# Patient Record
Sex: Male | Born: 1965
Health system: Southern US, Community
[De-identification: ages and names within clinical notes are randomized; demographics above are authoritative.]

## PROBLEM LIST (undated history)

## (undated) DIAGNOSIS — K3189 Other diseases of stomach and duodenum: Secondary | ICD-10-CM

## (undated) DIAGNOSIS — E119 Type 2 diabetes mellitus without complications: Secondary | ICD-10-CM

## (undated) DIAGNOSIS — F4310 Post-traumatic stress disorder, unspecified: Secondary | ICD-10-CM

## (undated) DIAGNOSIS — K922 Gastrointestinal hemorrhage, unspecified: Secondary | ICD-10-CM

## (undated) DIAGNOSIS — K449 Diaphragmatic hernia without obstruction or gangrene: Secondary | ICD-10-CM

## (undated) DIAGNOSIS — K859 Acute pancreatitis without necrosis or infection, unspecified: Secondary | ICD-10-CM

## (undated) DIAGNOSIS — I10 Essential (primary) hypertension: Secondary | ICD-10-CM

## (undated) DIAGNOSIS — I864 Gastric varices: Secondary | ICD-10-CM

## (undated) DIAGNOSIS — K7460 Unspecified cirrhosis of liver: Secondary | ICD-10-CM

## (undated) DIAGNOSIS — G4700 Insomnia, unspecified: Secondary | ICD-10-CM

## (undated) DIAGNOSIS — F1011 Alcohol abuse, in remission: Secondary | ICD-10-CM

## (undated) DIAGNOSIS — R911 Solitary pulmonary nodule: Secondary | ICD-10-CM

## (undated) DIAGNOSIS — F419 Anxiety disorder, unspecified: Secondary | ICD-10-CM

## (undated) DIAGNOSIS — K863 Pseudocyst of pancreas: Secondary | ICD-10-CM

## (undated) DIAGNOSIS — K219 Gastro-esophageal reflux disease without esophagitis: Secondary | ICD-10-CM

## (undated) DIAGNOSIS — R188 Other ascites: Secondary | ICD-10-CM

## (undated) DIAGNOSIS — Z5189 Encounter for other specified aftercare: Secondary | ICD-10-CM

## (undated) DIAGNOSIS — K766 Portal hypertension: Secondary | ICD-10-CM

## (undated) HISTORY — DX: Insomnia, unspecified: G47.00

## (undated) HISTORY — DX: Diaphragmatic hernia without obstruction or gangrene: K44.9

## (undated) HISTORY — PX: SPLENECTOMY: SUR1306

## (undated) HISTORY — PX: CATARACT EXTRACTION: SUR2

## (undated) HISTORY — PX: FEMUR SURGERY: SHX943

## (undated) HISTORY — DX: Encounter for other specified aftercare: Z51.89

---

## 1999-10-15 ENCOUNTER — Emergency Department (HOSPITAL_COMMUNITY): Admission: EM | Admit: 1999-10-15 | Discharge: 1999-10-15 | Payer: Self-pay | Admitting: Emergency Medicine

## 1999-10-15 ENCOUNTER — Encounter: Payer: Self-pay | Admitting: Emergency Medicine

## 2013-12-01 ENCOUNTER — Emergency Department (HOSPITAL_BASED_OUTPATIENT_CLINIC_OR_DEPARTMENT_OTHER): Payer: BC Managed Care – PPO

## 2013-12-01 ENCOUNTER — Emergency Department (HOSPITAL_BASED_OUTPATIENT_CLINIC_OR_DEPARTMENT_OTHER)
Admission: EM | Admit: 2013-12-01 | Discharge: 2013-12-01 | Disposition: A | Discharge status: Other Acute Inpatient Hospital | Payer: BC Managed Care – PPO | Attending: Emergency Medicine | Admitting: Emergency Medicine

## 2013-12-01 ENCOUNTER — Encounter (HOSPITAL_BASED_OUTPATIENT_CLINIC_OR_DEPARTMENT_OTHER): Payer: Self-pay | Admitting: Emergency Medicine

## 2013-12-01 DIAGNOSIS — N390 Urinary tract infection, site not specified: Secondary | ICD-10-CM | POA: Insufficient documentation

## 2013-12-01 DIAGNOSIS — F17200 Nicotine dependence, unspecified, uncomplicated: Secondary | ICD-10-CM | POA: Insufficient documentation

## 2013-12-01 DIAGNOSIS — K859 Acute pancreatitis without necrosis or infection, unspecified: Secondary | ICD-10-CM | POA: Insufficient documentation

## 2013-12-01 DIAGNOSIS — M549 Dorsalgia, unspecified: Secondary | ICD-10-CM | POA: Insufficient documentation

## 2013-12-01 LAB — CBC WITH DIFFERENTIAL/PLATELET
BASOS ABS: 0 10*3/uL (ref 0.0–0.1)
Basophils Relative: 0 % (ref 0–1)
Eosinophils Absolute: 0 10*3/uL (ref 0.0–0.7)
Eosinophils Relative: 0 % (ref 0–5)
HEMATOCRIT: 41.9 % (ref 39.0–52.0)
HEMOGLOBIN: 15.1 g/dL (ref 13.0–17.0)
LYMPHS PCT: 6 % — AB (ref 12–46)
Lymphs Abs: 0.5 10*3/uL — ABNORMAL LOW (ref 0.7–4.0)
MCH: 36.4 pg — ABNORMAL HIGH (ref 26.0–34.0)
MCHC: 36 g/dL (ref 30.0–36.0)
MCV: 101 fL — ABNORMAL HIGH (ref 78.0–100.0)
MONOS PCT: 9 % (ref 3–12)
Monocytes Absolute: 0.7 10*3/uL (ref 0.1–1.0)
NEUTROS ABS: 6.3 10*3/uL (ref 1.7–7.7)
NEUTROS PCT: 85 % — AB (ref 43–77)
Platelets: 106 10*3/uL — ABNORMAL LOW (ref 150–400)
RBC: 4.15 MIL/uL — ABNORMAL LOW (ref 4.22–5.81)
RDW: 13 % (ref 11.5–15.5)
WBC: 7.5 10*3/uL (ref 4.0–10.5)

## 2013-12-01 LAB — URINALYSIS, ROUTINE W REFLEX MICROSCOPIC
GLUCOSE, UA: NEGATIVE mg/dL
Ketones, ur: >80 mg/dL — AB
Nitrite: POSITIVE — AB
PH: 6 (ref 5.0–8.0)
Protein, ur: >300 mg/dL — AB
Urobilinogen, UA: 1 mg/dL (ref 0.0–1.0)

## 2013-12-01 LAB — COMPREHENSIVE METABOLIC PANEL
ALBUMIN: 4.9 g/dL (ref 3.5–5.2)
ALK PHOS: 66 U/L (ref 39–117)
ALT: 81 U/L — AB (ref 0–53)
AST: 120 U/L — AB (ref 0–37)
BUN: 23 mg/dL (ref 6–23)
CHLORIDE: 90 meq/L — AB (ref 96–112)
CO2: 20 mEq/L (ref 19–32)
Calcium: 10.3 mg/dL (ref 8.4–10.5)
Creatinine, Ser: 1 mg/dL (ref 0.50–1.35)
GFR calc Af Amer: >90 mL/min (ref >90)
GFR calc non Af Amer: 88 mL/min — ABNORMAL LOW (ref >90)
Glucose, Bld: 177 mg/dL — ABNORMAL HIGH (ref 70–99)
Potassium: 4.3 mEq/L (ref 3.7–5.3)
SODIUM: 133 meq/L — AB (ref 137–147)
Total Bilirubin: 1.5 mg/dL — ABNORMAL HIGH (ref 0.3–1.2)
Total Protein: 8.4 g/dL — ABNORMAL HIGH (ref 6.0–8.3)

## 2013-12-01 LAB — URINE MICROSCOPIC-ADD ON

## 2013-12-01 LAB — LIPASE, BLOOD

## 2013-12-01 MED ORDER — HYDROMORPHONE HCL PF 2 MG/ML IJ SOLN
INTRAMUSCULAR | Status: AC
  Administered 2013-12-01: 2 mg
  Filled 2013-12-01: qty 1

## 2013-12-01 MED ORDER — DEXTROSE 5 % IV SOLN
1.0000 g | INTRAVENOUS | Status: DC
  Administered 2013-12-01: 1 g via INTRAVENOUS

## 2013-12-01 MED ORDER — CEFTRIAXONE SODIUM 1 G IJ SOLR
INTRAMUSCULAR | Status: AC
  Filled 2013-12-01: qty 10

## 2013-12-01 MED ORDER — IOHEXOL 300 MG/ML  SOLN
50.0000 mL | Freq: Once | INTRAMUSCULAR | Status: AC | PRN
  Administered 2013-12-01: 50 mL via ORAL

## 2013-12-01 MED ORDER — ONDANSETRON HCL 4 MG/2ML IJ SOLN
4.0000 mg | Freq: Once | INTRAMUSCULAR | Status: AC
  Administered 2013-12-01: 4 mg via INTRAVENOUS
  Filled 2013-12-01: qty 2

## 2013-12-01 MED ORDER — SODIUM CHLORIDE 0.9 % IV SOLN
Freq: Once | INTRAVENOUS | Status: AC
  Administered 2013-12-01: 1000 mL via INTRAVENOUS

## 2013-12-01 MED ORDER — HYDROMORPHONE HCL PF 1 MG/ML IJ SOLN
1.0000 mg | Freq: Once | INTRAMUSCULAR | Status: AC
  Administered 2013-12-01: 1 mg via INTRAVENOUS
  Filled 2013-12-01: qty 1

## 2013-12-01 MED ORDER — PANTOPRAZOLE SODIUM 40 MG IV SOLR
40.0000 mg | Freq: Once | INTRAVENOUS | Status: AC
  Administered 2013-12-01: 40 mg via INTRAVENOUS
  Filled 2013-12-01: qty 40

## 2013-12-01 MED ORDER — IOHEXOL 300 MG/ML  SOLN
100.0000 mL | Freq: Once | INTRAMUSCULAR | Status: AC | PRN
  Administered 2013-12-01: 100 mL via INTRAVENOUS

## 2013-12-01 NOTE — ED Notes (Signed)
Epigastric pain since yesterday. Admits to drinking alcohol almost everyday. He has been vomiting green gastric contents.

## 2013-12-01 NOTE — ED Notes (Signed)
Called to RN at [facility]

## 2013-12-01 NOTE — ED Provider Notes (Signed)
CSN: [id number]     Arrival date & time 12/01/13  1758 History   First MD Initiated Contact with Patient 12/01/13 1806     Chief Complaint  Patient presents with  . Abdominal Pain  . Back Pain     (Consider location/radiation/quality/duration/timing/severity/associated sxs/prior Treatment) Patient is a 48 y.o. male presenting with abdominal pain. The history is provided by the patient. No language interpreter was used.  Abdominal Pain Pain location:  Epigastric and LUQ Pain quality: aching and stabbing   Pain radiates to:  Back Pain severity:  Moderate Onset quality:  Gradual Duration:  12 hours Progression:  Worsening Chronicity:  New Context: alcohol use   Relieved by:  Nothing Worsened by:  Nothing tried Associated symptoms: vomiting     History reviewed. No pertinent past medical history. Past Surgical History  Procedure Laterality Date  . Femur surgery     No family history on file. History  Substance Use Topics  . Smoking status: Current Every Day Smoker -- 0.50 packs/day    Types: Cigarettes  . Smokeless tobacco: Not on file  . Alcohol Use: Yes     Comment: drinks liquior everyday.     Review of Systems  Constitutional: Negative.   Respiratory: Negative.   Cardiovascular: Negative.   Gastrointestinal: Positive for vomiting and abdominal pain.      Allergies  Review of patient's allergies indicates no known allergies.  Home Medications   Prior to Admission medications   Not on File   BP 158/99  Pulse 114  Temp(Src) 97.5 F (36.4 C) (Oral)  Resp 20  Ht 6\' 2"  (1.88 m)  Wt 190 lb (86.183 kg)  BMI 24.38 kg/m2  SpO2 100% Physical Exam  Nursing note and vitals reviewed. Constitutional: He is oriented to person, place, and time. He appears well-developed and well-nourished.  HENT:  Head: Normocephalic and atraumatic.  Cardiovascular: Normal rate and regular rhythm.   Pulmonary/Chest: Effort normal and breath sounds normal.  Abdominal: Soft.  Bowel sounds are normal. There is tenderness in the epigastric area and left upper quadrant.  Musculoskeletal: Normal range of motion.  Neurological: He is alert and oriented to person, place, and time. Coordination normal.  Skin: Skin is warm and dry.  Psychiatric: He has a normal mood and affect.    ED Course  Procedures (including critical care time) Labs Review Labs Reviewed  CBC WITH DIFFERENTIAL - Abnormal; Notable for the following:    RBC 4.15 (*)    MCV 101.0 (*)    MCH 36.4 (*)    Platelets 106 (*)    Neutrophils Relative % 85 (*)    Lymphocytes Relative 6 (*)    Lymphs Abs 0.5 (*)    All other components within normal limits  COMPREHENSIVE METABOLIC PANEL - Abnormal; Notable for the following:    Sodium 133 (*)    Chloride 90 (*)    Glucose, Bld 177 (*)    Total Protein 8.4 (*)    AST 120 (*)    ALT 81 (*)    Total Bilirubin 1.5 (*)    GFR calc non Af Amer 88 (*)    All other components within normal limits  LIPASE, BLOOD - Abnormal; Notable for the following:    Lipase >3000 (*)    All other components within normal limits  URINALYSIS, ROUTINE W REFLEX MICROSCOPIC - Abnormal; Notable for the following:    Color, Urine ORANGE (*)    APPearance CLOUDY (*)    Specific Gravity,  Urine >1.046 (*)    Hgb urine dipstick SMALL (*)    Bilirubin Urine SMALL (*)    Ketones, ur >80 (*)    Protein, ur >300 (*)    Nitrite POSITIVE (*)    Leukocytes, UA SMALL (*)    All other components within normal limits  URINE MICROSCOPIC-ADD ON - Abnormal; Notable for the following:    Squamous Epithelial / LPF FEW (*)    Bacteria, UA FEW (*)    Casts HYALINE CASTS (*)    All other components within normal limits    Imaging Review Ct Abdomen Pelvis W Contrast  12/01/2013   CLINICAL DATA:  Abdominal pain with vomiting  EXAM: CT ABDOMEN AND PELVIS WITH CONTRAST  TECHNIQUE: Multidetector CT imaging of the abdomen and pelvis was performed using the standard protocol following bolus  administration of intravenous contrast.  CONTRAST:  50mL OMNIPAQUE IOHEXOL 300 MG/ML SOLN, 100mL OMNIPAQUE IOHEXOL 300 MG/ML SOLN  COMPARISON:  None.  FINDINGS: BODY [place]: Unremarkable.  LOWER CHEST: Unremarkable.  ABDOMEN/PELVIS:  Liver: Diffuse, marked fatty infiltration of the liver.  Biliary: No calcified gallstone.  Pancreas: Diffuse enlargement and hyperdensity of the pancreas, with extensive retroperitoneal ligament edema. No loculated fluid collection. No vascular occlusion or necrosis. Mild adenopathy around the pancreatic head which is likely reactive.  Spleen: Unremarkable.  Adrenals: Unremarkable.  Kidneys and ureters: No hydronephrosis or stone.  Bladder: Unremarkable.  Reproductive: Unremarkable.  Bowel: No obstruction. Normal appendix.  Retroperitoneum: No mass or adenopathy.  Peritoneum: Small volume reactive ascites.  Vascular: No acute abnormality.  OSSEOUS: No acute abnormalities.  IMPRESSION: 1. Acute pancreatitis.  No loculated collection or necrosis. 2. Marked hepatic steatosis.   Electronically Signed   By: Tiburcio PeaJonathan  Watts M.D.   On: 12/01/2013 19:44     EKG Interpretation None      MDM   Final diagnoses:  Acute pancreatitis  UTI (lower urinary tract infection)    Pt to be admitted to [facility]. Dr Eden Emmsariza accepted.    Teressa LowerVrinda Mylz Yuan, NP 12/01/13 2049

## 2013-12-01 NOTE — ED Provider Notes (Signed)
Medical screening examination/treatment/procedure(s) were conducted as a shared visit with non-physician practitioner(s) and myself.  I personally evaluated the patient during the encounter. Patient is a 48 year old male who presents with complaints of severe epigastric pain radiating into his back that started earlier today. He reports nausea and vomiting but denies diarrhea or constipation. He admits to drinking alcohol daily, he states to help him sleep.  On exam, vitals are stable and the patient is afebrile. He appears quite uncomfortable and is diaphoretic. Head is atraumatic, normocephalic. Neck is supple. Heart is tachycardic but regular. There are no murmurs. Lungs are clear. Abdomen reveals tenderness to palpation in the epigastric region with no rebound and no guarding. Extremities are without edema.  Workup reveals a lipase of greater than 3000 and CT scan shows pancreatitis. There is no evidence for biliary obstruction or gallstone. I suspect the etiology of his pancreatitis his alcohol use. He will be admitted to the hospitalist service at [facility].     Geoffery Lyons, MD 12/01/13 2227

## 2013-12-01 NOTE — ED Notes (Signed)
2000cc NSS infused  And saline lock left in place

## 2013-12-03 LAB — URINE CULTURE
Colony Count: NO GROWTH
Culture: NO GROWTH

## 2014-06-29 ENCOUNTER — Encounter (HOSPITAL_BASED_OUTPATIENT_CLINIC_OR_DEPARTMENT_OTHER): Payer: Self-pay | Admitting: *Deleted

## 2014-06-29 ENCOUNTER — Emergency Department (HOSPITAL_BASED_OUTPATIENT_CLINIC_OR_DEPARTMENT_OTHER)
Admission: EM | Admit: 2014-06-29 | Discharge: 2014-06-29 | Disposition: A | Discharge status: Home / Self Care | Payer: BLUE CROSS/BLUE SHIELD | Attending: Emergency Medicine | Admitting: Emergency Medicine

## 2014-06-29 DIAGNOSIS — G4700 Insomnia, unspecified: Secondary | ICD-10-CM | POA: Insufficient documentation

## 2014-06-29 MED ORDER — ZOLPIDEM TARTRATE 5 MG PO TABS: 5.0000 mg | ORAL_TABLET | Freq: Every evening | ORAL | Status: DC | PRN

## 2014-06-29 NOTE — Discharge Instructions (Signed)
Insomnia Insomnia is frequent trouble falling and/or staying asleep. Insomnia can be a long term problem or a short term problem. Both are common. Insomnia can be a short term problem when the wakefulness is related to a certain stress or worry. Long term insomnia is often related to ongoing stress during waking hours and/or poor sleeping habits. Overtime, sleep deprivation itself can make the problem worse. Every little thing feels more severe because you are overtired and your ability to cope is decreased. CAUSES   Stress, anxiety, and depression.  Poor sleeping habits.  Distractions such as TV in the bedroom.  Naps close to bedtime.  Engaging in emotionally charged conversations before bed.  Technical reading before sleep.  Alcohol and other sedatives. They may make the problem worse. They can hurt normal sleep patterns and normal dream activity.  Stimulants such as caffeine for several hours prior to bedtime.  Pain syndromes and shortness of breath can cause insomnia.  Exercise late at night.  Changing time zones may cause sleeping problems (jet lag). It is sometimes helpful to have someone observe your sleeping patterns. They should look for periods of not breathing during the night (sleep apnea). They should also look to see how long those periods last. If you live alone or observers are uncertain, you can also be observed at a sleep clinic where your sleep patterns will be professionally monitored. Sleep apnea requires a checkup and treatment. Give your caregivers your medical history. Give your caregivers observations your family has made about your sleep.  SYMPTOMS   Not feeling rested in the morning.  Anxiety and restlessness at bedtime.  Difficulty falling and staying asleep. TREATMENT   Your caregiver may prescribe treatment for an underlying medical disorders. Your caregiver can give advice or help if you are using alcohol or other drugs for self-medication. Treatment  of underlying problems will usually eliminate insomnia problems.  Medications can be prescribed for short time use. They are generally not recommended for lengthy use.  Over-the-counter sleep medicines are not recommended for lengthy use. They can be habit forming.  You can promote easier sleeping by making lifestyle changes such as:  Using relaxation techniques that help with breathing and reduce muscle tension.  Exercising earlier in the day.  Changing your diet and the time of your last meal. No night time snacks.  Establish a regular time to go to bed.  Counseling can help with stressful problems and worry.  Soothing music and white noise may be helpful if there are background noises you cannot remove.  Stop tedious detailed work at least one hour before bedtime. HOME CARE INSTRUCTIONS   Keep a diary. Inform your caregiver about your progress. This includes any medication side effects. See your caregiver regularly. Take note of:  Times when you are asleep.  Times when you are awake during the night.  The quality of your sleep.  How you feel the next day. This information will help your caregiver care for you.  Get out of bed if you are still awake after 15 minutes. Read or do some quiet activity. Keep the lights down. Wait until you feel sleepy and go back to bed.  Keep regular sleeping and waking hours. Avoid naps.  Exercise regularly.  Avoid distractions at bedtime. Distractions include watching television or engaging in any intense or detailed activity like attempting to balance the household checkbook.  Develop a bedtime ritual. Keep a familiar routine of bathing, brushing your teeth, climbing into bed at the same   time each night, listening to soothing music. Routines increase the success of falling to sleep faster.  Use relaxation techniques. This can be using breathing and muscle tension release routines. It can also include visualizing peaceful scenes. You can  also help control troubling or intruding thoughts by keeping your mind occupied with boring or repetitive thoughts like the old concept of counting sheep. You can make it more creative like imagining planting one beautiful flower after another in your backyard garden.  During your day, work to eliminate stress. When this is not possible use some of the previous suggestions to help reduce the anxiety that accompanies stressful situations. MAKE SURE YOU:   Understand these instructions.  Will watch your condition.  Will get help right away if you are not doing well or get worse. Document Released: 06/08/2000 Document Revised: 09/03/2011 Document Reviewed: 07/09/2007 Exit[employer] Patient Information 2015 [employer]. This information is not intended to replace advice given to you by your health care provider. Make sure you discuss any questions you have with your health care provider.  

## 2014-06-29 NOTE — ED Notes (Signed)
Pt amb to room 6 with quick steady gait in nad. Pt reports no sleep x 5 days. States he has had periods of insomnia in the past, but never this long. States "I'm so tired.Marland Kitchen.Marland Kitchen."

## 2014-06-29 NOTE — ED Provider Notes (Signed)
CSN: [id number]     Arrival date & time 06/29/14  0803 History   First MD Initiated Contact with Patient 06/29/14 0809     Chief Complaint  Patient presents with  . Insomnia      HPI Patient presents to the emergency department complaining of insomnia.  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  He's always had somewhat irregular sleep cycles.  This seems to be worsening.  He states he has not been able to sleep past 4-5 days.  He was brought to the emergency department today by his supervisor.  He states he would like to sleep but he can even fall asleep.  Denies fevers or chills.  Denies stimulant use.  No other significant complaints.  Does not have a primary care physician.   History reviewed. No pertinent past medical history. Past Surgical History  Procedure Laterality Date  . Femur surgery     History reviewed. No pertinent family history. History  Substance Use Topics  . Smoking status: Never Smoker   . Smokeless tobacco: Current User  . Alcohol Use: Yes     Comment: drinks liquior everyday.     Review of Systems  All other systems reviewed and are negative.     Allergies  Review of patient's allergies indicates no known allergies.  Home Medications   Prior to Admission medications   Medication Sig Start Date End Date Taking? Authorizing Provider  zolpidem (AMBIEN) 5 MG tablet Take 1 tablet (5 mg total) by mouth at bedtime as needed for sleep. 06/29/14   Lyanne CoKevin M Randall Rampersad, MD   BP 143/106 mmHg  Pulse 114  Temp(Src) 98.6 F (37 C) (Oral)  Resp 16  Ht 6\' 2"  (1.88 m)  Wt 195 lb (88.451 kg)  BMI 25.03 kg/m2  SpO2 100% Physical Exam  Constitutional: He is oriented to person, place, and time. He appears well-developed and well-nourished.  HENT:  Head: Normocephalic and atraumatic.  Eyes: EOM are normal.  Neck: Normal range of motion.  Cardiovascular: Regular rhythm, normal heart sounds and intact distal pulses.   Pulmonary/Chest: Effort normal and breath sounds normal. No  respiratory distress.  Abdominal: Soft. He exhibits no distension. There is no tenderness.  Musculoskeletal: Normal range of motion.  Neurological: He is alert and oriented to person, place, and time.  Skin: Skin is warm and dry.  Psychiatric: He has a normal mood and affect. Judgment normal.  Nursing note and vitals reviewed.   ED Course  Procedures (including critical care time) Labs Review Labs Reviewed - No data to display  Imaging Review No results found.   EKG Interpretation None      MDM   Final diagnoses:  Insomnia    Patient referred to primary care.  Long discussion had about sleep hygiene.  Short course of Ambien for the patient to try.  He understands to return to the ER for any new or worsening symptoms.  I informed the patient not to drive until his sleep cycle has become regulated.    Lyanne CoKevin M Sierah Lacewell, MD 06/29/14 [phone]

## 2014-06-29 NOTE — ED Notes (Signed)
Pt supervisor is with pt, supervisor is aware that we can do a uds/bat if needed, supervisor states he has spoken with his supervisor and states pt was not yet "on duty" when brought to ed per Asencion IslamMarva, RN.

## 2014-07-05 ENCOUNTER — Ambulatory Visit (INDEPENDENT_AMBULATORY_CARE_PROVIDER_SITE_OTHER): Payer: BLUE CROSS/BLUE SHIELD | Admitting: Physician Assistant

## 2014-07-05 ENCOUNTER — Encounter: Payer: Self-pay | Admitting: Physician Assistant

## 2014-07-05 VITALS — BP 136/80 | HR 90 | Temp 98.9°F | Resp 16 | Ht 74.0 in | Wt 200.0 lb

## 2014-07-05 DIAGNOSIS — G4700 Insomnia, unspecified: Secondary | ICD-10-CM

## 2014-07-05 NOTE — Assessment & Plan Note (Signed)
Markedly improved with decreased home stressors and OTC nightly melatonin.  6-8 hours of restful sleep per night.  Patient encouraged to keep a sleep journal. Exercise before bed to help sleep. Follow-up PRN.

## 2014-07-05 NOTE — Progress Notes (Signed)
Pre visit review using our clinic review tool, if applicable. No additional management support is needed unless otherwise documented below in the visit note/SLS  

## 2014-07-05 NOTE — Progress Notes (Signed)
    Patient presents to clinic today to establish care.  Patient recently seen in ER for insomnia at request of supervisor at work.  Patient is a [employer] architect[employer]ruck driver.  Had experienced a 4-5 day stent without good sleep, culminating in him being somnolent at work.  States supervisor took him to ER for evaluation to make sure it was safe for him to work.  ER workup unremarkable.  Was given Ambien to use as needed for restful sleep.  Endorses he has not taken medication.  Is taking OTC 5 mg Melatonin at night.  Is getting restful sleep.  Has started a sleep diary. Patient denies somnolence, PND or loud-snoring.  Body mass index is 25.67 kg/(m^2)..  BP initially elevated but normotensive on recheck.  Past Medical History  Diagnosis Date  . Insomnia     Past Surgical History  Procedure Laterality Date  . Femur surgery      No current outpatient prescriptions on file prior to visit.   No current facility-administered medications on file prior to visit.    No Known Allergies  Family History  Problem Relation Age of Onset  . Healthy Son     x1    History   Social History  . Marital Status: Divorced    Spouse Name: N/A    Number of Children: N/A  . Years of Education: N/A   Occupational History  . Not on file.   Social History Main Topics  . Smoking status: Light Tobacco Smoker -- 0.50 packs/day    Types: Cigarettes  . Smokeless tobacco: Current User    Types: Snuff  . Alcohol Use: 0.0 oz/week    0 Not specified per week     Comment: drinks liquior everyday.   . Drug Use: No  . Sexual Activity: Not on file   Other Topics Concern  . Not on file   Social History Narrative   ROS Pertinent ROS are listed in HPI.  BP 136/80 mmHg  Pulse 90  Temp(Src) 98.9 F (37.2 C) (Oral)  Resp 16  Ht 6\' 2"  (1.88 m)  Wt 200 lb (90.719 kg)  BMI 25.67 kg/m2  SpO2 100%  Physical Exam  Constitutional: He is oriented to person, place, and time and well-developed, well-nourished, and in no  distress.  HENT:  Head: Normocephalic and atraumatic.  Right Ear: External ear normal.  Left Ear: External ear normal.  Nose: Nose normal.  Mouth/Throat: Oropharynx is clear and moist.  Eyes: Conjunctivae and EOM are normal. Pupils are equal, round, and reactive to light.  Neck: Neck supple.  Cardiovascular: Normal rate, regular rhythm, normal heart sounds and intact distal pulses.   Pulmonary/Chest: Effort normal and breath sounds normal. No respiratory distress. He has no wheezes. He has no rales. He exhibits no tenderness.  Neurological: He is alert and oriented to person, place, and time. No cranial nerve deficit. Coordination normal. GCS score is 15.  Skin: Skin is warm and dry. No rash noted.  Psychiatric: Affect normal.  Vitals reviewed.  Assessment/Plan: Insomnia Markedly improved with decreased home stressors and OTC nightly melatonin.  6-8 hours of restful sleep per night.  Patient encouraged to keep a sleep journal. Exercise before bed to help sleep. Follow-up PRN.

## 2014-07-05 NOTE — Patient Instructions (Signed)
Please continue current measures.  Continue sleep diary.  Follow-up if symptoms recur.

## 2014-07-06 ENCOUNTER — Telehealth: Payer: Self-pay | Admitting: Physician Assistant

## 2014-07-06 NOTE — Telephone Encounter (Signed)
emmi emailed °

## 2014-07-09 ENCOUNTER — Emergency Department (HOSPITAL_BASED_OUTPATIENT_CLINIC_OR_DEPARTMENT_OTHER)
Admission: EM | Admit: 2014-07-09 | Discharge: 2014-07-09 | Disposition: A | Discharge status: Home / Self Care | Payer: BLUE CROSS/BLUE SHIELD | Attending: Emergency Medicine | Admitting: Emergency Medicine

## 2014-07-09 ENCOUNTER — Encounter (HOSPITAL_BASED_OUTPATIENT_CLINIC_OR_DEPARTMENT_OTHER): Payer: Self-pay | Admitting: Emergency Medicine

## 2014-07-09 DIAGNOSIS — G4700 Insomnia, unspecified: Secondary | ICD-10-CM | POA: Diagnosis not present

## 2014-07-09 DIAGNOSIS — Z8719 Personal history of other diseases of the digestive system: Secondary | ICD-10-CM | POA: Diagnosis not present

## 2014-07-09 DIAGNOSIS — R000 Tachycardia, unspecified: Secondary | ICD-10-CM | POA: Diagnosis not present

## 2014-07-09 DIAGNOSIS — R4182 Altered mental status, unspecified: Secondary | ICD-10-CM | POA: Diagnosis present

## 2014-07-09 DIAGNOSIS — E860 Dehydration: Secondary | ICD-10-CM | POA: Diagnosis not present

## 2014-07-09 DIAGNOSIS — Z79899 Other long term (current) drug therapy: Secondary | ICD-10-CM | POA: Insufficient documentation

## 2014-07-09 DIAGNOSIS — Z720 Tobacco use: Secondary | ICD-10-CM | POA: Insufficient documentation

## 2014-07-09 DIAGNOSIS — R69 Illness, unspecified: Secondary | ICD-10-CM

## 2014-07-09 DIAGNOSIS — J111 Influenza due to unidentified influenza virus with other respiratory manifestations: Secondary | ICD-10-CM | POA: Diagnosis not present

## 2014-07-09 HISTORY — DX: Acute pancreatitis without necrosis or infection, unspecified: K85.90

## 2014-07-09 LAB — ETHANOL: Alcohol, Ethyl (B): 261 mg/dL — ABNORMAL HIGH (ref 0–9)

## 2014-07-09 LAB — COMPREHENSIVE METABOLIC PANEL
ALBUMIN: 4.5 g/dL (ref 3.5–5.2)
ALK PHOS: 78 U/L (ref 39–117)
ALT: 33 U/L (ref 0–53)
AST: 84 U/L — ABNORMAL HIGH (ref 0–37)
Anion gap: 19 — ABNORMAL HIGH (ref 5–15)
BUN: 23 mg/dL (ref 6–23)
CO2: 21 mmol/L (ref 19–32)
Calcium: 9 mg/dL (ref 8.4–10.5)
Chloride: 93 mEq/L — ABNORMAL LOW (ref 96–112)
Creatinine, Ser: 1.01 mg/dL (ref 0.50–1.35)
GFR calc Af Amer: >90 mL/min (ref >90)
GFR calc non Af Amer: 86 mL/min — ABNORMAL LOW (ref >90)
GLUCOSE: 117 mg/dL — AB (ref 70–99)
Potassium: 4.1 mmol/L (ref 3.5–5.1)
Sodium: 133 mmol/L — ABNORMAL LOW (ref 135–145)
Total Bilirubin: 0.5 mg/dL (ref 0.3–1.2)
Total Protein: 8.7 g/dL — ABNORMAL HIGH (ref 6.0–8.3)

## 2014-07-09 LAB — CBC
HCT: 40 % (ref 39.0–52.0)
Hemoglobin: 13.6 g/dL (ref 13.0–17.0)
MCH: 33.3 pg (ref 26.0–34.0)
MCHC: 34 g/dL (ref 30.0–36.0)
MCV: 98 fL (ref 78.0–100.0)
Platelets: 325 10*3/uL (ref 150–400)
RBC: 4.08 MIL/uL — ABNORMAL LOW (ref 4.22–5.81)
RDW: 11.9 % (ref 11.5–15.5)
WBC: 4.3 10*3/uL (ref 4.0–10.5)

## 2014-07-09 LAB — URINALYSIS, ROUTINE W REFLEX MICROSCOPIC
Bilirubin Urine: NEGATIVE
Glucose, UA: NEGATIVE mg/dL
Ketones, ur: 40 mg/dL — AB
Leukocytes, UA: NEGATIVE
Nitrite: NEGATIVE
PROTEIN: 30 mg/dL — AB
Specific Gravity, Urine: 1.014 (ref 1.005–1.030)
UROBILINOGEN UA: 0.2 mg/dL (ref 0.0–1.0)
pH: 5.5 (ref 5.0–8.0)

## 2014-07-09 LAB — URINE MICROSCOPIC-ADD ON

## 2014-07-09 LAB — RAPID STREP SCREEN (MED CTR MEBANE ONLY): STREPTOCOCCUS, GROUP A SCREEN (DIRECT): NEGATIVE

## 2014-07-09 LAB — CK: Total CK: 1094 U/L — ABNORMAL HIGH (ref 7–232)

## 2014-07-09 LAB — CBG MONITORING, ED: Glucose-Capillary: 106 mg/dL — ABNORMAL HIGH (ref 70–99)

## 2014-07-09 MED ORDER — SODIUM CHLORIDE 0.9 % IV BOLUS (SEPSIS)
1000.0000 mL | Freq: Once | INTRAVENOUS | Status: AC
  Administered 2014-07-09: 1000 mL via INTRAVENOUS

## 2014-07-09 MED ORDER — ACETAMINOPHEN 325 MG PO TABS: 650.0000 mg | ORAL_TABLET | Freq: Once | ORAL | Status: DC

## 2014-07-09 NOTE — ED Provider Notes (Addendum)
CSN: [id number]     Arrival date & time 07/09/14  1907 History  This chart was scribed for Toy Cookey, MD by Richarda Overlie, ED Scribe. This patient was seen in room MH12/MH12 and the patient's care was started 8:21 PM.      Chief Complaint  Patient presents with  . Altered Mental Status   The history is provided by the patient. No language interpreter was used.   HPI Comments: Samuel Huffman is a 49 y.o. male with a history of insomnia and pancreatitis who presents to the Emergency Department complaining of altered mental status. Per nursing, "Patient reports that he has been in the bed for the last 4 days. The patient reports that he only came because his brother made him. Patient was here 5 days ago for a note to go back to work." He states he has not eaten in the last 4 days. He says that he was feeling extremely cold during this time period. Pt states he just got his voice back about 3 hours and he says he had a sore throat for 1 day. He says he was prescribed Ambien that he "dumped" and has not been taking his melatonin recently. He states he did not receive a flu shot this year. Pt denies any known sick contacts. His brother reports similar prior episodes but states pt has never been laid up in bed for this long before. He denies cough, fever, nausea, vomiting, diarrhea, dysuria and rash.  Past Medical History  Diagnosis Date  . Insomnia   . Pancreatitis    Past Surgical History  Procedure Laterality Date  . Femur surgery     Family History  Problem Relation Age of Onset  . Healthy Son     x1   History  Substance Use Topics  . Smoking status: Light Tobacco Smoker -- 0.50 packs/day    Types: Cigarettes  . Smokeless tobacco: Current User    Types: Snuff  . Alcohol Use: 0.0 oz/week    0 Not specified per week     Comment: drinks liquior everyday.     Review of Systems  Constitutional: Negative for fever, activity change, appetite change and fatigue.  HENT: Negative for  congestion, facial swelling, rhinorrhea and trouble swallowing.   Eyes: Negative for photophobia and pain.  Respiratory: Negative for cough, chest tightness and shortness of breath.   Cardiovascular: Negative for chest pain and leg swelling.  Gastrointestinal: Negative for nausea, vomiting, abdominal pain, diarrhea and constipation.  Endocrine: Negative for polydipsia and polyuria.  Genitourinary: Negative for dysuria, urgency, decreased urine volume and difficulty urinating.  Musculoskeletal: Negative for back pain and gait problem.  Skin: Negative for color change, rash and wound.  Allergic/Immunologic: Negative for immunocompromised state.  Neurological: Negative for dizziness, facial asymmetry, speech difficulty, weakness, numbness and headaches.  Psychiatric/Behavioral: Negative for confusion, decreased concentration and agitation.   Allergies  Review of patient's allergies indicates no known allergies.  Home Medications   Prior to Admission medications   Medication Sig Start Date End Date Taking? Authorizing Provider  MELATONIN GUMMIES PO Take by mouth at bedtime as needed.    Historical Provider, MD  Multiple Vitamins-Minerals (MULTIVITAMIN GUMMIES ADULT) CHEW Chew by mouth daily.    Historical Provider, MD   BP 144/86 mmHg  Pulse 103  Temp(Src) 98.8 F (37.1 C) (Oral)  Resp 18  SpO2 97% Physical Exam  Constitutional: He is oriented to person, place, and time. He appears well-developed and well-nourished. No distress.  HENT:  Head: Normocephalic and atraumatic.  Mouth/Throat: Posterior oropharyngeal erythema present. No oropharyngeal exudate.  Dry mucous membranes.   Eyes: Pupils are equal, round, and reactive to light.  Neck: Normal range of motion. Neck supple.  Cardiovascular: Regular rhythm and normal heart sounds.  Tachycardia present.  Exam reveals no gallop and no friction rub.   No murmur heard. Pulmonary/Chest: Effort normal and breath sounds normal. No  respiratory distress. He has no wheezes. He has no rales.  Abdominal: Soft. Bowel sounds are normal. He exhibits no distension and no mass. There is no tenderness. There is no rebound and no guarding.  Musculoskeletal: Normal range of motion. He exhibits no edema or tenderness.  Neurological: He is alert and oriented to person, place, and time. He displays no atrophy and no tremor. No cranial nerve deficit or sensory deficit. He exhibits normal muscle tone. He displays no seizure activity. Coordination normal. GCS eye subscore is 4. GCS verbal subscore is 5. GCS motor subscore is 6.  Skin: Skin is warm and dry.  Psychiatric: He has a normal mood and affect.    ED Course  Procedures   DIAGNOSTIC STUDIES: Oxygen Saturation is 99% on RA, normal by my interpretation.    COORDINATION OF CARE: 8:33 PM Discussed treatment plan with pt at bedside and pt agreed to plan.   Labs Review Labs Reviewed  CBC - Abnormal; Notable for the following:    RBC 4.08 (*)    All other components within normal limits  COMPREHENSIVE METABOLIC PANEL - Abnormal; Notable for the following:    Sodium 133 (*)    Chloride 93 (*)    Glucose, Bld 117 (*)    Total Protein 8.7 (*)    AST 84 (*)    GFR calc non Af Amer 86 (*)    Anion gap 19 (*)    All other components within normal limits  URINALYSIS, ROUTINE W REFLEX MICROSCOPIC - Abnormal; Notable for the following:    Hgb urine dipstick TRACE (*)    Ketones, ur 40 (*)    Protein, ur 30 (*)    All other components within normal limits  ETHANOL - Abnormal; Notable for the following:    Alcohol, Ethyl (B) 261 (*)    All other components within normal limits  CK - Abnormal; Notable for the following:    Total CK 1094 (*)    All other components within normal limits  URINE MICROSCOPIC-ADD ON - Abnormal; Notable for the following:    Casts GRANULAR CAST (*)    All other components within normal limits  CBG MONITORING, ED - Abnormal; Notable for the following:     Glucose-Capillary 106 (*)    All other components within normal limits  RAPID STREP SCREEN  CULTURE, GROUP A STREP    Imaging Review No results found.   EKG Interpretation None      MDM   Final diagnoses:  Dehydration  Influenza-like illness   Pt is a 49 y.o. male with Pmhx as above who presents with brother for altered mental status.  Patient states that he was stuck in bed for 4 days, then he felt terrible.  He states he was having multiple episodes of severe chills, and body aches.  He was able to get out of bed only to get water and to use the bathroom.  He is not using anything.  He had a sore throat on the first day of the illness but denies coughing, abdominal pain, nausea,  vomiting, diarrhea or urinary symptoms.  On physical exam is tachycardic and has dry mucous membranes.  There is significant for an elevated alcohol level and CK of 1094.  Rapid strep is negative.  No evidence of UTI.  I suspect that patient has a flulike illness given chills and body aches.  When confronted about his alcohol use, patient is told nursing .  He is a history of alcohol abuse and has had some recent psychosocial stressors as his mother has recently moved in with a new boyfriend.  Patient feeling much better after 2 L normal saline and heart rate is normalized.  CK elevation of this level not consistent with rhabdomyolysis and creatinine is normal.  I feel he is safe to be discharged home and have recommended increasing his intake of fluids.  I've given him outpatient resources for drug and alcohol treatment.    Kelan T Ducre evaluation in the Emergency Department is complete. It has been determined that no acute conditions requiring further emergency intervention are present at this time. The patient/guardian have been advised of the diagnosis and plan. We have discussed signs and symptoms that warrant return to the ED, such as changes or worsening in symptoms, worsening pain, fever, inability to  tolerate liquids, decreased urination.    I personally performed the services described in this documentation, which was scribed in my presence. The recorded information has been reviewed and is accurate.       Toy CookeyMegan Docherty, MD 07/09/14 2325  Toy CookeyMegan Docherty, MD 07/09/14 2326

## 2014-07-09 NOTE — ED Notes (Signed)
Found in bed by brother today.  Brother states has been there x 4 days and was unable to speak at first.

## 2014-07-09 NOTE — ED Notes (Signed)
Patient reports that he has been in the bed since tues afternoon. The patient reports that he only came because his brother made him. Patient was here Monday for a note to go back to work. The patient reports that he was taking unisom and was having seizures.

## 2014-07-09 NOTE — Discharge Instructions (Signed)
Influenza Influenza ("the flu") is a viral infection of the respiratory tract. It occurs more often in winter months because people spend more time in close contact with one another. Influenza can make you feel very sick. Influenza easily spreads from person to person (contagious). CAUSES  Influenza is caused by a virus that infects the respiratory tract. You can catch the virus by breathing in droplets from an infected person's cough or sneeze. You can also catch the virus by touching something that was recently contaminated with the virus and then touching your mouth, nose, or eyes. RISKS AND COMPLICATIONS You may be at risk for a more severe case of influenza if you smoke cigarettes, have diabetes, have chronic heart disease (such as heart failure) or lung disease (such as asthma), or if you have a weakened immune system. Elderly people and pregnant women are also at risk for more serious infections. The most common problem of influenza is a lung infection (pneumonia). Sometimes, this problem can require emergency medical care and may be life threatening. SIGNS AND SYMPTOMS  Symptoms typically last 4 to 10 days and may include:  Fever.  Chills.  Headache, body aches, and muscle aches.  Sore throat.  Chest discomfort and cough.  Poor appetite.  Weakness or feeling tired.  Dizziness.  Nausea or vomiting. DIAGNOSIS  Diagnosis of influenza is often made based on your history and a physical exam. A nose or throat swab test can be done to confirm the diagnosis. TREATMENT  In mild cases, influenza goes away on its own. Treatment is directed at relieving symptoms. For more severe cases, your health care provider may prescribe antiviral medicines to shorten the sickness. Antibiotic medicines are not effective because the infection is caused by a virus, not by bacteria. HOME CARE INSTRUCTIONS  Take medicines only as directed by your health care provider.  Use a cool mist humidifier to make  breathing easier.  Get plenty of rest until your temperature returns to normal. This usually takes 3 to 4 days.  Drink enough fluid to keep your urine clear or pale yellow.  Cover yourmouth and nosewhen coughing or sneezing,and wash your handswellto prevent thevirusfrom spreading.  Stay homefromwork orschool untilthe fever is gonefor at least 85full day. PREVENTION  An annual influenza vaccination (flu shot) is the best way to avoid getting influenza. An annual flu shot is now routinely recommended for all adults in the [place] SEEK MEDICAL CARE IF:  You experiencechest pain, yourcough worsens,or you producemore mucus.  Youhave nausea,vomiting, ordiarrhea.  Your fever returns or gets worse. SEEK IMMEDIATE MEDICAL CARE IF:  You havetrouble breathing, you become short of breath,or your skin ornails becomebluish.  You have severe painor stiffnessin the neck.  You develop a sudden headache, or pain in the face or ear.  You have nausea or vomiting that you cannot control. MAKE SURE YOU:   Understand these instructions.  Will watch your condition.  Will get help right away if you are not doing well or get worse. Document Released: 06/08/2000 Document Revised: 10/26/2013 Document Reviewed: 09/10/2011 [facility] Patient Information 2015 [place], [place]. This information is not intended to replace advice given to you by your health care provider. Make sure you discuss any questions you have with your health care provider.   Dehydration, Adult Dehydration is when you lose more fluids from the body than you take in. Vital organs like the kidneys, brain, and heart cannot function without a proper amount of fluids and salt. Any loss of fluids from  the body can cause dehydration.  CAUSES   Vomiting.  Diarrhea.  Excessive sweating.  Excessive urine output.  Fever. SYMPTOMS  Mild dehydration  Thirst.  Dry lips.  Slightly dry mouth. Moderate  dehydration  Very dry mouth.  Sunken eyes.  Skin does not bounce back quickly when lightly pinched and released.  Dark urine and decreased urine production.  Decreased tear production.  Headache. Severe dehydration  Very dry mouth.  Extreme thirst.  Rapid, weak pulse (more than 100 beats per minute at rest).  Cold hands and feet.  Not able to sweat in spite of heat and temperature.  Rapid breathing.  Blue lips.  Confusion and lethargy.  Difficulty being awakened.  Minimal urine production.  No tears. DIAGNOSIS  Your caregiver will diagnose dehydration based on your symptoms and your exam. Blood and urine tests will help confirm the diagnosis. The diagnostic evaluation should also identify the cause of dehydration. TREATMENT  Treatment of mild or moderate dehydration can often be done at home by increasing the amount of fluids that you drink. It is best to drink small amounts of fluid more often. Drinking too much at one time can make vomiting worse. Refer to the home care instructions below. Severe dehydration needs to be treated at the hospital where you will probably be given intravenous (IV) fluids that contain water and electrolytes. HOME CARE INSTRUCTIONS   Ask your caregiver about specific rehydration instructions.  Drink enough fluids to keep your urine clear or pale yellow.  Drink small amounts frequently if you have nausea and vomiting.  Eat as you normally do.  Avoid:  Foods or drinks high in sugar.  Carbonated drinks.  Juice.  Extremely hot or cold fluids.  Drinks with caffeine.  Fatty, greasy foods.  Alcohol.  Tobacco.  Overeating.  Gelatin desserts.  Wash your hands well to avoid spreading bacteria and viruses.  Only take over-the-counter or prescription medicines for pain, discomfort, or fever as directed by your caregiver.  Ask your caregiver if you should continue all prescribed and over-the-counter medicines.  Keep all  follow-up appointments with your caregiver. SEEK MEDICAL CARE IF:  You have abdominal pain and it increases or stays in one area (localizes).  You have a rash, stiff neck, or severe headache.  You are irritable, sleepy, or difficult to awaken.  You are weak, dizzy, or extremely thirsty. SEEK IMMEDIATE MEDICAL CARE IF:   You are unable to keep fluids down or you get worse despite treatment.  You have frequent episodes of vomiting or diarrhea.  You have blood or green matter (bile) in your vomit.  You have blood in your stool or your stool looks black and tarry.  You have not urinated in 6 to 8 hours, or you have only urinated a small amount of very dark urine.  You have a fever.  You faint. MAKE SURE YOU:   Understand these instructions.  Will watch your condition.  Will get help right away if you are not doing well or get worse. Document Released: 06/11/2005 Document Revised: 09/03/2011 Document Reviewed: 01/29/2011 [facility] Patient Information 2015 [place], [place]. This information is not intended to replace advice given to you by your health care provider. Make sure you discuss any questions you have with your health care provider.   Emergency Department Resource Guide 1) Find a Doctor and Pay Out of Pocket Although you won't have to find out who is covered by your insurance plan, it is a good idea to ask around  and get recommendations. You will then need to call the office and see if the doctor you have chosen will accept you as a new patient and what types of options they offer for patients who are self-pay. Some doctors offer discounts or will set up payment plans for their patients who do not have insurance, but you will need to ask so you aren't surprised when you get to your appointment.  2) Contact Your Local Health Department Not all health departments have doctors that can see patients for sick visits, but many do, so it is worth a call to see if yours does. If you  don't know where your local health department is, you can check in your phone book. The CDC also has a tool to help you locate your state's health department, and many state websites also have listings of all of their local health departments.  3) Find a Walk-in Clinic If your illness is not likely to be very severe or complicated, you may want to try a walk in clinic. These are popping up all over the country in pharmacies, drugstores, and shopping centers. They're usually staffed by nurse practitioners or physician assistants that have been trained to treat common illnesses and complaints. They're usually fairly quick and inexpensive. However, if you have serious medical issues or chronic medical problems, these are probably not your best option.  No Primary Care Doctor: - Call Health Connect at  [phone] - they can help you locate a primary care doctor that  accepts your insurance, provides certain services, etc. - Physician Referral Service- [phone]  Chronic Pain Problems: Organization         Address  Phone   Notes  [facility]  [phone] Patients need to be referred by their primary care doctor.   Medication Assistance: Organization         Address  Phone   Notes  [facility] Medication [facility] [address] [place] [place], [place] [address] [phone] --Must be a resident of [facility] -- Must have NO insurance coverage whatsoever (no Medicaid/ Medicare, etc.) -- The pt. MUST have a primary care doctor that directs their care regularly and follows them in the community   [facility]ist  [phone]   [employer]  [phone]    Agencies that provide inexpensive medical care: Organization         Address  Phone   Notes  [facility]  [phone]   [facility]    [phone]   [facility] [address] [place], [place] [address] [phone]   [facility] [address] [place] [place] [phone]   Planned Parenthood    [phone]   [facility]    [phone]   [facility]  [address], [place] Phone:  [phone], Fax:  [phone] Hours of Operation:  9 am - 6 pm, M-F.  Also accepts Medicaid/Medicare and self-pay.  [facility]  [address], [place] Phone: [phone], Fax: [phone]. Hours of Operation:  8:30 am - 5:30 pm, M-F.  Also accepts Medicaid and self-pay.  [facility] [place] [address], [place] Phone: [phone]   [facility] [address] Natasha Bence [place], [place] [phone], Ext. [phone] Mondays & Thursdays: 7-9 AM.  First 15 patients are seen on  a first come, first serve basis.    Medicaid-accepting [facility] Providers:  Organization         Address  Phone   Notes  [facility] [address], [place] [phone] Also accepts self-pay patients.  [facility] [address] Laurell Josephs [place], [place]  [phone]   [facility] [address], [place] [phone]   [facility] [address] [place] [phone]   Renaye Rakers [address], [place]   [phone] Only accepts [place] patients after they have their name applied to their card.   Self-Pay (no insurance) in [facility]:  Organization         Address  Phone   Notes  Sickle Cell Patients, [facility] Medicine [address] [place], [place] [phone]   [facility] [address] [place], [place] [phone]   [facility] [place]  [address], [place] [phone]   [facility]/Dr. Osei-Bonsu  [address], [place] or [address], [place] [phone] Phone  number for both [place] and [place] locations is the same.  [facility] [address] [place] [phone]   [facility] [address], [place] or [address] [phone]   [facility] [address] [place] [phone], phone; [phone], fax Sees patients 1st and 3rd Saturday of every month.  Must not qualify for public or private insurance (i.e. Medicaid, Medicare, [place] Health Choice, Veterans' Benefits)  Household income should be no more than 200% of the poverty level The clinic cannot treat you if you are pregnant or think you are pregnant  Sexually transmitted diseases are not treated at the clinic.    Dental Care: Organization         Address  Phone  Notes  [facility] [address] [place], [place] [phone] Accepts children up to age 78 who are enrolled in [place] or [place] Health Choice; pregnant women with a Medicaid card; and children who have applied for Medicaid or [place] Health Choice, but were declined, whose parents can pay a reduced fee at time of service.  [facility]  [address], [place] [phone] Accepts children up to age 39 who are enrolled in [place] or [place] Health Choice; pregnant women with a Medicaid card; and children who have applied for Medicaid or [place] Health Choice, but were declined, whose parents can pay a reduced fee at time of service.  [facility]  [address] [place], [place] [phone] Patients are seen by appointment only. Walk-ins are not accepted. [facility] will see patients 41 years of age and older. Monday - Tuesday (8am-5pm) Most Wednesdays (8:30-5pm) $30 per visit, cash only  [facility] Adult Dental Access PRO[facility]  [address], [facility] [phone] Patients are seen by appointment only.  Walk-ins are not accepted. [facility] will see patients 77 years of age and older. One Wednesday Evening (Monthly: Volunteer Based).  $30 per visit, cash only  [employer]  [phone] for adults; Children under age 76, call Graduate Pediatric Dentistry at [phone]. Children  aged 73-14, please call [phone] to request a pediatric application.  Dental services are provided in all areas of dental care including fillings, crowns and bridges, complete and partial dentures, implants, gum treatment, root canals, and extractions. Preventive care is also provided. Treatment is provided to both adults and children. Patients are selected via a lottery and there is often a waiting list.   [facility] [address], [place]  [phone] [email or web address]   [facility] [address] [place], [place] [phone], Ext. [phone] Second and Fourth Thursday of each month, opens at 6:30 AM; Clinic ends at 9 AM.  Patients are seen on a first-come first-served basis, and a limited number are seen during each clinic.   [facility]  [address] Ether Griffins [place], [place] [phone]   Eligibility Requirements You must have lived in [place], [place], or [place] counties for at least the last three months.   You cannot be eligible for state or federal sponsored [place], including [employer], [place], or [employer].   You generally cannot be eligible for healthcare insurance through your employer.    How to apply: Eligibility screenings are held every Tuesday and Wednesday afternoon from 1:00 pm until 4:00 pm. You do not need an appointment for the interview!  [facility] [address], [place], [place] [phone]   [facility] Health Department  [phone]   [facility]  [phone]   [facility] Health Department  [phone]    Behavioral Health  Resources in the Community: Intensive Outpatient Programs Organization         Address  Phone  Notes  [facility] [address] [place], [place] [phone]   [facility] [address], [place], [place] [phone]   [facility] [address], [place], [place]  [phone]   [facility] Mental Health [address],  [place], [place] [phone] or [phone]   Substance Abuse Resources Organization         Address  Phone  Notes  [facility] Drug Services  [phone]   [facility]  [phone]   [employer] [place]  [phone]   Floydene Flock  [phone]   Residential & Outpatient Substance Abuse Program  [phone]   Psychological Services Organization         Address  Phone  Notes  [facility] Behavioral Health  [phone]   [facility]  [phone]   [facility] Mental Health [address], [place] [phone] or [phone]    Mobile Crisis Teams Organization         Address  Phone  Notes  [facility], Mobile Crisis Care Unit  [phone]   [facility]  [address] [place], [place] [phone]   Doristine Locks [address] [place] [phone]    Self-Help/Support Groups Organization         Address  Phone             Notes  [facility] [place] - variety of support groups  [phone]- I7437963 Call for more information  Narcotics Anonymous (NA), [facility] [address], [employer] [place]  2 meetings at this location   Statistician         Address  Phone  Notes  [facility] [address],    [place]  [phone]  [facility]  [address], [place] [phone], [place], [place] [phone]   [facility] [address], [place] [phone] Admissions: 8am-3pm M-F  [facility] [address]    [place], [place] [phone]   [facility] [address], [place], [place] [phone]   [facility] [address]  [place], [place] [phone]   [facility] - Intensive Outpatient [address], [place], [place] [phone]   [facility] ([facility] [address]  [place], [place] [phone] or [phone]   [facility] ([facility]) [address] [place], [place] [phone] Accepts Medicaid  [facility] [address]  [place] [phone] Substance Abuse/Addiction Treatment   [facility] Behavioral Health Resources Organization         Address  Phone  Notes  [facility]  [phone]   Angie FavaJulie Brannon, PhD [address] [place], [place]   [phone]) [phone] or [phone]) [phone]   [facility]   [address] [place], [place] [phone]) [phone]   [facility] [address], [place], [place] [phone]) [phone] Insurance/Medicaid/sponsorship through [facility]  [facility] [address]                                    [place], [place] [phone]) [phone] Therapy/tele-psych/case  [facility] [address]   [place], [place] [phone]) [phone]    Dr. Lolly MustacheArfeen  [phone]) [phone]   [facility]  [employer] [facility] 1) [address], [place] 2) [address], [place] 3)  [address], [place] [phone]) [phone]  [phone](336) [phone]   [facility] Child Abuse Hotline [phone] or [phone]) [phone] (After Hours)

## 2014-07-09 NOTE — ED Notes (Signed)
Patient states he feels much better since getting i.v.

## 2014-07-12 LAB — CULTURE, GROUP A STREP

## 2014-10-01 ENCOUNTER — Inpatient Hospital Stay (HOSPITAL_COMMUNITY)
Admission: EM | Admit: 2014-10-01 | Discharge: 2014-10-03 | DRG: 439 | Disposition: A | Discharge status: Home / Self Care | Payer: BLUE CROSS/BLUE SHIELD | Attending: Internal Medicine | Admitting: Internal Medicine

## 2014-10-01 ENCOUNTER — Encounter (HOSPITAL_COMMUNITY): Payer: Self-pay | Admitting: Emergency Medicine

## 2014-10-01 ENCOUNTER — Inpatient Hospital Stay (HOSPITAL_COMMUNITY): Payer: BLUE CROSS/BLUE SHIELD

## 2014-10-01 DIAGNOSIS — IMO0002 Reserved for concepts with insufficient information to code with codable children: Secondary | ICD-10-CM

## 2014-10-01 DIAGNOSIS — K859 Acute pancreatitis without necrosis or infection, unspecified: Secondary | ICD-10-CM

## 2014-10-01 DIAGNOSIS — N179 Acute kidney failure, unspecified: Secondary | ICD-10-CM | POA: Diagnosis present

## 2014-10-01 DIAGNOSIS — E8729 Other acidosis: Secondary | ICD-10-CM

## 2014-10-01 DIAGNOSIS — F17220 Nicotine dependence, chewing tobacco, uncomplicated: Secondary | ICD-10-CM | POA: Diagnosis present

## 2014-10-01 DIAGNOSIS — F1021 Alcohol dependence, in remission: Secondary | ICD-10-CM | POA: Diagnosis not present

## 2014-10-01 DIAGNOSIS — F1010 Alcohol abuse, uncomplicated: Secondary | ICD-10-CM | POA: Diagnosis present

## 2014-10-01 DIAGNOSIS — R739 Hyperglycemia, unspecified: Secondary | ICD-10-CM | POA: Diagnosis present

## 2014-10-01 DIAGNOSIS — E872 Acidosis, unspecified: Secondary | ICD-10-CM | POA: Diagnosis present

## 2014-10-01 DIAGNOSIS — R109 Unspecified abdominal pain: Secondary | ICD-10-CM

## 2014-10-01 DIAGNOSIS — I10 Essential (primary) hypertension: Secondary | ICD-10-CM | POA: Diagnosis present

## 2014-10-01 DIAGNOSIS — IMO0001 Reserved for inherently not codable concepts without codable children: Secondary | ICD-10-CM | POA: Diagnosis present

## 2014-10-01 DIAGNOSIS — K861 Other chronic pancreatitis: Secondary | ICD-10-CM | POA: Diagnosis present

## 2014-10-01 DIAGNOSIS — E785 Hyperlipidemia, unspecified: Secondary | ICD-10-CM | POA: Diagnosis present

## 2014-10-01 DIAGNOSIS — E876 Hypokalemia: Secondary | ICD-10-CM | POA: Diagnosis present

## 2014-10-01 DIAGNOSIS — F1011 Alcohol abuse, in remission: Secondary | ICD-10-CM | POA: Diagnosis present

## 2014-10-01 DIAGNOSIS — R030 Elevated blood-pressure reading, without diagnosis of hypertension: Secondary | ICD-10-CM

## 2014-10-01 HISTORY — DX: Essential (primary) hypertension: I10

## 2014-10-01 LAB — BASIC METABOLIC PANEL
Anion gap: 6 (ref 5–15)
BUN: 19 mg/dL (ref 6–23)
CALCIUM: 7.9 mg/dL — AB (ref 8.4–10.5)
CHLORIDE: 99 mmol/L (ref 96–112)
CO2: 33 mmol/L — ABNORMAL HIGH (ref 19–32)
CREATININE: 0.92 mg/dL (ref 0.50–1.35)
GFR calc Af Amer: >90 mL/min (ref >90)
GFR calc non Af Amer: >90 mL/min (ref >90)
Glucose, Bld: 162 mg/dL — ABNORMAL HIGH (ref 70–99)
Potassium: 3.5 mmol/L (ref 3.5–5.1)
Sodium: 138 mmol/L (ref 135–145)

## 2014-10-01 LAB — COMPREHENSIVE METABOLIC PANEL
ALK PHOS: 73 U/L (ref 39–117)
ANION GAP: 18 — AB (ref 5–15)
AST: 79 U/L — ABNORMAL HIGH (ref 0–37)
Albumin: 4.2 g/dL (ref 3.5–5.2)
BILIRUBIN TOTAL: 1 mg/dL (ref 0.3–1.2)
BUN: 23 mg/dL (ref 6–23)
CHLORIDE: 93 mmol/L — AB (ref 96–112)
CO2: 25 mmol/L (ref 19–32)
Calcium: 9.3 mg/dL (ref 8.4–10.5)
Creatinine, Ser: 1.37 mg/dL — ABNORMAL HIGH (ref 0.50–1.35)
GFR calc non Af Amer: 60 mL/min — ABNORMAL LOW (ref >90)
GFR, EST AFRICAN AMERICAN: 69 mL/min — AB (ref >90)
GLUCOSE: 203 mg/dL — AB (ref 70–99)
POTASSIUM: 3.8 mmol/L (ref 3.5–5.1)
SODIUM: 136 mmol/L (ref 135–145)
Total Protein: 7.8 g/dL (ref 6.0–8.3)

## 2014-10-01 LAB — URINALYSIS, ROUTINE W REFLEX MICROSCOPIC
Bilirubin Urine: NEGATIVE
GLUCOSE, UA: NEGATIVE mg/dL
HGB URINE DIPSTICK: NEGATIVE
Ketones, ur: 15 mg/dL — AB
LEUKOCYTES UA: NEGATIVE
Nitrite: NEGATIVE
Protein, ur: 30 mg/dL — AB
SPECIFIC GRAVITY, URINE: 1.028 (ref 1.005–1.030)
Urobilinogen, UA: 0.2 mg/dL (ref 0.0–1.0)
pH: 5.5 (ref 5.0–8.0)

## 2014-10-01 LAB — CBC WITH DIFFERENTIAL/PLATELET
Basophils Absolute: 0 10*3/uL (ref 0.0–0.1)
Basophils Relative: 0 % (ref 0–1)
EOS PCT: 0 % (ref 0–5)
Eosinophils Absolute: 0 10*3/uL (ref 0.0–0.7)
HEMATOCRIT: 43.2 % (ref 39.0–52.0)
HEMOGLOBIN: 14.8 g/dL (ref 13.0–17.0)
LYMPHS ABS: 0.8 10*3/uL (ref 0.7–4.0)
LYMPHS PCT: 6 % — AB (ref 12–46)
MCH: 34.1 pg — ABNORMAL HIGH (ref 26.0–34.0)
MCHC: 34.3 g/dL (ref 30.0–36.0)
MCV: 99.5 fL (ref 78.0–100.0)
MONO ABS: 0.9 10*3/uL (ref 0.1–1.0)
MONOS PCT: 7 % (ref 3–12)
NEUTROS ABS: 11.7 10*3/uL — AB (ref 1.7–7.7)
Neutrophils Relative %: 87 % — ABNORMAL HIGH (ref 43–77)
Platelets: 288 10*3/uL (ref 150–400)
RBC: 4.34 MIL/uL (ref 4.22–5.81)
RDW: 12.5 % (ref 11.5–15.5)
WBC: 13.5 10*3/uL — AB (ref 4.0–10.5)

## 2014-10-01 LAB — RAPID URINE DRUG SCREEN, HOSP PERFORMED
AMPHETAMINES: NOT DETECTED
BENZODIAZEPINES: NOT DETECTED
Barbiturates: NOT DETECTED
COCAINE: NOT DETECTED
OPIATES: NOT DETECTED
Tetrahydrocannabinol: NOT DETECTED

## 2014-10-01 LAB — CK: CK TOTAL: 68 U/L (ref 7–232)

## 2014-10-01 LAB — LACTIC ACID, PLASMA
LACTIC ACID, VENOUS: 4.7 mmol/L — AB (ref 0.5–2.0)
Lactic Acid, Venous: 2 mmol/L (ref 0.5–2.0)

## 2014-10-01 LAB — I-STAT TROPONIN, ED: Troponin i, poc: 0 ng/mL (ref 0.00–0.08)

## 2014-10-01 LAB — URINE MICROSCOPIC-ADD ON

## 2014-10-01 LAB — AMYLASE: Amylase: 267 U/L — ABNORMAL HIGH (ref 0–105)

## 2014-10-01 LAB — HIV ANTIBODY (ROUTINE TESTING W REFLEX): HIV Screen 4th Generation wRfx: NONREACTIVE

## 2014-10-01 LAB — LIPASE, BLOOD: LIPASE: 364 U/L — AB (ref 11–59)

## 2014-10-01 LAB — ETHANOL: Alcohol, Ethyl (B): <5 mg/dL (ref 0–9)

## 2014-10-01 MED ORDER — SODIUM CHLORIDE 0.9 % IV SOLN: Freq: Once | INTRAVENOUS | Status: DC

## 2014-10-01 MED ORDER — SODIUM CHLORIDE 0.9 % IV SOLN: INTRAVENOUS | Status: DC

## 2014-10-01 MED ORDER — ONDANSETRON HCL 4 MG PO TABS: 4.0000 mg | ORAL_TABLET | Freq: Four times a day (QID) | ORAL | Status: DC | PRN

## 2014-10-01 MED ORDER — ONDANSETRON HCL 4 MG/2ML IJ SOLN
4.0000 mg | Freq: Once | INTRAMUSCULAR | Status: AC
  Administered 2014-10-01: 4 mg via INTRAVENOUS
  Filled 2014-10-01: qty 2

## 2014-10-01 MED ORDER — ENOXAPARIN SODIUM 40 MG/0.4ML ~~LOC~~ SOLN
40.0000 mg | SUBCUTANEOUS | Status: DC
  Administered 2014-10-01 – 2014-10-02 (×2): 40 mg via SUBCUTANEOUS
  Filled 2014-10-01 (×2): qty 0.4

## 2014-10-01 MED ORDER — SODIUM CHLORIDE 0.9 % IV BOLUS (SEPSIS)
1000.0000 mL | Freq: Once | INTRAVENOUS | Status: AC
  Administered 2014-10-01: 1000 mL via INTRAVENOUS

## 2014-10-01 MED ORDER — HYDROMORPHONE HCL 1 MG/ML IJ SOLN
1.0000 mg | Freq: Once | INTRAMUSCULAR | Status: AC
  Administered 2014-10-01: 1 mg via INTRAVENOUS
  Filled 2014-10-01: qty 1

## 2014-10-01 MED ORDER — ONDANSETRON HCL 4 MG/2ML IJ SOLN
4.0000 mg | Freq: Four times a day (QID) | INTRAMUSCULAR | Status: DC | PRN
  Administered 2014-10-01: 4 mg via INTRAVENOUS
  Filled 2014-10-01: qty 2

## 2014-10-01 MED ORDER — HYDROMORPHONE HCL 1 MG/ML IJ SOLN
1.0000 mg | INTRAMUSCULAR | Status: DC | PRN
  Administered 2014-10-01 – 2014-10-02 (×7): 1 mg via INTRAVENOUS
  Filled 2014-10-01 (×7): qty 1

## 2014-10-01 MED ORDER — LORAZEPAM 2 MG/ML IJ SOLN: 0.0000 mg | Freq: Two times a day (BID) | INTRAMUSCULAR | Status: DC

## 2014-10-01 MED ORDER — THIAMINE HCL 100 MG/ML IJ SOLN
100.0000 mg | Freq: Every day | INTRAMUSCULAR | Status: DC
  Administered 2014-10-01 – 2014-10-02 (×2): 100 mg via INTRAVENOUS
  Filled 2014-10-01 (×2): qty 1

## 2014-10-01 MED ORDER — SODIUM CHLORIDE 0.9 % IV SOLN
INTRAVENOUS | Status: DC
  Administered 2014-10-01 – 2014-10-02 (×3): via INTRAVENOUS

## 2014-10-01 MED ORDER — IOHEXOL 350 MG/ML SOLN: 80.0000 mL | Freq: Once | INTRAVENOUS | Status: AC | PRN

## 2014-10-01 MED ORDER — LORAZEPAM 1 MG PO TABS: 0.0000 mg | ORAL_TABLET | Freq: Four times a day (QID) | ORAL | Status: DC

## 2014-10-01 MED ORDER — SODIUM CHLORIDE 0.9 % IJ SOLN
3.0000 mL | Freq: Two times a day (BID) | INTRAMUSCULAR | Status: DC
  Administered 2014-10-02 – 2014-10-03 (×3): 3 mL via INTRAVENOUS

## 2014-10-01 MED ORDER — LORAZEPAM 2 MG/ML IJ SOLN: 0.0000 mg | Freq: Four times a day (QID) | INTRAMUSCULAR | Status: DC

## 2014-10-01 MED ORDER — FENTANYL CITRATE 0.05 MG/ML IJ SOLN
50.0000 ug | Freq: Once | INTRAMUSCULAR | Status: AC
Start: 2014-10-01 — End: 2014-10-01
  Administered 2014-10-01: 50 ug via INTRAVENOUS
  Filled 2014-10-01: qty 2

## 2014-10-01 MED ORDER — DIPHENHYDRAMINE HCL 50 MG/ML IJ SOLN
12.5000 mg | Freq: Once | INTRAMUSCULAR | Status: AC
  Administered 2014-10-01: 12.5 mg via INTRAVENOUS
  Filled 2014-10-01: qty 1

## 2014-10-01 MED ORDER — LORAZEPAM 1 MG PO TABS: 0.0000 mg | ORAL_TABLET | Freq: Two times a day (BID) | ORAL | Status: DC

## 2014-10-01 MED ORDER — MORPHINE SULFATE 2 MG/ML IJ SOLN
2.0000 mg | INTRAMUSCULAR | Status: DC | PRN
  Administered 2014-10-01: 2 mg via INTRAVENOUS
  Filled 2014-10-01: qty 1

## 2014-10-01 NOTE — ED Notes (Signed)
Pt arrives via [facility] from home, states he started having severe abd pain yesterday at 0900. 9/10, sharp and aching, starting in midsection and radiating to back. Hx of pancreatitis.

## 2014-10-01 NOTE — ED Provider Notes (Signed)
CSN: [id number]     Arrival date & time 10/01/14  0425 History   First MD Initiated Contact with Patient 10/01/14 0444     Chief Complaint  Patient presents with  . Abdominal Pain     (Consider location/radiation/quality/duration/timing/severity/associated sxs/prior Treatment) HPI 49 year old male presents emergency department with complaint of severe abdominal pain since 9 AM yesterday.  He reports to numerous to count episodes of vomiting.  Patient reports symptoms are similar to his prior episode of pancreatitis last July.  He emphatically denies any alcohol since having his last episode of pancreatitis.  He denies any fever or chills.  No bowel movement for 2 days.  He reports he has been unable to keep down anything.  Patient does not have a current primary care doctor.  Prior pancreatitis thought to be secondary to alcohol abuse.  Past Medical History  Diagnosis Date  . Insomnia   . Pancreatitis    Past Surgical History  Procedure Laterality Date  . Femur surgery     Family History  Problem Relation Age of Onset  . Healthy Son     x1   History  Substance Use Topics  . Smoking status: Former Smoker -- 0.50 packs/day    Types: Cigarettes  . Smokeless tobacco: Current User    Types: Snuff  . Alcohol Use: 0.0 oz/week    0 Standard drinks or equivalent per week     Comment: drinks liquior everyday.     Review of Systems   See History of Present Illness; otherwise all other systems are reviewed and negative  Allergies  Review of patient's allergies indicates no known allergies.  Home Medications   Prior to Admission medications   Not on File   BP 152/106 mmHg  Pulse 115  Temp(Src) 98.3 F (36.8 C) (Oral)  Resp 22  Ht 6\' 2"  (1.88 m)  Wt 190 lb (86.183 kg)  BMI 24.38 kg/m2  SpO2 96% Physical Exam  Constitutional: He is oriented to person, place, and time. He appears well-developed and well-nourished. He appears distressed.  HENT:  Head: Normocephalic and  atraumatic.  Nose: Nose normal.  Mouth/Throat: Oropharynx is clear and moist.  Eyes: Conjunctivae and EOM are normal. Pupils are equal, round, and reactive to light.  Neck: Normal range of motion. Neck supple. No JVD present. No tracheal deviation present. No thyromegaly present.  Cardiovascular: Regular rhythm, normal heart sounds and intact distal pulses.  Exam reveals no gallop and no friction rub.   No murmur heard. Tachycardia  Pulmonary/Chest: Effort normal and breath sounds normal. No stridor. No respiratory distress. He has no wheezes. He has no rales. He exhibits no tenderness.  Abdominal: Soft. Bowel sounds are normal. He exhibits no distension and no mass. There is tenderness (patient has diffuse tenderness worse in epigastrium). There is no rebound and no guarding.  Musculoskeletal: Normal range of motion. He exhibits no edema or tenderness.  Lymphadenopathy:    He has no cervical adenopathy.  Neurological: He is alert and oriented to person, place, and time. He displays normal reflexes. He exhibits normal muscle tone. Coordination normal.  Skin: Skin is warm and dry. No rash noted. No erythema. No pallor.  Psychiatric: He has a normal mood and affect. His behavior is normal. Judgment and thought content normal.  Nursing note and vitals reviewed.   ED Course  Procedures (including critical care time) Labs Review Labs Reviewed  CBC WITH DIFFERENTIAL/PLATELET - Abnormal; Notable for the following:    WBC 13.5 (*)  MCH 34.1 (*)    Neutrophils Relative % 87 (*)    Neutro Abs 11.7 (*)    Lymphocytes Relative 6 (*)    All other components within normal limits  COMPREHENSIVE METABOLIC PANEL - Abnormal; Notable for the following:    Chloride 93 (*)    Glucose, Bld 203 (*)    Creatinine, Ser 1.37 (*)    AST 79 (*)    GFR calc non Af Amer 60 (*)    GFR calc Af Amer 69 (*)    Anion gap 18 (*)    All other components within normal limits  LIPASE, BLOOD - Abnormal; Notable for  the following:    Lipase 364 (*)    All other components within normal limits  URINALYSIS, ROUTINE W REFLEX MICROSCOPIC - Abnormal; Notable for the following:    Color, Urine AMBER (*)    Ketones, ur 15 (*)    Protein, ur 30 (*)    All other components within normal limits  URINE MICROSCOPIC-ADD ON - Abnormal; Notable for the following:    Casts HYALINE CASTS (*)    All other components within normal limits  URINE RAPID DRUG SCREEN (HOSP PERFORMED)  ETHANOL  I-STAT TROPOININ, ED    Imaging Review No results found.   EKG Interpretation   Date/Time:  Friday October 01 2014 05:17:36 EDT Ventricular Rate:  119 PR Interval:  146 QRS Duration: 98 QT Interval:  345 QTC Calculation: 485 R Axis:   49 Text Interpretation:  Sinus tachycardia Ventricular premature complex  Aberrant complex Probable left atrial enlargement RSR' in V1 or V2,  probably normal variant Borderline prolonged QT interval Confirmed by  Yatzary Merriweather  MD, Jenna Ardoin ([id number]) on 10/01/2014 5:55:08 AM      MDM   Final diagnoses:  Pancreatitis, recurrent   49 year old male with abdominal pain, nausea, vomiting since yesterday morning.  Prior history of pancreatitis.  Although he denies any alcohol since July, prior ED notes show alcohol greater than 200 on his last visit.  He reports this was due to NyQuil.  Patient with hypertension and tachycardia.  Plan for IV fluids and pain medicine.  6:51 AM Patient with elevated lipase of 364, less than his prior of 3000 in July.  Expect patient will need admission to the hospital, as he has persistent pain after 2 mg of Dilaudid.  Marisa Severin, MD 10/01/14 [phone]

## 2014-10-01 NOTE — Progress Notes (Signed)
Utilization review completed.  

## 2014-10-01 NOTE — Progress Notes (Signed)
Samuel Huffman [id number]  Code Status: Full  Admission Data: 10/01/2014 10:30 AM  Attending Provider: Meredith PelJoines  PCP:No primary care provider on file.  Consults/ Treatment Team:    Samuel Huffman is a 49 y.o. male patient admitted from ED awake, alert - oriented X 3 - no acute distress noted. VSS - Blood pressure 107/76, pulse 117, temperature 98.7 Huffman (37.1 C), temperature source Oral, resp. rate 18, height 6\' 2"  (1.88 m), weight 86.183 kg (190 lb), SpO2 97 %. no c/o shortness of breath, no c/o chest pain. Cardiac tele # 19, in place, cardiac monitor yields:sinus tachycardia.   IV Fluids: IV in place, occlusive dsg intact without redness, IV cath forearm left, condition patent and no redness and antecubital left, condition patent and no redness  normal saline.  Allergies: No Known Allergies    Past Medical History  Diagnosis Date  . Insomnia   . Pancreatitis     No prescriptions prior to admission    History: obtained from the patient.   Orientation to room, and floor completed with information packet given to patient/family. Admission INP armband ID verified with patient/family, and in place.  SR up x 2, fall assessment complete, with patient and family able to verbalize understanding of risk associated with falls, and verbalized understanding to call nsg before up out of bed. Call light within reach, patient able to voice, and demonstrate understanding. Skin, clean-dry- intact without evidence of bruising, or skin tears.  No evidence of skin break down noted on exam.  ?  Will cont to eval and treat per MD orders.  Al DecantFlores, Samuel Huffman, [place]RN  10/01/2014 09:35

## 2014-10-01 NOTE — H&P (Signed)
Date: 10/01/2014               Patient Name:  Samuel OliveRichard T Huffman MRN: [id number]  DOB: [birth year] Age / Sex: 49 y.o., male   PCP: No primary care provider on file.         Medical Service: Internal Medicine Teaching Service         Attending Physician: Dr. Margarito LinerJerry Joines, MD    First Contact: Dr. Valentino Saxonothman Pager: [phone]  Second Contact: Dr. Delane GingerGill Pager: [phone]       After Hours (After 5p/  First Contact Pager: [phone]  weekends / holidays): Second Contact Pager: [phone]   Chief Complaint: Abdominal pain  History of Present Illness: Mr. Samuel SquibbWall is a 49 year old male with hx of alcohol abuse and prior episodes of pancreatitis--last one 11/2013 complicated [facility] presenting to the ED today with sudden onset of nausea and vomiting yesterday morning when he woke up followed by gradual worsening of epigastric abdominal pain now radiating to the back. The pain is constant, worsening today to the point where he has to the all EMS, and associated with nausea and vomiting. Unable to tolerate PO, threw up even water. Denies any recent alcohol use, says last use was July 2015, however noted to have elevated ethanol level of 261 2 months ago.  He denies eating fatty foods and says he does not have any pain recently after eating. He denies any change in his routine, only using herbal medication (Rest Z) and chamomile but has not taken it for 1 week, and occasionally takes melatonin. He does not smoke but chews tobacco and denies illicit drug use.   Of note, he does report being stressed out with work lately. 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 He reports having an EEG done recently as requested by work but does not know results.   Meds: Current Facility-Administered Medications  Medication Dose Route Frequency Provider Last Rate Last Dose  .  0.9 %  sodium chloride infusion   Intravenous Once Marisa Severinlga Otter, MD       No current outpatient prescriptions on file.   Allergies: Allergies as of 10/01/2014  . (No Known Allergies)   Past Medical History  Diagnosis Date  . Insomnia   . Pancreatitis    Past Surgical History  Procedure Laterality Date  . Femur surgery     Family History  Problem Relation Age of Onset  . Healthy Son     x1   History   Social History  . Marital Status: Divorced    Spouse Name: Samuel Huffman  . Number of Children: Samuel Huffman  . Years of Education: Samuel Huffman   Occupational History  . Not on file.   Social History Main Topics  . Smoking status: Former Smoker -- 0.50 packs/day    Types: Cigarettes  . Smokeless tobacco: Current User    Types: Snuff  . Alcohol Use: 0.0 oz/week    0 Standard drinks or equivalent per week     Comment: drinks liquior everyday.   . Drug Use: No  . Sexual Activity: Not on file   Other Topics Concern  . Not on file   Social History Narrative   Review of Systems:  Constitutional:  Denies fever, chills. +decreased appetite  HEENT:  Dry mouth  Respiratory:  Denies SOB  Cardiovascular:  Denies chest pain  Gastrointestinal:  +abdominal pain, nausea, vomiting.   Genitourinary:  Denies dysuria  Musculoskeletal:  Denies gait problem.   Neurological:  Denies headaches and says no longer has dizziness.    Physical Exam: Blood pressure 156/107, pulse 114, temperature 98.3 F (36.8 C), temperature source Oral, resp. rate 15, height  (1.88 m), weight 190 lb (86.183 kg), SpO2 95 %. Vitals reviewed. General: resting in bed, NAD HEENT: PERRL, EOMI Cardiac: Tachycardia Pulm: clear to auscultation bilaterally, no wheezes Abd: soft, tenderness to light palpation of epigastric region, nondistended, BS present--hypoactive Ext: warm and well perfused, moving all extremities, +tattoos, +2dp b/l, tremors noted in upper extremities when sitting up and when holding hands straight Neuro:  alert and oriented X3, strength and sensation to light touch equal in bilateral upper and lower extremities  Lab results: Basic Metabolic Panel:  Recent Labs  24/40/10 0433  NA 136  K 3.8  CL 93*  CO2 25  GLUCOSE 203*  BUN 23  CREATININE 1.37*  CALCIUM 9.3   Liver Function Tests:  Recent Labs  10/01/14 0433  AST 79*  ALT <5  ALKPHOS 73  BILITOT 1.0  PROT 7.8  ALBUMIN 4.2    Recent Labs  10/01/14 0433  LIPASE 364*   CBC:  Recent Labs  10/01/14 0433  WBC 13.5*  NEUTROABS 11.7*  HGB 14.8  HCT 43.2  MCV 99.5  PLT 288   Urine Drug Screen: Drugs of Abuse     Component Value Date/Time   LABOPIA NONE DETECTED 10/01/2014 0539   COCAINSCRNUR NONE DETECTED 10/01/2014 0539   LABBENZ NONE DETECTED 10/01/2014 0539   AMPHETMU NONE DETECTED 10/01/2014 0539   THCU NONE DETECTED 10/01/2014 0539   LABBARB NONE DETECTED 10/01/2014 0539    Alcohol Level:  Recent Labs  10/01/14 0430  ETH <5   Urinalysis:  Recent Labs  10/01/14 0539  COLORURINE AMBER*  LABSPEC 1.028  PHURINE 5.5  GLUCOSEU NEGATIVE  HGBUR NEGATIVE  BILIRUBINUR NEGATIVE  KETONESUR 15*  PROTEINUR 30*  UROBILINOGEN 0.2  NITRITE NEGATIVE  LEUKOCYTESUR NEGATIVE   Other results: EKG: 119bpm, sinus tachycardia, qtc 485, ?TWI lead III and V2  Assessment & Plan by Problem: Principal Problem:   Acute recurrent pancreatitis Active Problems:   Elevated BP   AKI (acute kidney injury)   History of alcohol abuse   Lactic acidosis   High anion gap metabolic acidosis  Severe abdominal pain likely 2/2 acute recurrent pancreatitis--x2 days, sudden onset.  ?chronic pancreatitis.  Unclear inciting cause for current episode. Initial pancreatitis episodes thought to be secondary to alcohol abuse. Patient denies recent alcohol use and reports cessation for 1 year, although noted to have elevated ethanol level 2 months ago.  Lipase level currently 364, less than >3000 in June 2015.  AST >ALT: 79:<5, TB  1.0, although AST seems to be trending down since June 2015.  On review of care everywhere with [facility], he appears to have had a complicated course of pancreatitis with possible necrosis and pseudocyst in 11/2013.  He was supposed to follow up with surgery, but that seems to have not occurred based on record review and was also started on Creon which he is not taking.  Last HbA1C 5.6, Chol 232, TG 140, HDL 88, LDL 116. TSH 1.13 per outside records 07/2014.  Although based on presentation he does appear to have recurrent pancreatitis with characteristic abdominal pain and elevated lipase, given his history of ?necrosis  and pseudocyst and this sudden recurrent event, he may be a candidate for additional imaging for further evaluation.  However, currently with his elevated Cr, caution for contrast study. Currently he is afebrile but has a mild leukocytosis of 13.5.  If condition worsens or he becomes febrile, would consider antibiotics.  Other etiologies of abdominal pain include but not limited to gastritis or PUD (intermittent NSAID use occasionally) vs. Cholelithiasis/choledocolithiasis vs. Mesenteric ischemia vs. SBO. -admit to tele -IVF hydration with NS -check Amylase, HIV, Lactic acid -pain control, morphine  q4h prn for now -zofran prn nausea -NPO -U/S negative for cholelithiasis but suboptimal visualized pancreas secondary to overlying bowel gas, small ascites, hepatic steatosis -AM CMET and CBC -monitor urine output and electrolytes  AG metabolic acidosis--LA up to 4.7 in setting of likely recurrent pancreatitis. Patient continues to have abdominal pain despite pain management that seems out of proportion to exam. Will proceed with CT angio for concern of ?mesenteric ischemia vs. complications of pancreatitis. -continue IVF -trend LA -CT abdomen  AKI--likely in setting of decreased po intake and GI losses. Cr 1.37 on admission, up from 1.25 Jun 2014.   -IVF hydration -AM BMET -check  CK (1094 in 06/2014)  Hx of alcohol abuse--denies any use sine June 2015, however noted to have elevated ethanol level in January 2016. Patient does have some tremors on physical exam today along with tachycardia, but could be in setting of pain as well. UDS negative. Current ethanol level <5.  -CIWA scoring -Seizure precautions given hx of ?seizures and recent EEG that was done  Elevated BP--likely in setting of acute pain, however, is noted to have HTN listed in PMH of outside records. He is not currently on any pain medication. -monitor BP -follow up with pcp, may need antihypertensive management if persistently elevated  Diet: NPO DVT Ppx: Lovenox Dispo: Disposition is deferred at this time, awaiting improvement of current medical problems. Anticipated discharge in approximately 2-3 day(s).   The patient does not have a current PCP but has been recently seen at [facility] in [place] and may wish to continue to follow up with them upon discharge--will have primary team discuss with patient.   The patient does not have transportation limitations that hinder transportation to clinic appointments.  Signed: Baltazar Apo, MD 10/01/2014, 8:09 AM

## 2014-10-01 NOTE — Progress Notes (Signed)
CRITICAL VALUE ALERT  Critical value received:  Lactic Acid 4.7  Date of notification:  10/01/14  Time of notification:  10:29am  Critical value read back:Yes.    Nurse who received alert:  Beckey Downingindy Flores   MD notified (1st page):  Valentino Saxonothman MD   Time of first page:  10:29am  MD notified (2nd page):  Time of second page:  Responding MD:  Valentino Saxonothman MD   Time MD responded:  10:29am

## 2014-10-01 NOTE — Progress Notes (Signed)
Report called to ED  

## 2014-10-02 DIAGNOSIS — E876 Hypokalemia: Secondary | ICD-10-CM

## 2014-10-02 DIAGNOSIS — F1021 Alcohol dependence, in remission: Secondary | ICD-10-CM

## 2014-10-02 LAB — COMPREHENSIVE METABOLIC PANEL
ALT: 26 U/L (ref 0–53)
ANION GAP: 6 (ref 5–15)
AST: 36 U/L (ref 0–37)
Albumin: 2.9 g/dL — ABNORMAL LOW (ref 3.5–5.2)
Alkaline Phosphatase: 54 U/L (ref 39–117)
BILIRUBIN TOTAL: 1.4 mg/dL — AB (ref 0.3–1.2)
BUN: 13 mg/dL (ref 6–23)
CALCIUM: 7.6 mg/dL — AB (ref 8.4–10.5)
CO2: 29 mmol/L (ref 19–32)
Chloride: 98 mmol/L (ref 96–112)
Creatinine, Ser: 0.7 mg/dL (ref 0.50–1.35)
GFR calc non Af Amer: >90 mL/min (ref >90)
Glucose, Bld: 111 mg/dL — ABNORMAL HIGH (ref 70–99)
POTASSIUM: 3 mmol/L — AB (ref 3.5–5.1)
SODIUM: 133 mmol/L — AB (ref 135–145)
Total Protein: 5.5 g/dL — ABNORMAL LOW (ref 6.0–8.3)

## 2014-10-02 LAB — PHOSPHORUS: PHOSPHORUS: 1.8 mg/dL — AB (ref 2.3–4.6)

## 2014-10-02 LAB — CBC
HCT: 30.9 % — ABNORMAL LOW (ref 39.0–52.0)
Hemoglobin: 10.3 g/dL — ABNORMAL LOW (ref 13.0–17.0)
MCH: 34.3 pg — AB (ref 26.0–34.0)
MCHC: 33.3 g/dL (ref 30.0–36.0)
MCV: 103 fL — ABNORMAL HIGH (ref 78.0–100.0)
Platelets: 142 10*3/uL — ABNORMAL LOW (ref 150–400)
RBC: 3 MIL/uL — ABNORMAL LOW (ref 4.22–5.81)
RDW: 12.8 % (ref 11.5–15.5)
WBC: 9.3 10*3/uL (ref 4.0–10.5)

## 2014-10-02 LAB — MAGNESIUM: Magnesium: 1.2 mg/dL — ABNORMAL LOW (ref 1.5–2.5)

## 2014-10-02 LAB — GLUCOSE, CAPILLARY: Glucose-Capillary: 126 mg/dL — ABNORMAL HIGH (ref 70–99)

## 2014-10-02 MED ORDER — VITAMIN B-1 100 MG PO TABS
100.0000 mg | ORAL_TABLET | Freq: Every day | ORAL | Status: DC
  Administered 2014-10-03: 100 mg via ORAL
  Filled 2014-10-02: qty 1

## 2014-10-02 MED ORDER — POTASSIUM CHLORIDE CRYS ER 20 MEQ PO TBCR
40.0000 meq | EXTENDED_RELEASE_TABLET | Freq: Once | ORAL | Status: AC
  Administered 2014-10-02: 40 meq via ORAL
  Filled 2014-10-02: qty 2

## 2014-10-02 MED ORDER — HYDROMORPHONE HCL 1 MG/ML IJ SOLN: 1.0000 mg | Freq: Four times a day (QID) | INTRAMUSCULAR | Status: DC | PRN

## 2014-10-02 MED ORDER — OXYCODONE-ACETAMINOPHEN 5-325 MG PO TABS
1.0000 | ORAL_TABLET | Freq: Four times a day (QID) | ORAL | Status: DC | PRN
  Administered 2014-10-02 – 2014-10-03 (×3): 1 via ORAL
  Filled 2014-10-02 (×3): qty 1

## 2014-10-02 MED ORDER — FOLIC ACID 1 MG PO TABS
1.0000 mg | ORAL_TABLET | Freq: Every day | ORAL | Status: DC
  Administered 2014-10-03: 1 mg via ORAL
  Filled 2014-10-02: qty 1

## 2014-10-02 MED ORDER — POTASSIUM CHLORIDE CRYS ER 20 MEQ PO TBCR: 40.0000 meq | EXTENDED_RELEASE_TABLET | Freq: Once | ORAL | Status: DC

## 2014-10-02 MED ORDER — MAGNESIUM CHLORIDE 64 MG PO TBEC
1.0000 | DELAYED_RELEASE_TABLET | Freq: Once | ORAL | Status: AC
  Administered 2014-10-02: 64 mg via ORAL
  Filled 2014-10-02: qty 1

## 2014-10-02 MED ORDER — SODIUM CHLORIDE 0.9 % IV SOLN
INTRAVENOUS | Status: DC
  Administered 2014-10-02: 10:00:00 via INTRAVENOUS

## 2014-10-02 MED ORDER — K PHOS MONO-SOD PHOS DI & MONO 155-852-130 MG PO TABS
500.0000 mg | ORAL_TABLET | Freq: Once | ORAL | Status: AC
Start: 2014-10-02 — End: 2014-10-02
  Administered 2014-10-02: 500 mg via ORAL
  Filled 2014-10-02: qty 2

## 2014-10-02 MED ORDER — POTASSIUM PHOSPHATE MONOBASIC 500 MG PO TABS
500.0000 mg | ORAL_TABLET | Freq: Once | ORAL | Status: DC
  Filled 2014-10-02: qty 1

## 2014-10-02 NOTE — Progress Notes (Signed)
Subjective: Samuel Huffman is feeling much better today. He says that he has no more nausea, no emesis overnight. He is not hungry but would like to try to start a diet as he wants to be discharged soon. We also explained this will require weaning his analgesics and he was agreeable.  Objective: Vital signs in last 24 hours: Filed Vitals:   10/01/14 0950 10/01/14 1338 10/01/14 2359 10/02/14 0643  BP:  162/101 135/88 134/91  Pulse: 117 97 89 124  Temp:  98.3 F (36.8 C) 99.6 F (37.6 C) 98.9 F (37.2 C)  TempSrc:  Oral Oral Oral  Resp:  Height:      Weight:      SpO2:  100% 99% 97%   Weight change:   Intake/Output Summary (Last 24 hours) at 10/02/14 1029 Last data filed at 10/02/14 0204  Gross per 24 hour  Intake 3266.67 ml  Output      0 ml  Net 3266.67 ml   Gen: A&O x 4, No acute distress, well developed, well nourished HEENT: Atraumatic, PERRL, EOMI, sclerae anicteric, moist mucous membranes Heart: Regular rate and rhythm, normal S1 S2, no murmurs, rubs, or gallops Lungs: Clear to auscultation bilaterally, respirations unlabored Abd: Soft, tenderness in epigastric region, some voluntary muscle guarding, non-distended, + bowel sounds, no hepatosplenomegaly Ext: No edema or cyanosis   Lab Results: Basic Metabolic Panel:  Recent Labs Lab 10/01/14 1532 10/02/14 0415  NA 138 133*  K 3.5 3.0*  CL 99 98  CO2 33* 29  GLUCOSE 162* 111*  BUN 19 13  CREATININE 0.92 0.70  CALCIUM 7.9* 7.6*  MG  --  1.2*  PHOS  --  1.8*   Liver Function Tests:  Recent Labs Lab 10/01/14 0433 10/02/14 0415  AST 79* 36  ALT <5 26  ALKPHOS 73 54  BILITOT 1.0 1.4*  PROT 7.8 5.5*  ALBUMIN 4.2 2.9*    Recent Labs Lab 10/01/14 0433 10/01/14 0933  LIPASE 364*  --   AMYLASE  --  267*   CBC:  Recent Labs Lab 10/01/14 0433 10/02/14 0415  WBC 13.5* 9.3  NEUTROABS 11.7*  --   HGB 14.8 10.3*  HCT 43.2 30.9*  MCV 99.5 103.0*  PLT 288 142*   Cardiac  Enzymes:  Recent Labs Lab 10/01/14 0933  CKTOTAL 68   CBG:  Recent Labs Lab 10/02/14 0748  GLUCAP 126*   Urine Drug Screen: Drugs of Abuse     Component Value Date/Time   LABOPIA NONE DETECTED 10/01/2014 0539   COCAINSCRNUR NONE DETECTED 10/01/2014 0539   LABBENZ NONE DETECTED 10/01/2014 0539   AMPHETMU NONE DETECTED 10/01/2014 0539   THCU NONE DETECTED 10/01/2014 0539   LABBARB NONE DETECTED 10/01/2014 0539    Alcohol Level:  Recent Labs Lab 10/01/14 0430  ETH <5   Urinalysis:  Recent Labs Lab 10/01/14 0539  COLORURINE AMBER*  LABSPEC 1.028  PHURINE 5.5  GLUCOSEU NEGATIVE  HGBUR NEGATIVE  BILIRUBINUR NEGATIVE  KETONESUR 15*  PROTEINUR 30*  UROBILINOGEN 0.2  NITRITE NEGATIVE  LEUKOCYTESUR NEGATIVE   Misc. Labs:  Lactic acid 4.7>2.0 HIV negative  Micro Results: No results found for this or any previous visit (from the past 240 hour(s)). Studies/Results: US Abdomen Complete  10/01/2014   CLINICAL DATA:  Acute recurrent pancreatitis  EXAM: ULTRASOUND ABDOMEN COMPLETE  COMPARISON:  None.  FINDINGS: Gallbladder: No gallstones or Benninger thickening visualized. No sonographic Murphy sign noted.  Common bile duct: Diameter: 3.9 mm  Liver: No focal lesion identified. Increased hepatic parenchymal echogenicity.  IVC: No abnormality visualized.  Pancreas: Suboptimally visualized secondary to overlying bowel.  Spleen: Size and appearance within normal limits.  Right Kidney: Length: 10.6 cm. Echogenicity within normal limits. No mass or hydronephrosis visualized.  Left Kidney: Length: 11 cm. Echogenicity within normal limits. No mass or hydronephrosis visualized.  Abdominal aorta: No aneurysm visualized.  Other findings: Small amount of abdominal ascites.  IMPRESSION: 1. No cholelithiasis. 2. Suboptimally visualized pancreas secondary to overlying bowel gas. 3. Small amount of abdominal ascites. 4. Hepatic steatosis.   Electronically Signed   By: Elige Ko   On: 10/01/2014  11:19   Ct Angio Abd/pel W/ And/or W/o  10/01/2014   CLINICAL DATA:  49 year old male with abdominal and pelvic pain for 1 week. History of prior pancreatitis. Evaluate mesenteric arteries.  EXAM: CTA ABDOMEN AND PELVIS WITHOUT AND WITH CONTRAST  TECHNIQUE: Multidetector CT imaging of the abdomen and pelvis was performed using the standard protocol during bolus administration of intravenous contrast. Multiplanar reconstructed images and MIPs were obtained and reviewed to evaluate the vascular anatomy.  CONTRAST:  80 cc intravenous Omnipaque 350  COMPARISON:  12/08/2013 CT  FINDINGS: This is a technically satisfactory study for evaluation of the mesenteric arterial system.  Lower chest: Mild circumferential distal esophageal [place] thickening is noted.  Hepatobiliary: The liver and gallbladder are unremarkable. There is no evidence of biliary dilatation.  Pancreas: Mild -moderate peripancreatic inflammation is compatible with pancreatitis. A 4 cm pancreatic tail pseudo cysts and 2.5 cm pseudocyst along the pancreatic head are identified. Smaller or areas of hypodensity within the pancreatic head are probably related pancreatitis/ fluid. Pole There is no evidence of pancreatic necrosis or venous thrombosis. A small amount of acute fluid in the retroperitoneum and very gastric regions noted.  Spleen: Unremarkable  Adrenals/Urinary Tract: The kidneys, adrenal glands and bladder are unremarkable.  Stomach/Bowel: Perigastric fluid/inflammation is noted. There is no evidence of bowel obstruction. There is no evidence of other bowel Busser thickening or pneumatosis. The appendix is normal.  Vascular/Lymphatic: The celiac artery, SMA and IMA and visualized branches are patent. There is no evidence of mesenteric aneurysm or stenosis. Shotty peripancreatic lymph nodes are likely reactive.  Reproductive: The prostate is unremarkable.  Other: A small amount of ascites within the abdomen pelvis are noted.  Musculoskeletal: No acute  or suspicious abnormalities are identified. A small left inguinal hernia containing fat is is unchanged. Irregularity of the proximal left femur is again identified.  Review of the MIP images confirms the above findings.  IMPRESSION: Mild-moderate pancreatitis without evidence of pancreatic necrosis or venous thrombosis. Status cysts along pancreatic head and pancreatic tail as described. Inflammatory changes are noted within the retroperitoneum and perigastric regions. Small amount of ascites within the abdomen and pelvis.  No evidence of mesenteric ischemia or mesenteric vasculature stenosis/aneurysm.  Diffuse distal esophageal [place] thickening likely representing esophagitis.  Unchanged small left inguinal hernia containing fat.   Electronically Signed   By: Harmon Pier M.D.   On: 10/01/2014 17:45   Medications: I have reviewed the patient's current medications. Scheduled Meds: . enoxaparin (LOVENOX) injection  40 mg Subcutaneous Q24H  . potassium chloride  40 mEq Oral Once  . sodium chloride  3 mL Intravenous Q12H  . thiamine  100 mg Intravenous Daily   Continuous Infusions: . sodium chloride 100 mL/hr at 10/02/14 0937   PRN Meds:.HYDROmorphone (DILAUDID) injection, ondansetron **OR** ondansetron (ZOFRAN) IV Assessment/Plan: Principal Problem:   Acute  recurrent pancreatitis Active Problems:   Elevated BP   AKI (acute kidney injury)   History of alcohol abuse   Lactic acidosis   High anion gap metabolic acidosis  Acute recurrent pancreatitis: Samuel [name] presented with abdominal pain radiating to his back with associated nausea and emesis and had confirmed mild-moderate pancreatitis without evidence of pancreatic necrosis of venous thrombosis. CTA did note pseudocysts along pancreatic head and tail. Negative for mesenteric ischemia or vascular stenosis/aneurysm. He feels much improved today and said he had no nausea or emesis. Lactic acidosis has resolved and anion gap closed with aggressive  hydration. He would like to progress his diet and wean analgesics to progress towards discharge. The cause is unclear as he has no gallstones on CTA and denies any recent alcohol use -decrease dilaudid from 1 mg iv q3hprn to dilaudid 1 mg iv q6hprn -decrease NS from 200 cc/hr to 100 cc/hr -advance diet to clear liquid -cont zofran 4 mg q4hprn  Hypokalemia: K 3.0 this morning. Likely 2/2 emesis -KDur 40 mEq po once -cont to monitor  AKI: Presenting creatinine 1.37 is now 0.70 after aggressive hydration. This was likely due to GI losses and poor po intake in setting of acute pancreatitis. -NS @ 100 cc/hr as above  History of alcohol abuse: Samuel [name] says he has not drank in about a year and his positive ethanol in January was due to taking nyquil. He was counseled on the dangers of drinking any alcohol for someone with recurrent pancreatitis. -counseling as above -CIWA  Diet: clear liquid  DVT PPx: lovenox 40 mg [place] daily  Code: Full   Dispo: Disposition is deferred at this time, awaiting improvement of current medical problems.  Anticipated discharge in approximately 2 day(s).   The patient does not have a current PCP (No primary care provider on file.) and [facility].  The patient does not have transportation limitations that hinder transportation to clinic appointments.  .Services Needed at time of discharge: Y = Yes, Blank = No PT:   OT:   RN:   Equipment:   Other:     LOS: 1 day   Lorenda HatchetAdam L Clois Montavon, MD 10/02/2014, 10:29 AM

## 2014-10-03 LAB — BASIC METABOLIC PANEL
Anion gap: 7 (ref 5–15)
BUN: 7 mg/dL (ref 6–23)
CHLORIDE: 96 mmol/L (ref 96–112)
CO2: 26 mmol/L (ref 19–32)
Calcium: 7.7 mg/dL — ABNORMAL LOW (ref 8.4–10.5)
Creatinine, Ser: 0.71 mg/dL (ref 0.50–1.35)
GFR calc non Af Amer: >90 mL/min (ref >90)
GLUCOSE: 183 mg/dL — AB (ref 70–99)
POTASSIUM: 2.9 mmol/L — AB (ref 3.5–5.1)
Sodium: 129 mmol/L — ABNORMAL LOW (ref 135–145)

## 2014-10-03 LAB — PHOSPHORUS: PHOSPHORUS: 1.9 mg/dL — AB (ref 2.3–4.6)

## 2014-10-03 LAB — MAGNESIUM: MAGNESIUM: 1.3 mg/dL — AB (ref 1.5–2.5)

## 2014-10-03 LAB — GLUCOSE, CAPILLARY
GLUCOSE-CAPILLARY: 87 mg/dL (ref 70–99)
GLUCOSE-CAPILLARY: 119 mg/dL — AB (ref 70–99)

## 2014-10-03 MED ORDER — FOLIC ACID 1 MG PO TABS: 1.0000 mg | ORAL_TABLET | Freq: Every day | ORAL | Status: DC

## 2014-10-03 MED ORDER — K PHOS MONO-SOD PHOS DI & MONO 155-852-130 MG PO TABS
500.0000 mg | ORAL_TABLET | Freq: Once | ORAL | Status: AC
  Administered 2014-10-03: 500 mg via ORAL
  Filled 2014-10-03: qty 2

## 2014-10-03 MED ORDER — SODIUM CHLORIDE 0.9 % IV BOLUS (SEPSIS)
500.0000 mL | Freq: Once | INTRAVENOUS | Status: AC
  Administered 2014-10-03: 500 mL via INTRAVENOUS

## 2014-10-03 MED ORDER — THIAMINE HCL 100 MG PO TABS: 100.0000 mg | ORAL_TABLET | Freq: Every day | ORAL | Status: DC

## 2014-10-03 MED ORDER — MAGNESIUM CHLORIDE 64 MG PO TBEC
2.0000 | DELAYED_RELEASE_TABLET | Freq: Once | ORAL | Status: AC
  Administered 2014-10-03: 128 mg via ORAL
  Filled 2014-10-03: qty 2

## 2014-10-03 MED ORDER — POTASSIUM CHLORIDE CRYS ER 20 MEQ PO TBCR
40.0000 meq | EXTENDED_RELEASE_TABLET | Freq: Once | ORAL | Status: AC
  Administered 2014-10-03: 40 meq via ORAL
  Filled 2014-10-03: qty 2

## 2014-10-03 NOTE — Progress Notes (Signed)
Subjective: [name] is feeling much better today and denies abdominal pain-->no N/V.  He is requesting a regular diet.  Has was able to tolerate full liquids yesterday.  Only received 1 dose of percocet at 4AM.     Objective: Vital signs in last 24 hours: Filed Vitals:   10/02/14 1204 10/02/14 1933 10/02/14 2357 10/03/14 0618  BP: 141/82 138/76 124/74 138/74  Pulse: 97 88  73  Temp: 99.6 F (37.6 C) 99.2 F (37.3 C) 99.6 F (37.6 C) 99 F (37.2 C)  TempSrc: Oral Oral Oral Oral  Resp: 20 18 14    Height:      Weight:      SpO2: 99% 98% 97% 97%   Weight change:   Intake/Output Summary (Last 24 hours) at 10/03/14 1149 Last data filed at 10/03/14 1033  Gross per 24 hour  Intake    836 ml  Output      0 ml  Net    836 ml   Gen: A&O x 4, No acute distress, well developed, well nourished HEENT: Atraumatic, EOMI, sclerae anicteric, moist mucous membranes Heart: RRR, no murmurs, rubs, or gallops Lungs: Clear to auscultation bilaterally, respirations unlabored Abd: Soft, non-distended, + bowel sounds Ext: No edema or cyanosis   Lab Results: Basic Metabolic Panel:  Recent Labs Lab 10/02/14 0415 10/03/14 0918  NA 133* 129*  K 3.0* 2.9*  CL 98 96  CO2 29 26  GLUCOSE 111* 183*  BUN 13 7  CREATININE 0.70 0.71  CALCIUM 7.6* 7.7*  MG 1.2* 1.3*  PHOS 1.8* 1.9*   Liver Function Tests:  Recent Labs Lab 10/01/14 0433 10/02/14 0415  AST 79* 36  ALT <5 26  ALKPHOS 73 54  BILITOT 1.0 1.4*  PROT 7.8 5.5*  ALBUMIN 4.2 2.9*    Recent Labs Lab 10/01/14 0433 10/01/14 0933  LIPASE 364*  --   AMYLASE  --  267*   CBC:  Recent Labs Lab 10/01/14 0433 10/02/14 0415  WBC 13.5* 9.3  NEUTROABS 11.7*  --   HGB 14.8 10.3*  HCT 43.2 30.9*  MCV 99.5 103.0*  PLT 288 142*   Cardiac Enzymes:  Recent Labs Lab 10/01/14 0933  CKTOTAL 68   CBG:  Recent Labs Lab 10/02/14 0748 10/03/14 0748  GLUCAP 126* 87   Urine Drug Screen: Drugs of Abuse     Component  Value Date/Time   LABOPIA NONE DETECTED 10/01/2014 0539   COCAINSCRNUR NONE DETECTED 10/01/2014 0539   LABBENZ NONE DETECTED 10/01/2014 0539   AMPHETMU NONE DETECTED 10/01/2014 0539   THCU NONE DETECTED 10/01/2014 0539   LABBARB NONE DETECTED 10/01/2014 0539    Alcohol Level:  Recent Labs Lab 10/01/14 0430  ETH <5   Urinalysis:  Recent Labs Lab 10/01/14 0539  COLORURINE AMBER*  LABSPEC 1.028  PHURINE 5.5  GLUCOSEU NEGATIVE  HGBUR NEGATIVE  BILIRUBINUR NEGATIVE  KETONESUR 15*  PROTEINUR 30*  UROBILINOGEN 0.2  NITRITE NEGATIVE  LEUKOCYTESUR NEGATIVE   Misc. Labs:  Lactic acid 4.7>2.0 HIV negative  Micro Results: No results found for this or any previous visit (from the past 240 hour(s)). Studies/Results: Ct Angio Abd/pel W/ And/or W/o  10/01/2014   CLINICAL DATA:  49 year old male with abdominal and pelvic pain for 1 week. History of prior pancreatitis. Evaluate mesenteric arteries.  EXAM: CTA ABDOMEN AND PELVIS WITHOUT AND WITH CONTRAST  TECHNIQUE: Multidetector CT imaging of the abdomen and pelvis was performed using the standard protocol during bolus administration of intravenous contrast. Multiplanar  reconstructed images and MIPs were obtained and reviewed to evaluate the vascular anatomy.  CONTRAST:  80 cc intravenous Omnipaque 350  COMPARISON:  12/08/2013 CT  FINDINGS: This is a technically satisfactory study for evaluation of the mesenteric arterial system.  Lower chest: Mild circumferential distal esophageal [place] thickening is noted.  Hepatobiliary: The liver and gallbladder are unremarkable. There is no evidence of biliary dilatation.  Pancreas: Mild -moderate peripancreatic inflammation is compatible with pancreatitis. A 4 cm pancreatic tail pseudo cysts and 2.5 cm pseudocyst along the pancreatic head are identified. Smaller or areas of hypodensity within the pancreatic head are probably related pancreatitis/ fluid. Pole There is no evidence of pancreatic necrosis or  venous thrombosis. A small amount of acute fluid in the retroperitoneum and very gastric regions noted.  Spleen: Unremarkable  Adrenals/Urinary Tract: The kidneys, adrenal glands and bladder are unremarkable.  Stomach/Bowel: Perigastric fluid/inflammation is noted. There is no evidence of bowel obstruction. There is no evidence of other bowel [place] thickening or pneumatosis. The appendix is normal.  Vascular/Lymphatic: The celiac artery, SMA and IMA and visualized branches are patent. There is no evidence of mesenteric aneurysm or stenosis. Shotty peripancreatic lymph nodes are likely reactive.  Reproductive: The prostate is unremarkable.  Other: A small amount of ascites within the abdomen pelvis are noted.  Musculoskeletal: No acute or suspicious abnormalities are identified. A small left inguinal hernia containing fat is is unchanged. Irregularity of the proximal left femur is again identified.  Review of the MIP images confirms the above findings.  IMPRESSION: Mild-moderate pancreatitis without evidence of pancreatic necrosis or venous thrombosis. Status cysts along pancreatic head and pancreatic tail as described. Inflammatory changes are noted within the retroperitoneum and perigastric regions. Small amount of ascites within the abdomen and pelvis.  No evidence of mesenteric ischemia or mesenteric vasculature stenosis/aneurysm.  Diffuse distal esophageal [place] thickening likely representing esophagitis.  Unchanged small left inguinal hernia containing fat.   Electronically Signed   By: Harmon Pier M.D.   On: 10/01/2014 17:45   Medications: I have reviewed the patient's current medications. Scheduled Meds: . folic acid  1 mg Oral Daily  . magnesium chloride  2 tablet Oral Once  . phosphorus  500 mg Oral Once  . potassium chloride SA  40 mEq Oral Once  . sodium chloride  3 mL Intravenous Q12H  . thiamine  100 mg Oral Daily   Continuous Infusions:   PRN Meds:.ondansetron **OR** ondansetron (ZOFRAN) IV,  oxyCODONE-acetaminophen Assessment/Plan: Principal Problem:   Acute recurrent pancreatitis Active Problems:   Elevated BP   AKI (acute kidney injury)   History of alcohol abuse   Lactic acidosis   High anion gap metabolic acidosis  Acute recurrent pancreatitis:  Resolution of pain without N/V.  Requesting a regular diet after tolerating full liquids yesterday.   -advance diet to regular  -likely d/c today   Hypokalemic, hypomagnesemia, hypophosphatemia: Likely d/t alcohol abuse.   -replete today -recheck BMP later today  Hyperglycemia: -check HA1c   History of alcohol abuse: CIWA scores of 0.    Diet: regular   DVT PPx: lovenox 40 mg [place] daily  Code: Full  Dispo: Discharge likely today pending correction of potassium.  Will likely need potassium supplementation upon d/c.      LOS: 2 days   Marrian Salvage, MD 10/03/2014, 11:49 AM

## 2014-10-03 NOTE — Discharge Instructions (Signed)
Please schedule a follow-up appointment with a primary doctor.  You may call this number [phone] to find a primary doctor and schedule an appointment.  You need to have your potassium, magnesium, and phosphorus levels checked next week.     Please continue to take all of your medications as prescribed.  Do not miss any doses without contacting your primary physician.  If you have questions, please contact your physician or contact the Internal Medicine Teaching Service at [phone].  Please bring your medicications with you to your appointments; medications may be eye drops, herbals, vitamins, or pills.    If you believe you are suffering from a life-threatening emergency, go to the nearest Emergency Department.      [employer] Resource Guide  1) Find a Scientist, water qualityDoctor and Pay Out of Pocket Although you won't have to find out who is covered by [employer] Corporationyour insurance plan, it is a good idea to ask around and get recommendations. You will then need to call the office and see if the doctor you have chosen will accept you as a new patient and what types of options they offer for patients who are self-pay. Some doctors offer discounts or will set up payment plans for their patients who do not have insurance, but you will need to ask so you aren't surprised when you get to your appointment.  2) Contact Your Local Health Department Not all health departments have doctors that can see patients for sick visits, but many do, so it is worth a call to see if yours does. If you don't know where your local health department is, you can check in your phone book. The CDC also has a tool to help you locate your state's health department, and many state websites also have listings of all of their local health departments.  3) Find a Walk-in Clinic If your illness is not likely to be very severe or complicated, you may want to try a walk in clinic. These are popping up all over the country in pharmacies, drugstores, and  shopping centers. They're usually staffed by nurse practitioners or physician assistants that have been trained to treat common illnesses and complaints. They're usually fairly quick and inexpensive. However, if you have serious medical issues or chronic medical problems, these are probably not your best option.  No Primary Care Doctor: - Call Health Connect at  [phone] - they can help you locate a primary care doctor that  accepts your insurance, provides certain services, etc. - Physician Referral Service- [phone]  Chronic Pain Problems: Organization         Address  Phone   Notes  [facility]  [phone] Patients need to be referred by their primary care doctor.   Medication Assistance: Organization         Address  Phone   Notes  [facility] Medication [facility] [address] [place], [place] [id number] [phone] --Must be a resident of [place] -- Must have NO insurance coverage whatsoever (no Medicaid/ Medicare, etc.) -- The pt. MUST have a primary care doctor that directs their care regularly and follows them in the community   MedAssist  [phone]) [phone]   [employer]  [phone]    Agencies that provide inexpensive medical care: Organization         Address  Phone   Notes  [facility]  [phone]) [phone]   [facility]    [phone]) [phone]  [facility] [place], [place] [address] [phone]   [place]. [address], [place] [phone]   Planned Parenthood    [phone]   [facility]    [phone]   [place] and [address], [place] Phone:  [phone], Fax:  [phone] Hours of Operation:  9 am - 6 pm, M-F.  Also accepts Medicaid/Medicare and self-pay.  [facility] for [place], [address], [place] Phone: [phone],  Fax: [phone]. Hours of Operation:  8:30 am - 5:30 pm, M-F.  Also accepts Medicaid and self-pay.  [facility] [place] [address] [place] Phone: [phone]   [place], [place], [place] [phone], Ext. [phone] Mondays & Thursdays: 7-9 AM.  First 15 patients are seen on a first come, first serve basis.    [place] Providers:  Organization         Address  Phone   Notes  [facility] [address], [place] [phone] Also accepts self-pay patients.  [facility] [address] [place], [place]  [phone]   [place], [address], [place] [phone]   [facility] [address], [place] [phone]   Lucianne Lei [address], [place]   [phone] Only accepts [place] Access [place] patients after they have their name applied to their card.   Self-Pay (no insurance) in [facility]:  Organization         Address  Phone   Notes  Sickle Cell Patients, [facility] [place] [phone]   [facility] [place] [phone]   [facility] [place]  [place], [place], [place] [phone]   [facility]/Dr. Osei-Bonsu  [address] [place] or [address], [place] [phone] Phone number for both [place] and [place] locations is the same.  Urgent Medical and [facility] [address], [place] [phone]   [facility] [address] [place] or [address] [phone]   [facility] [address], [place] [phone], phone; [phone], fax Sees patients 1st and 3rd Saturday of every month.  Must not qualify for public or private  insurance (i.e. Medicaid, Medicare, [place] Health Choice, Veterans' Benefits)  Household income should be no more than 200% of the poverty level The clinic cannot treat you if you are pregnant or think you are pregnant  Sexually transmitted diseases are not treated at the clinic.    Dental Care: Organization         Address  Phone  Notes  [facility] Department of [facility] [place] [phone] Accepts children up to age 32 who are enrolled in [place] or [place]; pregnant women with a Medicaid card; and children who have applied for Medicaid or [place] Health Choice, but were declined, whose parents can pay a reduced fee at time of service.  [facility] Department of [facility]  [address], [place] [phone] Accepts children up to age 74 who  are enrolled in Medicaid or [place] Health Choice; pregnant women with a Medicaid card; and children who have applied for Medicaid or [place] Health Choice, but were declined, whose parents can pay a reduced fee at time of service.  [place] Adult Dental Access PROGRAM  [place] [phone] Patients are seen by appointment only. Walk-ins are not accepted. [place] will see patients 9 years of age and older. Monday - Tuesday (8am-5pm) Most Wednesdays (8:30-5pm) $30 per visit, cash only  [facility] Adult Dental Access PROGRAM  [address], [facility] [phone] Patients are seen by appointment only. Walk-ins are not accepted. [place] will see patients 51 years of age and older. One Wednesday Evening (Monthly: Volunteer Based).  $30 per visit, cash only  [place]  [phone] for adults; Children under age 10, call Graduate Pediatric Dentistry at [phone]. Children aged 48-14, please call [phone] to request a pediatric application.  Dental services are provided in all areas of dental care  including fillings, crowns and bridges, complete and partial dentures, implants, gum treatment, root canals, and extractions. Preventive care is also provided. Treatment is provided to both adults and children. Patients are selected via a lottery and there is often a waiting list.   [facility] [address] [place]  [phone] [email or web address]   [facility] [address] [place] [place], [place] [phone], Ext. [phone] Second and Fourth Thursday of each month, opens at 6:30 AM; Clinic ends at 9 AM.  Patients are seen on a first-come first-served basis, and a limited number are seen during each clinic.   [facility]  [address] Hillard Danker [place], [place] [phone]   Eligibility Requirements You must have lived in [place], [place], or [place] counties for at least the last three months.   You cannot be eligible for state or federal sponsored [employer], including [employer], [place], or [employer].   You generally cannot be eligible for healthcare insurance through your employer.    How to apply: Eligibility screenings are held every Tuesday and Wednesday afternoon from 1:00 pm until 4:00 pm. You do not need an appointment for the interview!  [facility] [address], [place], [place]   [place]  [place] Department  [place]  [phone]    Behavioral Health Resources in the Community: Intensive Outpatient Programs Organization         Address  Phone  Notes  [place]. [address], [place], [place] [phone]   [facility] Outpatient [address], [place], [place]   ADS: Alcohol & Drug Svcs [address], [place], [place]   [place] [address],  [place], [place] or [phone]     Substance Abuse Resources Organization         Address  Phone  Notes  Alcohol and Drug Services  [phone]   [place]  [phone]   [place]   Chinita Pester  [phone]   Residential & Outpatient Substance Abuse Program  [phone]   Psychological Services Organization         Address  Phone  Notes  [facility]  [place]  [phone]   [place] [address] [place]  [address] [place] or [phone]    Mobile Crisis Teams Organization         Address  Phone  Notes  [facility]rapeutic [facility]atives, Mobile Crisis Care Unit  [phone]   Assertive Psychotherapeutic Services  [address] [place], [place]   [place] Levels [address], [place] [phone]    Self-Help/Support Groups Organization         Address  Phone             Notes  [place]. of [place] - variety of support groups  [place] Call for more information  Narcotics Anonymous (NA), [facility] [address], [employer] [place]  2 meetings at this location   Special educational needs teacher         Address  Phone  Notes  ASAP Residential Treatment [place],    [place]  [phone]   [facility]  [address], [place] [phone], [place], [place]   [place], [place] [phone] Admissions: 8am-3pm M-F  Incentives Substance [place] [address]    [place], [place] [phone]   [facility] [address] [place], [place], [place]   [facility] [address],  [place], [place]   Insight Programs - Intensive Outpatient [address]een [address], [place], [place]   [facility] ([facility] [place].,  [place], [place] [phone] or [phone]   Residential Treatment  Services (RTS) [address] [place], [place] Accepts Medicaid  [place] [address]  [place] [phone] Substance Abuse/Addiction Treatment   [facility] Organization         Address  Phone  Notes  [facility]  [phone]   Domenic Schwab, PhD [address] Arlis Porta [place], [place]   [phone] or [phone]   [place], [place] [phone]   [facility] [address] [place], [place] [phone] Insurance/Medicaid/sponsorship through [place] and Families [address] [place]                                    [place], [place] [phone] [place] [address], [place] [phone]    Dr. Adele Schilder  [phone]   [facility] Dept. 1) [address] [place] 2) [place] 3)  [place] [address], [place] [phone]  [phone]   [place] [phone] or [phone] (After Hours)

## 2014-10-17 NOTE — Discharge Summary (Signed)
Name: Samuel Huffman MRN: [id number] DOB: February 15, 1966 49 y.o. PCP: No primary care provider on file.  Date of Admission: 10/01/2014  4:25 AM Date of Discharge: 10/03/14 Attending Physician: Dr Margarito Liner Discharge Diagnosis:  Principal Problem:   Acute recurrent pancreatitis Active Problems:   Elevated BP   AKI (acute kidney injury)   History of alcohol abuse   Lactic acidosis   High anion gap metabolic acidosis  Discharge Medications:   Medication List    TAKE these medications        folic acid 1 MG tablet  Commonly known as:  FOLVITE  Take 1 tablet (1 mg total) by mouth daily.     thiamine 100 MG tablet  Take 1 tablet (100 mg total) by mouth daily.        Disposition and follow-up:   Sam[facility] [place] [facility].  At the hospital follow up visit please address:  1.  Importance of maintaining abstinence from alcohol  2.  Labs / imaging needed at time of follow-up: none  3.  Pending labs/ test needing follow-up: none  Follow-up Appointments:   Discharge Instructions: Discharge Instructions    Call MD for:  persistant nausea and vomiting    Complete by:  As directed      Call MD for:  severe uncontrolled pain    Complete by:  As directed      Diet - low sodium heart healthy    Complete by:  As directed      Discharge instructions    Complete by:  As directed   Your magnesium, phosphorus, and potassium were low.  You will need to take a multivitamin daily that you may purchase from the pharmacy.  Also, I have given you a prescription for thiamine (vitamin B1) and folic acid from the pharmacy-->please pick these up from the pharmacy.  Please schedule a follow up appointment with a primary care physician.     Increase activity slowly    Complete by:  As directed           Procedures Performed:  US Abdomen Complete  10/01/2014   CLINICAL DATA:  Acute recurrent pancreatitis  EXAM: ULTRASOUND ABDOMEN  COMPLETE  COMPARISON:  None.  FINDINGS: Gallbladder: No gallstones or Montone thickening visualized. No sonographic Murphy sign noted.  Common bile duct: Diameter: 3.9 mm  Liver: No focal lesion identified. Increased hepatic parenchymal echogenicity.  IVC: No abnormality visualized.  Pancreas: Suboptimally visualized secondary to overlying bowel.  Spleen: Size and appearance within normal limits.  Right Kidney: Length: 10.6 cm. Echogenicity within normal limits. No mass or hydronephrosis visualized.  Left Kidney: Length: 11 cm. Echogenicity within normal limits. No mass or hydronephrosis visualized.  Abdominal aorta: No aneurysm visualized.  Other findings: Small amount of abdominal ascites.  IMPRESSION: 1. No cholelithiasis. 2. Suboptimally visualized pancreas secondary to overlying bowel gas. 3. Small amount of abdominal ascites. 4. Hepatic steatosis.   Electronically Signed   By: Elige Ko   On: 10/01/2014 11:19   Ct Angio Abd/pel W/ And/or W/o  10/01/2014   CLINICAL DATA:  48 year old male with abdominal and pelvic pain for 1 week. History of prior pancreatitis. Evaluate mesenteric arteries.  EXAM: CTA ABDOMEN AND PELVIS WITHOUT AND WITH CONTRAST  TECHNIQUE: Multidetector CT imaging of the abdomen and pelvis was performed using the standard protocol during bolus administration of intravenous contrast. Multiplanar reconstructed images and MIPs were obtained and reviewed to evaluate  the vascular anatomy.  CONTRAST:  80 cc intravenous Omnipaque 350  COMPARISON:  12/08/2013 CT  FINDINGS: This is a technically satisfactory study for evaluation of the mesenteric arterial system.  Lower chest: Mild circumferential distal esophageal Wh[place]ant thickening is noted.  Hepatobiliary: The liver and gallbladder are unremarkable. There is no evidence of biliary dilatation.  Pancreas: Mild -moderate peripancreatic inflammation is compatible with pancreatitis. A 4 cm pancreatic tail pseudo cysts and 2.5 cm pseudocyst along the  pancreatic head are identified. Smaller or areas of hypodensity within the pancreatic head are probably related pancreatitis/ fluid. Pole There is no evidence of pancreatic necrosis or venous thrombosis. A small amount of acute fluid in the retroperitoneum and very gastric regions noted.  Spleen: Unremarkable  Adrenals/Urinary Tract: The kidneys, adrenal glands and bladder are unremarkable.  Stomach/Bowel: Perigastric fluid/inflammation is noted. There is no evidence of bowel obstruction. There is no evidence of other bowel Rauda thickening or pneumatosis. The appendix is normal.  Vascular/Lymphatic: The celiac artery, SMA and IMA and visualized branches are patent. There is no evidence of mesenteric aneurysm or stenosis. Shotty peripancreatic lymph nodes are likely reactive.  Reproductive: The prostate is unremarkable.  Other: A small amount of ascites within the abdomen pelvis are noted.  Musculoskeletal: No acute or suspicious abnormalities are identified. A small left inguinal hernia containing fat is is unchanged. Irregularity of the proximal left femur is again identified.  Review of the MIP images confirms the above findings.  IMPRESSION: Mild-moderate pancreatitis without evidence of pancreatic necrosis or venous thrombosis. Status cysts along pancreatic head and pancreatic tail as described. Inflammatory changes are noted within the retroperitoneum and perigastric regions. Small amount of ascites within the abdomen and pelvis.  No evidence of mesenteric ischemia or mesenteric vasculature stenosis/aneurysm.  Diffuse distal esophageal Axley thickening likely representing esophagitis.  Unchanged small left inguinal hernia containing fat.   Electronically Signed   By: Harmon PierJeffrey  Hu M.D.   On: 10/01/2014 17:45   Admission HPI: Samuel Huffman is a 49 year old male with hx of alcohol abuse and prior episodes of pancreatitis--last one 11/2013 complicated by [facility] presenting to  the ED today with sudden onset of nausea and vomiting yesterday morning when he woke up followed by gradual worsening of epigastric abdominal pain now radiating to the back. The pain is constant, worsening today to the point where he has to the all EMS, and associated with nausea and vomiting. Unable to tolerate PO, threw up even water. Denies any recent alcohol use, says last use was July 2015, however noted to have elevated ethanol level of 261 in January 2016. He denies eating fatty foods and says he does not have any pain recently after eating. He denies any change in his routine, only using herbal medication (Rest Z) and chamomile but has not taken it for 1 week, and occasionally takes melatonin. He does not smoke but chews tobacco and denies illicit drug use.   Of note, he does report being stressed out with work lately. 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 He reports having an EEG done recently as requested by work but does not know results.   Hospital Course by problem list: Principal Problem:   Acute recurrent pancreatitis Active Problems:   Elevated BP   AKI (acute kidney injury)   History  of alcohol abuse   Lactic acidosis   High anion gap metabolic acidosis   Acute recurrent pancreatitis: Samuel Huffman presented with abdominal pain radiating to his back with associated nausea and emesis and had confirmed mild-moderate pancreatitis without evidence of pancreatic necrosis of venous thrombosis. CTA did note pseudocysts along pancreatic head and tail. Negative for mesenteric ischemia or vascular stenosis/aneurysm. He feels much improved the last two days of admission without nausea or emesis. Lactic acidosis has resolved and anion gap closed with aggressive hydration. He would like to progress his diet and wean analgesics to progress towards discharge which he did successfully.  The cause is unclear as he has no gallstones on CTA and denies any recent alcohol use. He was discharged without any pain medicine.  Hypokalemia: Samuel Huffman had K down to 2.9 likely secondary to emesis. This was supplemented with KDur. i  AKI: Presenting creatinine 1.37 went down to 0.71 prior to discharge after aggressive hydration. This was likely due to GI losses and poor po intake in setting of acute pancreatitis.  History of alcohol abuse: Samuel Huffman says he has not drank in about a year and his positive ethanol in January was due to taking nyquil. He was counseled on the dangers of drinking any alcohol for someone with recurrent pancreatitis. He was on CIWA protocol but did not require any benzodiazapine.   Discharge Vitals:   BP 124/76 mmHg  Pulse 83  Temp(Src) 98.8 F (37.1 C) (Oral)  Resp 16  Ht  (1.88 m)  Wt 190 lb (86.183 kg)  BMI 24.38 kg/m2  SpO2 98%  Discharge Physical Exam: Gen: A&O x 4, No acute distress, well developed, well nourished HEENT: Atraumatic, EOMI, sclerae anicteric, moist mucous membranes Heart: RRR, no murmurs, rubs, or gallops Lungs: Clear to auscultation bilaterally, respirations unlabored Abd: Soft, non-distended, + bowel sounds Ext: No edema or cyanosis   Signed: Lorenda Hatchet, MD 10/17/2014, 12:30 PM    Services Ordered on Discharge: none Equipment Ordered on Discharge: none

## 2015-09-26 DIAGNOSIS — G4700 Insomnia, unspecified: Secondary | ICD-10-CM | POA: Diagnosis not present

## 2015-09-26 DIAGNOSIS — F4310 Post-traumatic stress disorder, unspecified: Secondary | ICD-10-CM | POA: Diagnosis not present

## 2016-06-20 ENCOUNTER — Encounter (HOSPITAL_BASED_OUTPATIENT_CLINIC_OR_DEPARTMENT_OTHER): Payer: Self-pay | Admitting: *Deleted

## 2016-06-20 ENCOUNTER — Emergency Department (HOSPITAL_BASED_OUTPATIENT_CLINIC_OR_DEPARTMENT_OTHER)
Admission: EM | Admit: 2016-06-20 | Discharge: 2016-06-20 | Disposition: A | Discharge status: Home / Self Care | Payer: BLUE CROSS/BLUE SHIELD | Attending: Emergency Medicine | Admitting: Emergency Medicine

## 2016-06-20 DIAGNOSIS — F17290 Nicotine dependence, other tobacco product, uncomplicated: Secondary | ICD-10-CM | POA: Insufficient documentation

## 2016-06-20 DIAGNOSIS — K859 Acute pancreatitis without necrosis or infection, unspecified: Secondary | ICD-10-CM | POA: Diagnosis not present

## 2016-06-20 DIAGNOSIS — I10 Essential (primary) hypertension: Secondary | ICD-10-CM | POA: Diagnosis not present

## 2016-06-20 DIAGNOSIS — R1013 Epigastric pain: Secondary | ICD-10-CM | POA: Diagnosis present

## 2016-06-20 DIAGNOSIS — F17210 Nicotine dependence, cigarettes, uncomplicated: Secondary | ICD-10-CM | POA: Diagnosis not present

## 2016-06-20 HISTORY — DX: Anxiety disorder, unspecified: F41.9

## 2016-06-20 HISTORY — DX: Post-traumatic stress disorder, unspecified: F43.10

## 2016-06-20 LAB — CBC WITH DIFFERENTIAL/PLATELET
BASOS ABS: 0 10*3/uL (ref 0.0–0.1)
Basophils Relative: 0 %
Eosinophils Absolute: 0 10*3/uL (ref 0.0–0.7)
Eosinophils Relative: 0 %
HEMATOCRIT: 41.2 % (ref 39.0–52.0)
HEMOGLOBIN: 14.1 g/dL (ref 13.0–17.0)
LYMPHS ABS: 1.4 10*3/uL (ref 0.7–4.0)
LYMPHS PCT: 11 %
MCH: 33.2 pg (ref 26.0–34.0)
MCHC: 34.2 g/dL (ref 30.0–36.0)
MCV: 96.9 fL (ref 78.0–100.0)
Monocytes Absolute: 1.1 10*3/uL — ABNORMAL HIGH (ref 0.1–1.0)
Monocytes Relative: 9 %
NEUTROS ABS: 9.4 10*3/uL — AB (ref 1.7–7.7)
Neutrophils Relative %: 80 %
PLATELETS: 306 10*3/uL (ref 150–400)
RBC: 4.25 MIL/uL (ref 4.22–5.81)
RDW: 12.8 % (ref 11.5–15.5)
WBC: 11.9 10*3/uL — AB (ref 4.0–10.5)

## 2016-06-20 LAB — COMPREHENSIVE METABOLIC PANEL
ALT: 92 U/L — AB (ref 17–63)
AST: 260 U/L — AB (ref 15–41)
Albumin: 3.8 g/dL (ref 3.5–5.0)
Alkaline Phosphatase: 112 U/L (ref 38–126)
Anion gap: 13 (ref 5–15)
BUN: 26 mg/dL — ABNORMAL HIGH (ref 6–20)
CHLORIDE: 93 mmol/L — AB (ref 101–111)
CO2: 26 mmol/L (ref 22–32)
CREATININE: 1.02 mg/dL (ref 0.61–1.24)
Calcium: 10 mg/dL (ref 8.9–10.3)
GFR calc non Af Amer: >60 mL/min (ref >60)
Glucose, Bld: 123 mg/dL — ABNORMAL HIGH (ref 65–99)
POTASSIUM: 4.1 mmol/L (ref 3.5–5.1)
SODIUM: 132 mmol/L — AB (ref 135–145)
Total Bilirubin: 1.2 mg/dL (ref 0.3–1.2)
Total Protein: 7.8 g/dL (ref 6.5–8.1)

## 2016-06-20 LAB — URINALYSIS, ROUTINE W REFLEX MICROSCOPIC
Bilirubin Urine: NEGATIVE
Glucose, UA: NEGATIVE mg/dL
HGB URINE DIPSTICK: NEGATIVE
Ketones, ur: NEGATIVE mg/dL
Leukocytes, UA: NEGATIVE
Nitrite: NEGATIVE
PH: 7 (ref 5.0–8.0)
Protein, ur: NEGATIVE mg/dL
SPECIFIC GRAVITY, URINE: 1.018 (ref 1.005–1.030)

## 2016-06-20 LAB — ETHANOL: Alcohol, Ethyl (B): <5 mg/dL (ref <5)

## 2016-06-20 LAB — LIPASE, BLOOD: LIPASE: 129 U/L — AB (ref 11–51)

## 2016-06-20 MED ORDER — FENTANYL CITRATE (PF) 100 MCG/2ML IJ SOLN
50.0000 ug | INTRAMUSCULAR | Status: DC | PRN
  Administered 2016-06-20: 50 ug via INTRAVENOUS
  Filled 2016-06-20: qty 2

## 2016-06-20 MED ORDER — HYDROCODONE-ACETAMINOPHEN 5-325 MG PO TABS: 1.0000 | ORAL_TABLET | Freq: Four times a day (QID) | ORAL | 0 refills | Status: DC | PRN

## 2016-06-20 MED ORDER — HYDROCODONE-ACETAMINOPHEN 5-325 MG PO TABS
1.0000 | ORAL_TABLET | Freq: Once | ORAL | Status: AC
  Administered 2016-06-20: 1 via ORAL
  Filled 2016-06-20: qty 1

## 2016-06-20 MED ORDER — ONDANSETRON HCL 4 MG/2ML IJ SOLN
4.0000 mg | Freq: Once | INTRAMUSCULAR | Status: AC
  Administered 2016-06-20: 4 mg via INTRAVENOUS
  Filled 2016-06-20: qty 2

## 2016-06-20 MED ORDER — SODIUM CHLORIDE 0.9 % IV BOLUS (SEPSIS)
1000.0000 mL | Freq: Once | INTRAVENOUS | Status: AC
  Administered 2016-06-20: 1000 mL via INTRAVENOUS

## 2016-06-20 MED ORDER — KETOROLAC TROMETHAMINE 30 MG/ML IJ SOLN
30.0000 mg | Freq: Once | INTRAMUSCULAR | Status: AC
  Administered 2016-06-20: 30 mg via INTRAVENOUS
  Filled 2016-06-20: qty 1

## 2016-06-20 NOTE — ED Triage Notes (Signed)
Pt c/o diffuse abd pain x 12 hrs with vomitng

## 2016-06-20 NOTE — ED Notes (Signed)
Patient c/o generalized "cramping" abd pain that radiates into his back. Patient states that this started this morning. Patient reports a hx of pancreatitis, states this pain is not like the pain he had before. Patient denies drinking alcohol since @ 2 years ago when [facility]. Patient with 3 episodes of emesis today, all after attempting to drink. Patient reports taking ibuprofen earlier today but vomited after. Patient is A&Ox4.

## 2016-06-20 NOTE — ED Notes (Signed)
Pt verbalizes understanding of d/c instructions and denies any further needs at this time. 

## 2016-06-20 NOTE — ED Provider Notes (Signed)
[facility] Provider Note   CSN: [id number] Arrival date & time: 06/20/16  [id number]  By signing my name below, I, Samuel NeighborsMaurice Deon Copeland Jr., attest that this documentation has been prepared under the direction and in the presence of Lyndal Pulleyaniel Ami Mally, MD. Electronically signed: [name], ED Scribe. 06/20/16. 8:20 PM.    History   Chief Complaint Chief Complaint  Patient presents with  . Abdominal Pain    HPI  HPI Comments: Samuel Huffman is a 50 y.o. male with h/o pancreatitis who presents to the Emergency Department complaining of moderate to severe abdominal pain with sudden onset x15 hours. Pt states that at 5AM this morning he started experiencing a cramping abdominal pain that radiates to his back. Pt has reportedly taken Ibuprofen with no relief. Pt reports epigastric abdominal pain, back pain, appetite change, nausea, vomiting x3. Pt denies hx of cholecystomy, gallstones but endorses prior history of pancreatitis. This was brought on by alcohol abuse previously and he has been having exacerbations intermittently even after discontinuing drinking alcohol.    The history is provided by the patient. No language interpreter was used.    Past Medical History:  Diagnosis Date  . Hypertension   . Insomnia   . Pancreatitis     Patient Active Problem List   Diagnosis Date Noted  . Acute recurrent pancreatitis 10/01/2014  . Elevated BP 10/01/2014  . AKI (acute kidney injury) (HCC) 10/01/2014  . History of alcohol abuse 10/01/2014  . Lactic acidosis 10/01/2014  . High anion gap metabolic acidosis 10/01/2014  . Insomnia 07/05/2014    Past Surgical History:  Procedure Laterality Date  . FEMUR SURGERY         Home Medications    Prior to Admission medications   Medication Sig Start Date End Date Taking? Authorizing Provider  thiamine 100 MG tablet Take 1 tablet (100 mg total) by mouth daily. 10/03/14   Marrian SalvageJacquelyn S Gill, MD    Family  History Family History  Problem Relation Age of Onset  . Healthy Son     x1    Social History Social History  Substance Use Topics  . Smoking status: Current Every Day Smoker    Packs/day: 0.50    Types: Cigarettes  . Smokeless tobacco: Current User    Types: Snuff     Comment: chews tobacco as a child  . Alcohol use No     Comment: denies drinking since july 2015     Allergies   Patient has no known allergies.   Review of Systems Review of Systems  Constitutional: Positive for appetite change. Negative for chills and fever.  HENT: Negative for ear pain and sore throat.   Eyes: Negative for pain and visual disturbance.  Respiratory: Negative for cough and shortness of breath.   Cardiovascular: Negative for chest pain and palpitations.  Gastrointestinal: Positive for abdominal pain, nausea and vomiting.  Genitourinary: Negative for dysuria and hematuria.  Musculoskeletal: Positive for back pain. Negative for arthralgias.  Skin: Negative for color change and rash.  Neurological: Negative for seizures and syncope.  All other systems reviewed and are negative.    Physical Exam Updated Vital Signs BP (!) 141/115 (BP Location: Left Arm)   Pulse (!) 136   Temp 98 F (36.7 C)   Resp 16   Ht 6\' 2"  (1.88 m)   Wt 185 lb (83.9 kg)   SpO2 100%   BMI 23.75 kg/m   Physical Exam  Constitutional: He is  oriented to person, place, and time. He appears well-developed and well-nourished. No distress.  HENT:  Head: Normocephalic and atraumatic.  Nose: Nose normal.  Eyes: Conjunctivae are normal.  Neck: Neck supple. No tracheal deviation present.  Cardiovascular: Normal rate and regular rhythm.   Pulmonary/Chest: Effort normal. No respiratory distress.  Abdominal: Soft. He exhibits no distension. There is tenderness in the epigastric area.  Neurological: He is alert and oriented to person, place, and time.  Skin: Skin is warm and dry.  Psychiatric: He has a normal mood and  affect.     ED Treatments / Results   DIAGNOSTIC STUDIES: Oxygen Saturation is 100% on RA, normal by my interpretation.   COORDINATION OF CARE: 9:16 PM-Discussed next steps with pt. Pt verbalized understanding and is agreeable with the plan.    Labs (all labs ordered are listed, but only abnormal results are displayed) Labs Reviewed  COMPREHENSIVE METABOLIC PANEL - Abnormal; Notable for the following:       Result Value   Sodium 132 (*)    Chloride 93 (*)    Glucose, Bld 123 (*)    BUN 26 (*)    AST 260 (*)    ALT 92 (*)    All other components within normal limits  LIPASE, BLOOD - Abnormal; Notable for the following:    Lipase 129 (*)    All other components within normal limits  CBC WITH DIFFERENTIAL/PLATELET - Abnormal; Notable for the following:    WBC 11.9 (*)    Neutro Abs 9.4 (*)    Monocytes Absolute 1.1 (*)    All other components within normal limits  URINALYSIS, ROUTINE W REFLEX MICROSCOPIC  ETHANOL    EKG  EKG Interpretation None       Radiology No results found.  Procedures Procedures (including critical care time)  Medications Ordered in ED Medications  ondansetron (ZOFRAN) injection 4 mg (4 mg Intravenous Given 06/20/16 2029)  ketorolac (TORADOL) 30 MG/ML injection 30 mg (30 mg Intravenous Given 06/20/16 2036)  HYDROcodone-acetaminophen (NORCO/VICODIN) 5-325 MG per tablet 1 tablet (1 tablet Oral Given 06/20/16 2205)  sodium chloride 0.9 % bolus 1,000 mL (0 mLs Intravenous Stopped 06/20/16 2251)     Initial Impression / Assessment and Plan / ED Course  I have reviewed the triage vital signs and the nursing notes.  Pertinent labs & imaging results that were available during my care of the patient were reviewed by me and considered in my medical decision making (see chart for details).  Clinical Course     50 y.o. male presents with recurrent upper abdominal pain accompanied by vomiting. He has modestly elevated lipase and accompanied  by symptoms this suggests a mild idiopathic pancreatitis. Pain well controlled here and tolerated po. Recommended NPO status overnight and progression to clear liquids in the morning then bland food to follow. Suppled short course of narcotic analgesia, no recent prescriptions noted on database. Plan to follow up with PCP as needed and return precautions discussed for worsening or new concerning symptoms.   Final Clinical Impressions(s) / ED Diagnoses   Final diagnoses:  Acute pancreatitis without infection or necrosis, unspecified pancreatitis type    New Prescriptions Discharge Medication List as of 06/20/2016 11:10 PM    START taking these medications   Details  HYDROcodone-acetaminophen (NORCO/VICODIN) 5-325 MG tablet Take 1 tablet by mouth every 6 (six) hours as needed for severe pain., Starting Wed 06/20/2016, Print       I personally performed the services described in  this documentation, which was scribed in my presence. The recorded information has been reviewed and is accurate.      Lyndal Pulley, MD 06/21/16 [phone]

## 2016-06-21 ENCOUNTER — Encounter (HOSPITAL_BASED_OUTPATIENT_CLINIC_OR_DEPARTMENT_OTHER): Payer: Self-pay | Admitting: *Deleted

## 2016-06-21 ENCOUNTER — Emergency Department (HOSPITAL_BASED_OUTPATIENT_CLINIC_OR_DEPARTMENT_OTHER)
Admission: EM | Admit: 2016-06-21 | Discharge: 2016-06-21 | Disposition: A | Discharge status: Home / Self Care | Payer: BLUE CROSS/BLUE SHIELD | Attending: Emergency Medicine | Admitting: Emergency Medicine

## 2016-06-21 DIAGNOSIS — I10 Essential (primary) hypertension: Secondary | ICD-10-CM | POA: Diagnosis not present

## 2016-06-21 DIAGNOSIS — F17290 Nicotine dependence, other tobacco product, uncomplicated: Secondary | ICD-10-CM | POA: Diagnosis not present

## 2016-06-21 DIAGNOSIS — Z79899 Other long term (current) drug therapy: Secondary | ICD-10-CM | POA: Insufficient documentation

## 2016-06-21 DIAGNOSIS — R000 Tachycardia, unspecified: Secondary | ICD-10-CM | POA: Diagnosis not present

## 2016-06-21 DIAGNOSIS — R112 Nausea with vomiting, unspecified: Secondary | ICD-10-CM | POA: Insufficient documentation

## 2016-06-21 DIAGNOSIS — F17210 Nicotine dependence, cigarettes, uncomplicated: Secondary | ICD-10-CM | POA: Diagnosis not present

## 2016-06-21 LAB — CBC WITH DIFFERENTIAL/PLATELET
BASOS ABS: 0 10*3/uL (ref 0.0–0.1)
BASOS PCT: 0 %
EOS ABS: 0 10*3/uL (ref 0.0–0.7)
EOS PCT: 0 %
HCT: 39.9 % (ref 39.0–52.0)
Hemoglobin: 13.4 g/dL (ref 13.0–17.0)
Lymphocytes Relative: 9 %
Lymphs Abs: 0.5 10*3/uL — ABNORMAL LOW (ref 0.7–4.0)
MCH: 33.1 pg (ref 26.0–34.0)
MCHC: 33.6 g/dL (ref 30.0–36.0)
MCV: 98.5 fL (ref 78.0–100.0)
MONO ABS: 0.5 10*3/uL (ref 0.1–1.0)
MONOS PCT: 9 %
Neutro Abs: 4.9 10*3/uL (ref 1.7–7.7)
Neutrophils Relative %: 82 %
PLATELETS: 187 10*3/uL (ref 150–400)
RBC: 4.05 MIL/uL — ABNORMAL LOW (ref 4.22–5.81)
RDW: 12.5 % (ref 11.5–15.5)
WBC: 6 10*3/uL (ref 4.0–10.5)

## 2016-06-21 LAB — COMPREHENSIVE METABOLIC PANEL
ALBUMIN: 3.8 g/dL (ref 3.5–5.0)
ALT: 76 U/L — ABNORMAL HIGH (ref 17–63)
ANION GAP: 12 (ref 5–15)
AST: 175 U/L — AB (ref 15–41)
Alkaline Phosphatase: 107 U/L (ref 38–126)
BILIRUBIN TOTAL: 2.3 mg/dL — AB (ref 0.3–1.2)
BUN: 19 mg/dL (ref 6–20)
CHLORIDE: 93 mmol/L — AB (ref 101–111)
CO2: 28 mmol/L (ref 22–32)
Calcium: 9.1 mg/dL (ref 8.9–10.3)
Creatinine, Ser: 1.09 mg/dL (ref 0.61–1.24)
GFR calc Af Amer: >60 mL/min (ref >60)
GFR calc non Af Amer: >60 mL/min (ref >60)
GLUCOSE: 146 mg/dL — AB (ref 65–99)
POTASSIUM: 3.6 mmol/L (ref 3.5–5.1)
Sodium: 133 mmol/L — ABNORMAL LOW (ref 135–145)
TOTAL PROTEIN: 7.8 g/dL (ref 6.5–8.1)

## 2016-06-21 LAB — LIPASE, BLOOD: LIPASE: 56 U/L — AB (ref 11–51)

## 2016-06-21 MED ORDER — PROMETHAZINE HCL 25 MG RE SUPP: 25.0000 mg | Freq: Four times a day (QID) | RECTAL | 0 refills | Status: DC | PRN

## 2016-06-21 MED ORDER — FENTANYL CITRATE (PF) 100 MCG/2ML IJ SOLN
50.0000 ug | Freq: Once | INTRAMUSCULAR | Status: AC
  Administered 2016-06-21: 50 ug via INTRAVENOUS
  Filled 2016-06-21: qty 2

## 2016-06-21 MED ORDER — PROMETHAZINE HCL 25 MG/ML IJ SOLN
25.0000 mg | Freq: Once | INTRAMUSCULAR | Status: AC
  Administered 2016-06-21: 25 mg via INTRAVENOUS
  Filled 2016-06-21: qty 1

## 2016-06-21 MED ORDER — KETOROLAC TROMETHAMINE 15 MG/ML IJ SOLN
15.0000 mg | Freq: Once | INTRAMUSCULAR | Status: AC
  Administered 2016-06-21: 15 mg via INTRAVENOUS
  Filled 2016-06-21: qty 1

## 2016-06-21 MED ORDER — SODIUM CHLORIDE 0.9 % IV BOLUS (SEPSIS)
1000.0000 mL | Freq: Once | INTRAVENOUS | Status: AC
  Administered 2016-06-21: 1000 mL via INTRAVENOUS

## 2016-06-21 MED ORDER — FENTANYL CITRATE (PF) 100 MCG/2ML IJ SOLN: 100.0000 ug | Freq: Once | INTRAMUSCULAR | Status: DC

## 2016-06-21 MED ORDER — ONDANSETRON 4 MG PO TBDP: ORAL_TABLET | ORAL | 0 refills | Status: DC

## 2016-06-21 NOTE — ED Provider Notes (Signed)
[facility]   CSN: [id number] Arrival date & time: 06/21/16  1558     History   Chief Complaint Chief Complaint  Patient presents with  . Emesis    HPI Samuel Huffman is a 50 y.o. male.   Emesis   This is a recurrent problem. The current episode started 6 to 12 hours ago. The problem occurs 5 to 10 times per day. The problem has been gradually improving. The emesis has an appearance of stomach contents. There has been no fever. Associated symptoms include abdominal pain and chills.    Past Medical History:  Diagnosis Date  . Alcohol abuse   . Anxiety   . Hypertension   . Insomnia   . Pancreatitis   . PTSD (post-traumatic stress disorder)     Patient Active Problem List   Diagnosis Date Noted  . Acute recurrent pancreatitis 10/01/2014  . Elevated BP 10/01/2014  . AKI (acute kidney injury) (HCC) 10/01/2014  . History of alcohol abuse 10/01/2014  . Lactic acidosis 10/01/2014  . High anion gap metabolic acidosis 10/01/2014  . Insomnia 07/05/2014    Past Surgical History:  Procedure Laterality Date  . FEMUR SURGERY         Home Medications    Prior to Admission medications   Medication Sig Start Date End Date Taking? Authorizing Provider  HYDROcodone-acetaminophen (NORCO/VICODIN) 5-325 MG tablet Take 1 tablet by mouth every 6 (six) hours as needed for severe pain. 06/20/16   Lyndal Pulleyaniel Knott, MD  ondansetron (ZOFRAN ODT) 4 MG disintegrating tablet 4mg  ODT q4 hours prn nausea/vomit 06/21/16   Marily MemosJason Landyn Buckalew, MD  promethazine (PHENERGAN) 25 MG suppository Place 1 suppository (25 mg total) rectally every 6 (six) hours as needed for nausea or vomiting (if zofran not working). 06/21/16   Marily MemosJason Derrall Hicks, MD  thiamine 100 MG tablet Take 1 tablet (100 mg total) by mouth daily. 10/03/14   Marrian SalvageJacquelyn S Gill, MD    Family History Family History  Problem Relation Age of Onset  . Healthy Son     x1    Social History Social History  Substance Use  Topics  . Smoking status: Current Every Day Smoker    Packs/day: 0.50    Types: Cigarettes  . Smokeless tobacco: Current User    Types: Snuff     Comment: chews tobacco as a child  . Alcohol use No     Comment: denies drinking since july 2015     Allergies   Patient has no known allergies.   Review of Systems Review of Systems  Constitutional: Positive for chills.  Gastrointestinal: Positive for abdominal pain and vomiting.  All other systems reviewed and are negative.    Physical Exam Updated Vital Signs BP 151/96 (BP Location: Right Arm)   Pulse 84   Temp 99.5 F (37.5 C) (Oral)   Resp 18   Ht 6\' 2"  (1.88 m)   Wt 185 lb (83.9 kg)   SpO2 100%   BMI 23.75 kg/m   Physical Exam  Constitutional: He is oriented to person, place, and time. He appears well-developed and well-nourished.  HENT:  Head: Normocephalic and atraumatic.  Eyes: Conjunctivae and EOM are normal.  Neck: Normal range of motion.  Cardiovascular: Tachycardia present.   Pulmonary/Chest: Effort normal. No respiratory distress.  Abdominal: Soft. Bowel sounds are normal. He exhibits no distension. There is no tenderness. There is no guarding.  Musculoskeletal: Normal range of motion.  Neurological: He is alert and oriented to  person, place, and time.  Skin: Skin is warm and dry.  Nursing note and vitals reviewed.    ED Treatments / Results  Labs (all labs ordered are listed, but only abnormal results are displayed) Labs Reviewed  CBC WITH DIFFERENTIAL/PLATELET - Abnormal; Notable for the following:       Result Value   RBC 4.05 (*)    Lymphs Abs 0.5 (*)    All other components within normal limits  COMPREHENSIVE METABOLIC PANEL - Abnormal; Notable for the following:    Sodium 133 (*)    Chloride 93 (*)    Glucose, Bld 146 (*)    AST 175 (*)    ALT 76 (*)    Total Bilirubin 2.3 (*)    All other components within normal limits  LIPASE, BLOOD - Abnormal; Notable for the following:    Lipase  56 (*)    All other components within normal limits    EKG  EKG Interpretation None       Radiology No results found.  Procedures Procedures (including critical care time)  Medications Ordered in ED Medications  promethazine (PHENERGAN) injection 25 mg (25 mg Intravenous Given 06/21/16 1722)  sodium chloride 0.9 % bolus 1,000 mL (0 mLs Intravenous Stopped 06/21/16 1824)  ketorolac (TORADOL) 15 MG/ML injection 15 mg (15 mg Intravenous Given 06/21/16 1725)  fentaNYL (SUBLIMAZE) injection 50 mcg (50 mcg Intravenous Given 06/21/16 1727)     Initial Impression / Assessment and Plan / ED Course  I have reviewed the triage vital signs and the nursing notes.  Pertinent labs & imaging results that were available during my care of the patient were reviewed by me and considered in my medical decision making (see chart for details).  Clinical Course     50 yo here last night with pancreatitis and back with continued vomiting after going home last night. Pain not too bad, just sore from vomiting. Abdomen benign. Labs ok. Doubt worsening pancreatitis. Tolerating PO as well. Plan for dc on anti-emetics. Already has rx for pain meds at home to get filled if he needs it.   Final Clinical Impressions(s) / ED Diagnoses   Final diagnoses:  Nausea and vomiting, intractability of vomiting not specified, unspecified vomiting type    New Prescriptions New Prescriptions   ONDANSETRON (ZOFRAN ODT) 4 MG DISINTEGRATING TABLET    4mg  ODT q4 hours prn nausea/vomit   PROMETHAZINE (PHENERGAN) 25 MG SUPPOSITORY    Place 1 suppository (25 mg total) rectally every 6 (six) hours as needed for nausea or vomiting (if zofran not working).     Marily MemosJason Maymuna Detzel, MD 06/21/16 [phone]

## 2016-06-21 NOTE — ED Triage Notes (Signed)
Abdominal pain and vomiting. States he was here last night.

## 2016-08-01 ENCOUNTER — Encounter (HOSPITAL_BASED_OUTPATIENT_CLINIC_OR_DEPARTMENT_OTHER): Payer: Self-pay

## 2016-08-01 ENCOUNTER — Inpatient Hospital Stay (HOSPITAL_BASED_OUTPATIENT_CLINIC_OR_DEPARTMENT_OTHER)
Admission: EM | Admit: 2016-08-01 | Discharge: 2016-08-04 | DRG: 439 | Disposition: A | Discharge status: Home / Self Care | Payer: BLUE CROSS/BLUE SHIELD | Attending: Internal Medicine | Admitting: Internal Medicine

## 2016-08-01 ENCOUNTER — Emergency Department (HOSPITAL_BASED_OUTPATIENT_CLINIC_OR_DEPARTMENT_OTHER): Payer: BLUE CROSS/BLUE SHIELD

## 2016-08-01 DIAGNOSIS — K2210 Ulcer of esophagus without bleeding: Secondary | ICD-10-CM | POA: Diagnosis present

## 2016-08-01 DIAGNOSIS — E871 Hypo-osmolality and hyponatremia: Secondary | ICD-10-CM | POA: Diagnosis present

## 2016-08-01 DIAGNOSIS — I864 Gastric varices: Secondary | ICD-10-CM

## 2016-08-01 DIAGNOSIS — F17210 Nicotine dependence, cigarettes, uncomplicated: Secondary | ICD-10-CM | POA: Diagnosis not present

## 2016-08-01 DIAGNOSIS — R933 Abnormal findings on diagnostic imaging of other parts of digestive tract: Secondary | ICD-10-CM | POA: Diagnosis present

## 2016-08-01 DIAGNOSIS — R188 Other ascites: Secondary | ICD-10-CM | POA: Diagnosis not present

## 2016-08-01 DIAGNOSIS — K210 Gastro-esophageal reflux disease with esophagitis: Secondary | ICD-10-CM | POA: Diagnosis present

## 2016-08-01 DIAGNOSIS — R112 Nausea with vomiting, unspecified: Secondary | ICD-10-CM | POA: Diagnosis not present

## 2016-08-01 DIAGNOSIS — K920 Hematemesis: Secondary | ICD-10-CM | POA: Diagnosis not present

## 2016-08-01 DIAGNOSIS — K7460 Unspecified cirrhosis of liver: Secondary | ICD-10-CM | POA: Diagnosis not present

## 2016-08-01 DIAGNOSIS — F1020 Alcohol dependence, uncomplicated: Secondary | ICD-10-CM | POA: Diagnosis present

## 2016-08-01 DIAGNOSIS — E876 Hypokalemia: Secondary | ICD-10-CM | POA: Diagnosis present

## 2016-08-01 DIAGNOSIS — I10 Essential (primary) hypertension: Secondary | ICD-10-CM | POA: Diagnosis present

## 2016-08-01 DIAGNOSIS — K859 Acute pancreatitis without necrosis or infection, unspecified: Principal | ICD-10-CM | POA: Diagnosis present

## 2016-08-01 DIAGNOSIS — J9811 Atelectasis: Secondary | ICD-10-CM | POA: Diagnosis not present

## 2016-08-01 DIAGNOSIS — F4310 Post-traumatic stress disorder, unspecified: Secondary | ICD-10-CM | POA: Diagnosis present

## 2016-08-01 DIAGNOSIS — K3189 Other diseases of stomach and duodenum: Secondary | ICD-10-CM | POA: Diagnosis not present

## 2016-08-01 DIAGNOSIS — K861 Other chronic pancreatitis: Secondary | ICD-10-CM | POA: Diagnosis not present

## 2016-08-01 DIAGNOSIS — K922 Gastrointestinal hemorrhage, unspecified: Secondary | ICD-10-CM | POA: Diagnosis not present

## 2016-08-01 DIAGNOSIS — E860 Dehydration: Secondary | ICD-10-CM | POA: Diagnosis present

## 2016-08-01 DIAGNOSIS — R1013 Epigastric pain: Secondary | ICD-10-CM | POA: Diagnosis not present

## 2016-08-01 DIAGNOSIS — K8681 Exocrine pancreatic insufficiency: Secondary | ICD-10-CM | POA: Diagnosis present

## 2016-08-01 DIAGNOSIS — R911 Solitary pulmonary nodule: Secondary | ICD-10-CM | POA: Diagnosis not present

## 2016-08-01 DIAGNOSIS — K863 Pseudocyst of pancreas: Secondary | ICD-10-CM | POA: Diagnosis not present

## 2016-08-01 DIAGNOSIS — R1111 Vomiting without nausea: Secondary | ICD-10-CM | POA: Diagnosis present

## 2016-08-01 DIAGNOSIS — F1011 Alcohol abuse, in remission: Secondary | ICD-10-CM

## 2016-08-01 HISTORY — DX: Other diseases of stomach and duodenum: K31.89

## 2016-08-01 HISTORY — DX: Alcohol abuse, in remission: F10.11

## 2016-08-01 HISTORY — DX: Gastric varices: I86.4

## 2016-08-01 HISTORY — DX: Other diseases of stomach and duodenum: K76.6

## 2016-08-01 HISTORY — DX: Other ascites: R18.8

## 2016-08-01 HISTORY — DX: Unspecified cirrhosis of liver: K74.60

## 2016-08-01 HISTORY — DX: Gastrointestinal hemorrhage, unspecified: K92.2

## 2016-08-01 HISTORY — DX: Solitary pulmonary nodule: R91.1

## 2016-08-01 HISTORY — DX: Pseudocyst of pancreas: K86.3

## 2016-08-01 LAB — CBC
HCT: 38.4 % — ABNORMAL LOW (ref 39.0–52.0)
HEMOGLOBIN: 12.6 g/dL — AB (ref 13.0–17.0)
MCH: 32.9 pg (ref 26.0–34.0)
MCHC: 32.8 g/dL (ref 30.0–36.0)
MCV: 100.3 fL — AB (ref 78.0–100.0)
Platelets: 363 10*3/uL (ref 150–400)
RBC: 3.83 MIL/uL — AB (ref 4.22–5.81)
RDW: 12.6 % (ref 11.5–15.5)
WBC: 12.7 10*3/uL — ABNORMAL HIGH (ref 4.0–10.5)

## 2016-08-01 LAB — COMPREHENSIVE METABOLIC PANEL
ALK PHOS: 80 U/L (ref 38–126)
ALT: 14 U/L — AB (ref 17–63)
AST: 22 U/L (ref 15–41)
Albumin: 4.1 g/dL (ref 3.5–5.0)
Anion gap: 10 (ref 5–15)
BUN: 16 mg/dL (ref 6–20)
CALCIUM: 10.9 mg/dL — AB (ref 8.9–10.3)
CO2: 31 mmol/L (ref 22–32)
CREATININE: 0.87 mg/dL (ref 0.61–1.24)
Chloride: 94 mmol/L — ABNORMAL LOW (ref 101–111)
GFR calc Af Amer: >60 mL/min (ref >60)
Glucose, Bld: 159 mg/dL — ABNORMAL HIGH (ref 65–99)
Potassium: 3.6 mmol/L (ref 3.5–5.1)
SODIUM: 135 mmol/L (ref 135–145)
Total Bilirubin: 0.9 mg/dL (ref 0.3–1.2)
Total Protein: 8.3 g/dL — ABNORMAL HIGH (ref 6.5–8.1)

## 2016-08-01 LAB — LIPASE, BLOOD: Lipase: 137 U/L — ABNORMAL HIGH (ref 11–51)

## 2016-08-01 MED ORDER — ONDANSETRON HCL 4 MG/2ML IJ SOLN
4.0000 mg | Freq: Once | INTRAMUSCULAR | Status: AC
  Administered 2016-08-01: 4 mg via INTRAVENOUS
  Filled 2016-08-01: qty 2

## 2016-08-01 MED ORDER — METOCLOPRAMIDE HCL 5 MG/ML IJ SOLN
10.0000 mg | Freq: Once | INTRAMUSCULAR | Status: AC
  Administered 2016-08-01: 10 mg via INTRAVENOUS

## 2016-08-01 MED ORDER — IOPAMIDOL (ISOVUE-300) INJECTION 61%
100.0000 mL | Freq: Once | INTRAVENOUS | Status: AC | PRN
  Administered 2016-08-01: 100 mL via INTRAVENOUS

## 2016-08-01 MED ORDER — HYDROMORPHONE HCL 1 MG/ML IJ SOLN
1.0000 mg | Freq: Once | INTRAMUSCULAR | Status: AC
  Administered 2016-08-01: 1 mg via INTRAVENOUS
  Filled 2016-08-01: qty 1

## 2016-08-01 MED ORDER — SODIUM CHLORIDE 0.9 % IV BOLUS (SEPSIS)
1000.0000 mL | Freq: Once | INTRAVENOUS | Status: AC
  Administered 2016-08-01: 1000 mL via INTRAVENOUS

## 2016-08-01 MED ORDER — PANTOPRAZOLE SODIUM 40 MG IV SOLR
40.0000 mg | Freq: Once | INTRAVENOUS | Status: AC
  Administered 2016-08-01: 40 mg via INTRAVENOUS
  Filled 2016-08-01: qty 40

## 2016-08-01 MED ORDER — METOCLOPRAMIDE HCL 5 MG/ML IJ SOLN
INTRAMUSCULAR | Status: AC
  Filled 2016-08-01: qty 2

## 2016-08-01 NOTE — ED Triage Notes (Addendum)
C/o abd/back pain intermittent x 3 days- n/v x today-feels is pancreatitis-NAD-steady gait

## 2016-08-01 NOTE — ED Notes (Signed)
Large amount of coffee ground emesis noted to emesis bag

## 2016-08-01 NOTE — ED Provider Notes (Signed)
[facility] Provider Note   CSN: [id number] Arrival date & time: 08/01/16  1853  By signing my name below, I, Marnette Burgess Long, attest that this documentation has been prepared under the direction and in the presence of Renne Crigler, PA-C. Electronically Signed: Marnette Burgess Long, Scribe. 08/01/2016. 9:49 PM.   History   Chief Complaint Chief Complaint  Patient presents with  . Abdominal Pain    The history is provided by the patient. No language interpreter was used.    HPI Comments:  Samuel Huffman is a 51 y.o. male with a PMHx of Pancreatitis, HTN, and h/o EtOH abuse, who presents to the Emergency Department complaining of persistent, gradually worsening episodes of emesis onset <24 hours. Per pt, he notes his symptoms awoke him during the night last night around midnight and have gradually worsened since then. He notes 10+ episodes of emesis throughout the day today at work and PTA to [facility]. He has associated symptoms of upper back pain, LUQ abdominal pain, nausea, and cramping. Pt notes his pain feels similar to his ED visit five weeks ago. No h/o cholelithiasis. Pt reports eating fried chicken yesterday prior to the onset of his symptoms. No h/o of abdominal surgery or HLD. No insect bites that he has aware of. Pt denies any EtOH use since three months ago, stopped drinking heavily in 2015. Denies any other associated symptoms at this time.  *Update to HPI*  Pt has associated symptom of hematemesis x1, qualified as "coffee ground" in appearance.    Past Medical History:  Diagnosis Date  . Alcohol abuse   . Anxiety   . Hypertension   . Insomnia   . Pancreatitis   . PTSD (post-traumatic stress disorder)     Patient Active Problem List   Diagnosis Date Noted  . Acute recurrent pancreatitis 10/01/2014  . Elevated BP 10/01/2014  . AKI (acute kidney injury) (HCC) 10/01/2014  . History of alcohol abuse 10/01/2014  . Lactic acidosis 10/01/2014  . High anion gap  metabolic acidosis 10/01/2014  . Insomnia 07/05/2014    Past Surgical History:  Procedure Laterality Date  . FEMUR SURGERY         Home Medications    Prior to Admission medications   Not on File    Family History Family History  Problem Relation Age of Onset  . Healthy Son     x1    Social History Social History  Substance Use Topics  . Smoking status: Current Every Day Smoker    Packs/day: 0.50    Types: Cigarettes  . Smokeless tobacco: Current User    Types: Snuff  . Alcohol use No     Allergies   Patient has no known allergies.   Review of Systems Review of Systems  Constitutional: Negative for fever.  HENT: Negative for rhinorrhea and sore throat.   Eyes: Negative for redness.  Respiratory: Negative for cough.   Cardiovascular: Negative for chest pain.  Gastrointestinal: Positive for abdominal pain, nausea and vomiting (X1 hematemesis). Negative for blood in stool and diarrhea.  Genitourinary: Negative for dysuria.  Musculoskeletal: Positive for back pain and myalgias.  Skin: Negative for rash.  Neurological: Negative for headaches.     Physical Exam Updated Vital Signs BP (!) 149/107 (BP Location: Left Arm)   Pulse (!) 130 Comment: checked both hands and heart rate was the same  Temp 98.9 F (37.2 C) (Oral) Comment: RN Clydie Braun informed of Pts vitals  Resp 18   Ht  6\' 2"  (1.88 m)   Wt 190 lb (86.2 kg)   SpO2 100%   BMI 24.39 kg/m   Physical Exam  Constitutional: He is oriented to person, place, and time. He appears well-developed and well-nourished. He appears distressed.  HENT:  Head: Normocephalic and atraumatic.  Mouth/Throat: Oropharynx is clear and moist.  Eyes: Conjunctivae are normal. Right eye exhibits no discharge. Left eye exhibits no discharge.  Neck: Normal range of motion. Neck supple.  Cardiovascular: Normal rate, regular rhythm and normal heart sounds.   Pulmonary/Chest: Effort normal and breath sounds normal.  Abdominal:  Soft. He exhibits no distension. There is tenderness (moderate) in the right upper quadrant, epigastric area and left upper quadrant. There is guarding. There is no rebound.  Musculoskeletal: Normal range of motion.  Neurological: He is alert and oriented to person, place, and time.  Skin: Skin is warm and dry.  Psychiatric: He has a normal mood and affect.  Nursing note and vitals reviewed.    ED Treatments / Results  DIAGNOSTIC STUDIES:  Oxygen Saturation is 100% on RA, normal by my interpretation.    COORDINATION OF CARE:  9:05 PM Discussed treatment plan with pt at bedside including anti-emetics and CT of Abdomen and pt agreed to plan. Pt has had bilious vomiting in department.   Labs (all labs ordered are listed, but only abnormal results are displayed) Labs Reviewed  CBC - Abnormal; Notable for the following:       Result Value   WBC 12.7 (*)    RBC 3.83 (*)    Hemoglobin 12.6 (*)    HCT 38.4 (*)    MCV 100.3 (*)    All other components within normal limits  COMPREHENSIVE METABOLIC PANEL - Abnormal; Notable for the following:    Chloride 94 (*)    Glucose, Bld 159 (*)    Calcium 10.9 (*)    Total Protein 8.3 (*)    ALT 14 (*)    All other components within normal limits  LIPASE, BLOOD - Abnormal; Notable for the following:    Lipase 137 (*)    All other components within normal limits    EKG  EKG Interpretation None       Radiology Dg Chest 2 View  Result Date: 08/01/2016 CLINICAL DATA:  Hematemesis. Upper back pain for 2 days. History of pancreatitis and hypertension. EXAM: CHEST  2 VIEW COMPARISON:  None. FINDINGS: Shallow inspiration with slight linear atelectasis in the lung bases. 6 mm nodular opacity in the right apex. Normal heart size and pulmonary vascularity. No focal airspace disease or consolidation in the lungs. No blunting of costophrenic angles. No pneumothorax. Mediastinal contours appear intact. IMPRESSION: Shallow inspiration with linear  atelectasis in the lung bases. 6 mm nodular opacity in the right apex. Consider CT for further evaluation a current smoker. Electronically Signed   By: Burman Nieves M.D.   On: 08/01/2016 23:44   Ct Abdomen Pelvis W Contrast  Result Date: 08/01/2016 CLINICAL DATA:  Epigastric pain x3 days EXAM: CT ABDOMEN AND PELVIS WITH CONTRAST TECHNIQUE: Multidetector CT imaging of the abdomen and pelvis was performed using the standard protocol following bolus administration of intravenous contrast. CONTRAST:  ISOVUE-300 IOPAMIDOL (ISOVUE-300) INJECTION 61% COMPARISON:  CT from 10/01/2014 FINDINGS: Lower chest: Normal size cardiac chambers. No pericardial effusion. Small hiatal hernia. Atelectasis at each lung base and/or scarring. No pneumonic consolidation, effusion or pneumothorax. Hepatobiliary: Small left hepatic lobe. Subtle micronodular appearance of liver surface. No space-occupying mass or  biliary dilatation. Gallbladder is physiologically distended without calculus. Pancreas: Calcified pancreatic gland consistent with chronic pancreatitis. Probable pseudocyst at the tail of the pancreas near the splenic hilum. This measures 1.6 cm in diameter. No ductal dilatation. Spleen: No splenomegaly or focal mass. Adrenals/Urinary Tract: No adrenal mass. Tiny cyst in the upper pole the right kidney too small to further characterize measuring approximately 5 mm. No significant change from prior. Mild perinephric fat stranding bilaterally. Urinary bladder is physiologically distended without acute abnormality. Stomach/Bowel: Abnormally thickened appearance of the entirety of the stomach more focally along its posterior [place] where there is an area of hypodensity seen within measuring up to 2 cm. Small intramural abscess could have this appearance or developing ulcer. Necrotic intraluminal mass not excluded. Situated between the body of the pancreas and stomach is a 3.8 cm hypodensity which may represent a large  pseudocyst from previous bouts of pancreatitis. Mild inflammatory thickening is seen along duodenum bulb and descending portion of the duodenum. No small bowel dilatation. No bowel obstruction or free air. Moderate colonic stool burden is noted. There is abrupt narrowing the sigmoid colon on series 2, image 86 possibly from diffuse spasm. Stenosis not entirely excluded. Vascular/Lymphatic: No aortic aneurysm or significant atherosclerosis. Epigastric lymph nodes measuring up to 9 mm short axis are identified in the left upper quadrant. 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 Normal Reproductive: Mildly enlarged prostate measuring up to 5.8 cm. Other: No free air nor free fluid. Musculoskeletal: No acute osseous abnormality. IMPRESSION: 1. Diffusely thickened stomach with more focally thickened area along the posterior body of the stomach with a small 2 cm focus of hypodensity seen within. Findings may be related to gastritis with possible intramural abscess or developing ulcer. An infiltrating neoplasm such as lymphoma is not excluded. Direct visual correlation is recommended. 2. 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 3. Pseudocyst suspected interposed between the pancreatic gland and stomach as well as involving the pancreatic tail. Calcified pancreatic gland consistent chronic pancreatitis. 4. Abrupt caliber change involving the sigmoid colon possibly from diffuse sigmoid spasm. Stricture is not entirely excluded. Electronically Signed   By: Tollie Ethavid  Kwon M.D.   On: 08/01/2016 23:51    Procedures Procedures (including critical care time)  Medications Ordered in ED Medications  metoCLOPramide (REGLAN) 5 MG/ML injection (not administered)  HYDROmorphone (DILAUDID) injection 1 mg (not administered)  sodium chloride 0.9 % bolus 1,000 mL (0 mLs Intravenous Stopped 08/01/16 2049)  ondansetron (ZOFRAN) injection 4 mg (4 mg Intravenous Given 08/01/16 1930)  sodium chloride  0.9 % bolus 1,000 mL (0 mLs Intravenous Stopped 08/02/16 0036)  HYDROmorphone (DILAUDID) injection 1 mg (1 mg Intravenous Given 08/01/16 2115)  ondansetron (ZOFRAN) injection 4 mg (4 mg Intravenous Given 08/01/16 2115)  pantoprazole (PROTONIX) injection 40 mg (40 mg Intravenous Given 08/01/16 2131)  HYDROmorphone (DILAUDID) injection 1 mg (1 mg Intravenous Given 08/01/16 2201)  metoCLOPramide (REGLAN) injection 10 mg (10 mg Intravenous Given 08/01/16 2207)  iopamidol (ISOVUE-300) 61 % injection 100 mL (100 mLs Intravenous Contrast Given 08/01/16 2317)     Initial Impression / Assessment and Plan / ED Course  I have reviewed the triage vital signs and the nursing notes.  Pertinent labs & imaging results that were available during my care of the patient were reviewed by me and considered in my medical decision making (see chart for details).     Patient discussed with and seen by Dr. Ethelda ChickJacubowitz.   Discussed results with patient.  Recommended admission for further workup of CT findings, monitoring of upper GI bleeding.  12:56 AM Spoke with Dr. Clyde Lundborg, who will admit. Will transfer to [facility]. Pt stable.  Final Clinical Impressions(s) / ED Diagnoses   Final diagnoses:  Acute upper GI bleeding  Acute pancreatitis, unspecified complication status, unspecified pancreatitis type  Pancreatic pseudocyst  Pulmonary nodule   Admit.  New Prescriptions New Prescriptions   No medications on file   I personally performed the services described in this documentation, which was scribed in my presence. The recorded information has been reviewed and is accurate.     Renne Crigler, PA-C 08/02/16 4098    Doug Sou, MD 08/03/16 [phone]

## 2016-08-01 NOTE — ED Notes (Signed)
Patient transported to CT 

## 2016-08-01 NOTE — ED Provider Notes (Signed)
Plains of upper abdominal pain radiating to the back onset 72 hours ago. Accompanied by multiple episodes of vomiting. He reports that he vomited 12-14 times today. He reportedly vomited coffee grounds while here Pain feels like pancreatitis he's had in the past. On exam alert appears in mild distress. Abdomen tender with voluntary guarding over epigastrium lungs clear auscultation heart regular rate and rhythm, mildly tachycardic.   Doug SouSam Maripat Borba, MD 08/01/16 2358

## 2016-08-02 ENCOUNTER — Inpatient Hospital Stay (HOSPITAL_COMMUNITY): Payer: BLUE CROSS/BLUE SHIELD

## 2016-08-02 ENCOUNTER — Encounter (HOSPITAL_COMMUNITY): Payer: Self-pay

## 2016-08-02 DIAGNOSIS — R1013 Epigastric pain: Secondary | ICD-10-CM | POA: Diagnosis not present

## 2016-08-02 DIAGNOSIS — K3189 Other diseases of stomach and duodenum: Secondary | ICD-10-CM | POA: Diagnosis present

## 2016-08-02 DIAGNOSIS — K922 Gastrointestinal hemorrhage, unspecified: Secondary | ICD-10-CM | POA: Diagnosis present

## 2016-08-02 DIAGNOSIS — R911 Solitary pulmonary nodule: Secondary | ICD-10-CM | POA: Diagnosis present

## 2016-08-02 DIAGNOSIS — K920 Hematemesis: Secondary | ICD-10-CM | POA: Diagnosis present

## 2016-08-02 DIAGNOSIS — F17210 Nicotine dependence, cigarettes, uncomplicated: Secondary | ICD-10-CM | POA: Diagnosis present

## 2016-08-02 DIAGNOSIS — K7460 Unspecified cirrhosis of liver: Secondary | ICD-10-CM | POA: Diagnosis not present

## 2016-08-02 DIAGNOSIS — K8681 Exocrine pancreatic insufficiency: Secondary | ICD-10-CM | POA: Diagnosis present

## 2016-08-02 DIAGNOSIS — K861 Other chronic pancreatitis: Secondary | ICD-10-CM | POA: Diagnosis present

## 2016-08-02 DIAGNOSIS — F1020 Alcohol dependence, uncomplicated: Secondary | ICD-10-CM | POA: Diagnosis present

## 2016-08-02 DIAGNOSIS — I10 Essential (primary) hypertension: Secondary | ICD-10-CM | POA: Diagnosis not present

## 2016-08-02 DIAGNOSIS — R933 Abnormal findings on diagnostic imaging of other parts of digestive tract: Secondary | ICD-10-CM | POA: Diagnosis not present

## 2016-08-02 DIAGNOSIS — K859 Acute pancreatitis without necrosis or infection, unspecified: Secondary | ICD-10-CM | POA: Diagnosis not present

## 2016-08-02 DIAGNOSIS — K863 Pseudocyst of pancreas: Secondary | ICD-10-CM | POA: Diagnosis not present

## 2016-08-02 DIAGNOSIS — R112 Nausea with vomiting, unspecified: Secondary | ICD-10-CM | POA: Diagnosis not present

## 2016-08-02 DIAGNOSIS — E860 Dehydration: Secondary | ICD-10-CM | POA: Diagnosis present

## 2016-08-02 DIAGNOSIS — F4310 Post-traumatic stress disorder, unspecified: Secondary | ICD-10-CM | POA: Diagnosis present

## 2016-08-02 DIAGNOSIS — E871 Hypo-osmolality and hyponatremia: Secondary | ICD-10-CM | POA: Diagnosis present

## 2016-08-02 DIAGNOSIS — R1111 Vomiting without nausea: Secondary | ICD-10-CM | POA: Diagnosis present

## 2016-08-02 DIAGNOSIS — E876 Hypokalemia: Secondary | ICD-10-CM | POA: Diagnosis present

## 2016-08-02 DIAGNOSIS — K2210 Ulcer of esophagus without bleeding: Secondary | ICD-10-CM | POA: Diagnosis present

## 2016-08-02 DIAGNOSIS — K210 Gastro-esophageal reflux disease with esophagitis: Secondary | ICD-10-CM | POA: Diagnosis present

## 2016-08-02 DIAGNOSIS — I864 Gastric varices: Secondary | ICD-10-CM | POA: Diagnosis not present

## 2016-08-02 DIAGNOSIS — R188 Other ascites: Secondary | ICD-10-CM | POA: Diagnosis not present

## 2016-08-02 HISTORY — DX: Gastrointestinal hemorrhage, unspecified: K92.2

## 2016-08-02 LAB — LIPID PANEL
CHOLESTEROL: 152 mg/dL (ref 0–200)
HDL: 47 mg/dL (ref >40)
LDL Cholesterol: 81 mg/dL (ref 0–99)
TRIGLYCERIDES: 122 mg/dL (ref <150)
Total CHOL/HDL Ratio: 3.2 RATIO
VLDL: 24 mg/dL (ref 0–40)

## 2016-08-02 LAB — TYPE AND SCREEN
ABO/RH(D): O POS
ANTIBODY SCREEN: NEGATIVE

## 2016-08-02 LAB — CBC
HCT: 33.8 % — ABNORMAL LOW (ref 39.0–52.0)
HCT: 33.9 % — ABNORMAL LOW (ref 39.0–52.0)
HCT: 31 % — ABNORMAL LOW (ref 39.0–52.0)
Hemoglobin: 10.8 g/dL — ABNORMAL LOW (ref 13.0–17.0)
Hemoglobin: 11 g/dL — ABNORMAL LOW (ref 13.0–17.0)
Hemoglobin: 9.9 g/dL — ABNORMAL LOW (ref 13.0–17.0)
MCH: 32 pg (ref 26.0–34.0)
MCH: 32.5 pg (ref 26.0–34.0)
MCH: 32 pg (ref 26.0–34.0)
MCHC: 32 g/dL (ref 30.0–36.0)
MCHC: 32.4 g/dL (ref 30.0–36.0)
MCHC: 31.9 g/dL (ref 30.0–36.0)
MCV: 100.3 fL — ABNORMAL HIGH (ref 78.0–100.0)
MCV: 100.3 fL — ABNORMAL HIGH (ref 78.0–100.0)
MCV: 100.3 fL — AB (ref 78.0–100.0)
PLATELETS: 261 10*3/uL (ref 150–400)
PLATELETS: 247 10*3/uL (ref 150–400)
PLATELETS: 255 10*3/uL (ref 150–400)
RBC: 3.37 MIL/uL — AB (ref 4.22–5.81)
RBC: 3.38 MIL/uL — ABNORMAL LOW (ref 4.22–5.81)
RBC: 3.09 MIL/uL — AB (ref 4.22–5.81)
RDW: 13.2 % (ref 11.5–15.5)
RDW: 13.2 % (ref 11.5–15.5)
RDW: 13.1 % (ref 11.5–15.5)
WBC: 12.5 10*3/uL — ABNORMAL HIGH (ref 4.0–10.5)
WBC: 14.8 10*3/uL — AB (ref 4.0–10.5)
WBC: 13.3 10*3/uL — AB (ref 4.0–10.5)

## 2016-08-02 LAB — GLUCOSE, CAPILLARY: Glucose-Capillary: 107 mg/dL — ABNORMAL HIGH (ref 65–99)

## 2016-08-02 LAB — APTT: APTT: 34 s (ref 24–36)

## 2016-08-02 LAB — PROTIME-INR
INR: 1.09
Prothrombin Time: 14.2 seconds (ref 11.4–15.2)

## 2016-08-02 LAB — MRSA PCR SCREENING: MRSA by PCR: NEGATIVE

## 2016-08-02 LAB — ABO/RH: ABO/RH(D): O POS

## 2016-08-02 MED ORDER — SODIUM CHLORIDE 0.9 % IV SOLN
8.0000 mg/h | INTRAVENOUS | Status: DC
  Administered 2016-08-02: 8 mg/h via INTRAVENOUS
  Filled 2016-08-02 (×4): qty 80

## 2016-08-02 MED ORDER — SODIUM CHLORIDE 0.9% FLUSH
3.0000 mL | Freq: Two times a day (BID) | INTRAVENOUS | Status: DC
  Administered 2016-08-02 – 2016-08-04 (×4): 3 mL via INTRAVENOUS

## 2016-08-02 MED ORDER — ZOLPIDEM TARTRATE 5 MG PO TABS: 5.0000 mg | ORAL_TABLET | Freq: Every evening | ORAL | Status: DC | PRN

## 2016-08-02 MED ORDER — SODIUM CHLORIDE 0.9 % IV SOLN
Freq: Once | INTRAVENOUS | Status: AC
  Administered 2016-08-02: 01:00:00 via INTRAVENOUS

## 2016-08-02 MED ORDER — DEXTROSE 5 % IV SOLN
1.0000 g | Freq: Every day | INTRAVENOUS | Status: DC
  Administered 2016-08-02 – 2016-08-04 (×3): 1 g via INTRAVENOUS
  Filled 2016-08-02 (×3): qty 10

## 2016-08-02 MED ORDER — SODIUM CHLORIDE 0.9 % IV SOLN
INTRAVENOUS | Status: DC
  Administered 2016-08-02 – 2016-08-04 (×7): via INTRAVENOUS

## 2016-08-02 MED ORDER — SODIUM CHLORIDE 0.9 % IV SOLN
50.0000 ug/h | INTRAVENOUS | Status: DC
  Administered 2016-08-02: 50 ug/h via INTRAVENOUS
  Filled 2016-08-02 (×4): qty 1

## 2016-08-02 MED ORDER — PANTOPRAZOLE SODIUM 40 MG IV SOLR: 40.0000 mg | Freq: Two times a day (BID) | INTRAVENOUS | Status: DC

## 2016-08-02 MED ORDER — HYDROMORPHONE 1 MG/ML IV SOLN
INTRAVENOUS | Status: DC
  Administered 2016-08-02: 1 mg via INTRAVENOUS
  Administered 2016-08-02: 11:00:00 via INTRAVENOUS
  Administered 2016-08-02: 0.8 mg via INTRAVENOUS
  Administered 2016-08-03: 0 mg via INTRAVENOUS
  Administered 2016-08-03: 3.2 mg via INTRAVENOUS
  Administered 2016-08-03: 1.4 mg via INTRAVENOUS
  Administered 2016-08-04: 0.02 mg via INTRAVENOUS
  Filled 2016-08-02: qty 25

## 2016-08-02 MED ORDER — ONDANSETRON HCL 4 MG/2ML IJ SOLN
4.0000 mg | Freq: Three times a day (TID) | INTRAMUSCULAR | Status: AC | PRN
  Administered 2016-08-02: 4 mg via INTRAVENOUS
  Filled 2016-08-02: qty 2

## 2016-08-02 MED ORDER — DIPHENHYDRAMINE HCL 50 MG/ML IJ SOLN: 12.5000 mg | Freq: Four times a day (QID) | INTRAMUSCULAR | Status: DC | PRN

## 2016-08-02 MED ORDER — NALOXONE HCL 0.4 MG/ML IJ SOLN: 0.4000 mg | INTRAMUSCULAR | Status: DC | PRN

## 2016-08-02 MED ORDER — DIPHENHYDRAMINE HCL 12.5 MG/5ML PO ELIX: 12.5000 mg | ORAL_SOLUTION | Freq: Four times a day (QID) | ORAL | Status: DC | PRN

## 2016-08-02 MED ORDER — SODIUM CHLORIDE 0.9% FLUSH: 9.0000 mL | INTRAVENOUS | Status: DC | PRN

## 2016-08-02 MED ORDER — SODIUM CHLORIDE 0.9 % IV SOLN
Freq: Once | INTRAVENOUS | Status: AC
  Administered 2016-08-02: 06:00:00 via INTRAVENOUS

## 2016-08-02 MED ORDER — PROMETHAZINE HCL 25 MG/ML IJ SOLN
12.5000 mg | Freq: Once | INTRAMUSCULAR | Status: AC
  Administered 2016-08-02: 12.5 mg via INTRAVENOUS

## 2016-08-02 MED ORDER — PROMETHAZINE HCL 25 MG/ML IJ SOLN
INTRAMUSCULAR | Status: AC
  Filled 2016-08-02: qty 1

## 2016-08-02 MED ORDER — HYDRALAZINE HCL 20 MG/ML IJ SOLN
5.0000 mg | INTRAMUSCULAR | Status: DC | PRN
Start: 2016-08-02 — End: 2016-08-04

## 2016-08-02 MED ORDER — HYDROMORPHONE HCL 1 MG/ML IJ SOLN
1.0000 mg | Freq: Once | INTRAMUSCULAR | Status: AC
  Administered 2016-08-02: 1 mg via INTRAVENOUS
  Filled 2016-08-02: qty 1

## 2016-08-02 MED ORDER — NICOTINE 21 MG/24HR TD PT24
21.0000 mg | MEDICATED_PATCH | Freq: Every day | TRANSDERMAL | Status: DC
  Administered 2016-08-02 – 2016-08-04 (×3): 21 mg via TRANSDERMAL
  Filled 2016-08-02 (×3): qty 1

## 2016-08-02 MED ORDER — HYDROMORPHONE HCL 1 MG/ML IJ SOLN
1.0000 mg | INTRAMUSCULAR | Status: DC | PRN
  Administered 2016-08-02: 1 mg via INTRAVENOUS
  Filled 2016-08-02 (×2): qty 1

## 2016-08-02 MED ORDER — ONDANSETRON HCL 4 MG/2ML IJ SOLN: 4.0000 mg | Freq: Four times a day (QID) | INTRAMUSCULAR | Status: DC | PRN

## 2016-08-02 MED ORDER — OCTREOTIDE LOAD VIA INFUSION
50.0000 ug | Freq: Once | INTRAVENOUS | Status: AC
  Administered 2016-08-02: 50 ug via INTRAVENOUS
  Filled 2016-08-02: qty 25

## 2016-08-02 MED ORDER — PANTOPRAZOLE SODIUM 40 MG IV SOLR
40.0000 mg | Freq: Two times a day (BID) | INTRAVENOUS | Status: DC
  Administered 2016-08-02: 40 mg via INTRAVENOUS
  Filled 2016-08-02: qty 40

## 2016-08-02 MED ORDER — SODIUM CHLORIDE 0.9 % IV SOLN
80.0000 mg | Freq: Once | INTRAVENOUS | Status: DC
  Filled 2016-08-02: qty 80

## 2016-08-02 NOTE — ED Notes (Signed)
Attempted report 

## 2016-08-02 NOTE — Progress Notes (Addendum)
No beds at [facility], having Samuel Huffman re-routed to [facility].  Will put him in SDU as we do have an SDU available and I am concerned given coffee-ground emesis confirmed by an ED RN in an alcoholic with gastric varices showing up on his CT scan.  Will also put in orders for PPI GTT, octreotide, and rocephin per pharm for what is worrisome for possible variceal bleed.

## 2016-08-02 NOTE — Progress Notes (Signed)
Progress Note    Samuel Huffman  NWG:[id number] DOB: 1966-03-31  DOA: 08/01/2016 PCP: No PCP Per Patient    Brief Narrative:   Chief complaint: Follow-up hematemesis  Samuel Huffman is an 51 y.o. male with medical history significant of Hypertension, anxiety, PTSD, alcoholic pancreatitis, alcohol abuse, tobacco abuse, who Was admitted 08/01/16 with coffee ground emesis concerning for alcoholic gastritis or variceal bleed.  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 An infiltrating neoplasm such as lymphoma is not excluded.GI consulted with plans to proceed with endoscopy in the morning. Continue IV Protonix, octreotide, and empiric Rocephin. No further hematemesis overnight. 2 g drop in hemoglobin noted. We'll continue to check serial H&H's.   Active problems: Acute recurrent pancreatitis Lipase 137. CT scan showed pseudocyst suspected interposed between the pancreatic gland and stomach as well as involving the pancreatic tail.  Calcified pancreatic gland consistent chronic pancreatitis. Patient states has not had any alcohol for nearly 2 years. Due to uncontrolled pain, the patient was placed on PCA Dilaudid. No evidence of hypertriglyceridemia.  Tobacco abuse and hx of Alcohol abuse Continue nicotine patch. Counseled on tobacco cessation. Abstinent from alcohol for almost 2 years.  Essential hypertension: Continue IV hydralazine when necessary.  Mild hypercalcemia Calcium 10.9, likely due to dehydration. Continue to hydrate.  Pulmonary nodule Follow-up with PCP for routine surveillance.  Family Communication/Anticipated D/C date and plan/Code Status   DVT prophylaxis: SCDs ordered. Code Status: Full Code.  Family Communication: No family at bedside. Disposition Plan: Home when stable, likely another 24-48  hours.   Medical Consultants:    Gastroenterology.   Procedures:    None  Anti-Infectives:    Rocephin 08/02/16  Subjective:   Reports ongoing abdominal pain, pain is in the gastric area. Reports some relief with PCA Dilaudid. Less cramping now. Some nausea, but no frank vomiting. No further episodes of hematemesis.  Objective:    Vitals:   08/02/16 0441 08/02/16 0554 08/02/16 0730 08/02/16 0800  BP: 128/85  128/81 133/87  Pulse: 92 94 96 94  Resp: 12 15 16 12   Temp: 98.1 F (36.7 C)  98.7 F (37.1 C)   TempSrc: Oral  Oral   SpO2: 97% 97% 97% 95%  Weight: 91.9 kg (202 lb 9.6 oz)     Height: 6\' 2"  (1.88 m)       Intake/Output Summary (Last 24 hours) at 08/02/16 0837 Last data filed at 08/01/16 2135  Gross per 24 hour  Intake                0 ml  Output              600 ml  Net             -600 ml   Filed Weights   08/01/16 1904 08/02/16 0441  Weight: 86.2 kg (190 lb) 91.9 kg (202 lb 9.6 oz)    Exam: General exam: Appears calm and comfortable.  Respiratory system: Clear to auscultation. Respiratory effort normal. Cardiovascular system: S1 & S2 heard, RRR. No JVD,  rubs, gallops or clicks. No murmurs. Gastrointestinal system: Abdomen is nondistended, soft, tender in the midepigastric area. No organomegaly or masses felt. Normal bowel sounds heard. Central nervous system: Alert and oriented. No focal neurological deficits. Extremities: No clubbing,  or cyanosis. No edema. Skin: No rashes, lesions or ulcers.  Psychiatry: Judgement and insight appear normal. Mood & affect appropriate.   Data Reviewed:   I have personally reviewed following labs and imaging studies:  Labs: Basic Metabolic Panel:  Recent Labs Lab 08/01/16 1926  NA 135  K 3.6  CL 94*  CO2 31  GLUCOSE 159*  BUN 16  CREATININE 0.87  CALCIUM 10.9*   GFR Estimated Creatinine Clearance: 118.1 mL/min (by C-G formula based on SCr of 0.87 mg/dL). Liver Function Tests:  Recent Labs Lab  08/01/16 1926  AST 22  ALT 14*  ALKPHOS 80  BILITOT 0.9  PROT 8.3*  ALBUMIN 4.1    Recent Labs Lab 08/01/16 1926  LIPASE 137*   No results for input(s): AMMONIA in the last 168 hours. Coagulation profile No results for input(s): INR, PROTIME in the last 168 hours.  CBC:  Recent Labs Lab 08/01/16 1926 08/02/16 0607  WBC 12.7* 12.5*  HGB 12.6* 10.8*  HCT 38.4* 33.8*  MCV 100.3* 100.3*  PLT 363 261   Cardiac Enzymes: No results for input(s): CKTOTAL, CKMB, CKMBINDEX, TROPONINI in the last 168 hours. BNP (last 3 results) No results for input(s): PROBNP in the last 8760 hours. CBG:  Recent Labs Lab 08/02/16 0729  GLUCAP 107*   D-Dimer: No results for input(s): DDIMER in the last 72 hours. Hgb A1c: No results for input(s): HGBA1C in the last 72 hours. Lipid Profile:  Recent Labs  08/02/16 0607  CHOL 152  HDL 47  LDLCALC 81  TRIG 122  CHOLHDL 3.2   Thyroid function studies: No results for input(s): TSH, T4TOTAL, T3FREE, THYROIDAB in the last 72 hours.  Invalid input(s): FREET3 Anemia work up: No results for input(s): VITAMINB12, FOLATE, FERRITIN, TIBC, IRON, RETICCTPCT in the last 72 hours. Sepsis Labs:  Recent Labs Lab 08/01/16 1926 08/02/16 0607  WBC 12.7* 12.5*    Microbiology Recent Results (from the past 240 hour(s))  MRSA PCR Screening     Status: None   Collection Time: 08/02/16  4:38 AM  Result Value Ref Range Status   MRSA by PCR NEGATIVE NEGATIVE Final    Comment:        The GeneXpert MRSA Assay (FDA approved for NASAL specimens only), is one component of a comprehensive MRSA colonization surveillance program. It is not intended to diagnose MRSA infection nor to guide or monitor treatment for MRSA infections.     Radiology: Dg Chest 2 View  Result Date: 08/01/2016 CLINICAL DATA:  Hematemesis. Upper back pain for 2 days. History of pancreatitis and hypertension. EXAM: CHEST  2 VIEW COMPARISON:  None. FINDINGS: Shallow  inspiration with slight linear atelectasis in the lung bases. 6 mm nodular opacity in the right apex. Normal heart size and pulmonary vascularity. No focal airspace disease or consolidation in the lungs. No blunting of costophrenic angles. No pneumothorax. Mediastinal contours appear intact. IMPRESSION: Shallow inspiration with linear atelectasis in the lung bases. 6 mm nodular opacity in the right apex. Consider CT for further evaluation a current smoker. Electronically Signed   By: Burman Nieves M.D.   On: 08/01/2016 23:44   Ct Abdomen Pelvis W Contrast  Result Date: 08/01/2016 CLINICAL DATA:  Epigastric pain x3 days EXAM: CT ABDOMEN AND PELVIS WITH CONTRAST TECHNIQUE: Multidetector CT imaging of the abdomen and pelvis was performed using the standard protocol following bolus administration of intravenous contrast. CONTRAST:  ISOVUE-300 IOPAMIDOL (ISOVUE-300) INJECTION 61% COMPARISON:  CT from 10/01/2014 FINDINGS: Lower chest: Normal size cardiac chambers. No pericardial effusion. Small hiatal hernia. Atelectasis  at each lung base and/or scarring. No pneumonic consolidation, effusion or pneumothorax. Hepatobiliary: Small left hepatic lobe. Subtle micronodular appearance of liver surface. No space-occupying mass or biliary dilatation. Gallbladder is physiologically distended without calculus. Pancreas: Calcified pancreatic gland consistent with chronic pancreatitis. Probable pseudocyst at the tail of the pancreas near the splenic hilum. This measures 1.6 cm in diameter. No ductal dilatation. Spleen: No splenomegaly or focal mass. Adrenals/Urinary Tract: No adrenal mass. Tiny cyst in the upper pole the right kidney too small to further characterize measuring approximately 5 mm. No significant change from prior. Mild perinephric fat stranding bilaterally. Urinary bladder is physiologically distended without acute abnormality. Stomach/Bowel: Abnormally thickened appearance of the entirety of the stomach  more focally along its posterior [place] where there is an area of hypodensity seen within measuring up to 2 cm. Small intramural abscess could have this appearance or developing ulcer. Necrotic intraluminal mass not excluded. Situated between the body of the pancreas and stomach is a 3.8 cm hypodensity which may represent a large pseudocyst from previous bouts of pancreatitis. Mild inflammatory thickening is seen along duodenum bulb and descending portion of the duodenum. No small bowel dilatation. No bowel obstruction or free air. Moderate colonic stool burden is noted. There is abrupt narrowing the sigmoid colon on series 2, image 86 possibly from diffuse spasm. Stenosis not entirely excluded. Vascular/Lymphatic: No aortic aneurysm or significant atherosclerosis. Epigastric lymph nodes measuring up to 9 mm short axis are identified in the left upper quadrant. 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 Normal Reproductive: Mildly enlarged prostate measuring up to 5.8 cm. Other: No free air nor free fluid. Musculoskeletal: No acute osseous abnormality. IMPRESSION: 1. Diffusely thickened stomach with more focally thickened area along the posterior body of the stomach with a small 2 cm focus of hypodensity seen within. Findings may be related to gastritis with possible intramural abscess or developing ulcer. An infiltrating neoplasm such as lymphoma is not excluded. Direct visual correlation is recommended. 2. 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 3. Pseudocyst suspected interposed between the pancreatic gland and stomach as well as involving the pancreatic tail. Calcified pancreatic gland consistent chronic pancreatitis. 4. Abrupt caliber change involving the sigmoid colon possibly from diffuse sigmoid spasm. Stricture is not entirely excluded. Electronically Signed   By: Tollie Eth M.D.   On: 08/01/2016 23:51    Medications:   . cefTRIAXone (ROCEPHIN)  IV  1 g Intravenous  Q0600  . metoCLOPramide      . nicotine  21 mg Transdermal Daily  . octreotide  50 mcg Intravenous Once  . pantoprazole (PROTONIX) IVPB  80 mg Intravenous Once  . [START ON 08/05/2016] pantoprazole  40 mg Intravenous Q12H  . promethazine      . sodium chloride flush  3 mL Intravenous Q12H   Continuous Infusions: . octreotide  (SANDOSTATIN)    IV infusion    . pantoprozole (PROTONIX) infusion      Medical decision making is of high complexity and this patient is at high risk of deterioration, therefore this is a level 3 visit.  (> 4 problem points, >4 data points, high risk)   LOS: 0 days   RAMA,CHRISTINA  [facility] Pager [phone]. If unable to reach me by pager, please call my cell phone at [phone].  *Please refer to [email or web address], password [email or web address] to get updated schedule on who will round on this patient, as hospitalists switch teams weekly. If 7PM-7AM, please contact night-coverage at [email or web address],  password [email or web address] for any overnight needs.  08/02/2016, 8:37 AM

## 2016-08-02 NOTE — Progress Notes (Signed)
Pharmacy Antibiotic Note  Samuel Huffman is a 51 y.o. male admitted on 08/01/2016 with SBP prophylaxis in patient with UGIB.  Pharmacy has been consulted for Rocephin dosing.  Plan: Rocephin 1gm IV q24h Pharmacy will sign off - please reconsult if needed  Height: 6\' 2"  (188 cm) Weight: 190 lb (86.2 kg) IBW/kg (Calculated) : 82.2  Temp (24hrs), Avg:98.7 F (37.1 C), Min:98.5 F (36.9 C), Max:98.9 F (37.2 C)   Recent Labs Lab 08/01/16 1926  WBC 12.7*  CREATININE 0.87    Estimated Creatinine Clearance: 118.1 mL/min (by C-G formula based on SCr of 0.87 mg/dL).    No Known Allergies  Thank you for allowing pharmacy to be a part of this patient's care.  Samuel Huffman, PharmD, BCPS Clinical pharmacist, pager [phone] 08/02/2016 2:57 AM

## 2016-08-02 NOTE — H&P (Signed)
History and Physical    Samuel Huffman ZOX:[id number] DOB: 1965-10-21 DOA: 08/01/2016  Referring MD/NP/PA:   PCP: No PCP Per Patient   Patient coming from:  The patient is coming from home.  At baseline, pt is independent for most of ADL.   Chief Complaint: nausea, vomiting, abdominal pain and coffee ground hematemesis  HPI: Samuel Huffman is a 51 y.o. male with medical history significant of Hypertension, anxiety, PTSD, alcoholic pancreatitis, alcohol abuse, tobacco abuse, who presents with nausea, vomiting, abdominal pain and coffee ground hematemesis.  Patient states that he has been having nausea, vomiting and abdominal pain in the past 3 days. He vomited more than 10 times today. He had 3 episodes of coffee ground hematemesis today. His abdominal pain is located in the upper abdomen, constant, dull, 8 out of 10 in severity, radiating to the back. Patient does not have diarrhea, fever or chills. Patient denies chest pain, SOB, cough, symptoms of UTI or unilateral weakness. Patient states he did not drink alcohol for nearly 2 years.  ED Course: pt was found to have hemoglobin 12.6, WBC 12.7, electrolytes renal function okay, lipase 137, temperature normal, tachycardia, oxygen saturation 98% on room air. Patient is admitted to stepdown as inpatient.  # CT-abd/pelvis showed 1. Diffusely thickened stomach with more focally thickened area along the posterior body of the stomach with a small 2 cm focus of hypodensity seen within. Findings may be related to gastritis with possible intramural abscess or developing ulcer. An infiltrating neoplasm such as lymphoma is not excluded. Direct visual correlation is recommended. 2. 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 3. Pseudocyst suspected interposed between the pancreatic gland and stomach as well as involving the pancreatic tail. Calcified pancreatic gland consistent chronic pancreatitis. 4. Abrupt caliber change involving the  sigmoid colon possibly from diffuse sigmoid spasm. Stricture is not entirely excluded.  # CXR showed shallow inspiration with linear atelectasis in the lung bases. 6 mm nodular opacity in the right apex. Consider CT for further evaluation a current smoker.  Review of Systems:   General: no fevers, chills, no changes in body weight, has poor appetite, has fatigue HEENT: no blurry vision, hearing changes or sore throat Respiratory: no dyspnea, coughing, wheezing CV: no chest pain, no palpitations GI: has nausea, vomiting, abdominal pain, no diarrhea, constipation GU: no dysuria, burning on urination, increased urinary frequency, hematuria  Ext: no leg edema Neuro: no unilateral weakness, numbness, or tingling, no vision change or hearing loss Skin: no rash, no skin tear. MSK: No muscle spasm, no deformity, no limitation of range of movement in spin Heme: No easy bruising.  Travel history: No recent long distant travel.  Allergy: No Known Allergies  Past Medical History:  Diagnosis Date  . Alcohol abuse   . Anxiety   . Hypertension   . Insomnia   . Pancreatitis   . PTSD (post-traumatic stress disorder)     Past Surgical History:  Procedure Laterality Date  . FEMUR SURGERY      Social History:  reports that he has been smoking Cigarettes.  He has been smoking about 0.50 packs per day. His smokeless tobacco use includes Snuff. He reports that he does not drink alcohol or use drugs.  Family History:  Family History  Problem Relation Age of Onset  . Healthy Son     x1     Prior to Admission medications   Not on File    Physical Exam: Vitals:   08/02/16  0355 08/02/16 0400 08/02/16 0441 08/02/16 0554  BP: 143/91  128/85   Pulse: 100 85 92 94  Resp: 17 16 12 15   Temp:   98.1 F (36.7 C)   TempSrc:   Oral   SpO2: 99% 98% 97% 97%  Weight:   91.9 kg (202 lb 9.6 oz)   Height:   6\' 2"  (1.88 m)    General: Not in acute distress HEENT:       Eyes: PERRL, EOMI, no  scleral icterus.       ENT: No discharge from the ears and nose, no pharynx injection, no tonsillar enlargement.        Neck: No JVD, no bruit, no mass felt. Heme: No neck lymph node enlargement. Cardiac: S1/S2, RRR, No murmurs, No gallops or rubs. Respiratory:  No rales, wheezing, rhonchi or rubs. GI: Soft, nondistended, has tenderness in upper abdomen, no rebound pain, no organomegaly, BS present. GU: No hematuria Ext: No pitting leg edema bilaterally. 2+DP/PT pulse bilaterally. Musculoskeletal: No joint deformities, No joint redness or warmth, no limitation of ROM in spin. Skin: No rashes.  Neuro: Alert, oriented X3, cranial nerves II-XII grossly intact, moves all extremities normally. Psych: Patient is not psychotic, no suicidal or hemocidal ideation.  Labs on Admission: I have personally reviewed following labs and imaging studies  CBC:  Recent Labs Lab 08/01/16 1926  WBC 12.7*  HGB 12.6*  HCT 38.4*  MCV 100.3*  PLT 363   Basic Metabolic Panel:  Recent Labs Lab 08/01/16 1926  NA 135  K 3.6  CL 94*  CO2 31  GLUCOSE 159*  BUN 16  CREATININE 0.87  CALCIUM 10.9*   GFR: Estimated Creatinine Clearance: 118.1 mL/min (by C-G formula based on SCr of 0.87 mg/dL). Liver Function Tests:  Recent Labs Lab 08/01/16 1926  AST 22  ALT 14*  ALKPHOS 80  BILITOT 0.9  PROT 8.3*  ALBUMIN 4.1    Recent Labs Lab 08/01/16 1926  LIPASE 137*   No results for input(s): AMMONIA in the last 168 hours. Coagulation Profile: No results for input(s): INR, PROTIME in the last 168 hours. Cardiac Enzymes: No results for input(s): CKTOTAL, CKMB, CKMBINDEX, TROPONINI in the last 168 hours. BNP (last 3 results) No results for input(s): PROBNP in the last 8760 hours. HbA1C: No results for input(s): HGBA1C in the last 72 hours. CBG: No results for input(s): GLUCAP in the last 168 hours. Lipid Profile: No results for input(s): CHOL, HDL, LDLCALC, TRIG, CHOLHDL, LDLDIRECT in the  last 72 hours. Thyroid Function Tests: No results for input(s): TSH, T4TOTAL, FREET4, T3FREE, THYROIDAB in the last 72 hours. Anemia Panel: No results for input(s): VITAMINB12, FOLATE, FERRITIN, TIBC, IRON, RETICCTPCT in the last 72 hours. Urine analysis:    Component Value Date/Time   COLORURINE YELLOW 06/20/2016 1830   APPEARANCEUR CLEAR 06/20/2016 1830   LABSPEC 1.018 06/20/2016 1830   PHURINE 7.0 06/20/2016 1830   GLUCOSEU NEGATIVE 06/20/2016 1830   HGBUR NEGATIVE 06/20/2016 1830   BILIRUBINUR NEGATIVE 06/20/2016 1830   KETONESUR NEGATIVE 06/20/2016 1830   PROTEINUR NEGATIVE 06/20/2016 1830   UROBILINOGEN 0.2 10/01/2014 0539   NITRITE NEGATIVE 06/20/2016 1830   LEUKOCYTESUR NEGATIVE 06/20/2016 1830   Sepsis Labs: @LABRCNTIP (procalcitonin:4,lacticidven:4) )No results found for this or any previous visit (from the past 240 hour(s)).   Radiological Exams on Admission: Dg Chest 2 View  Result Date: 08/01/2016 CLINICAL DATA:  Hematemesis. Upper back pain for 2 days. History of pancreatitis and hypertension. EXAM: CHEST  2  VIEW COMPARISON:  None. FINDINGS: Shallow inspiration with slight linear atelectasis in the lung bases. 6 mm nodular opacity in the right apex. Normal heart size and pulmonary vascularity. No focal airspace disease or consolidation in the lungs. No blunting of costophrenic angles. No pneumothorax. Mediastinal contours appear intact. IMPRESSION: Shallow inspiration with linear atelectasis in the lung bases. 6 mm nodular opacity in the right apex. Consider CT for further evaluation a current smoker. Electronically Signed   By: Burman Nieves M.D.   On: 08/01/2016 23:44   Ct Abdomen Pelvis W Contrast  Result Date: 08/01/2016 CLINICAL DATA:  Epigastric pain x3 days EXAM: CT ABDOMEN AND PELVIS WITH CONTRAST TECHNIQUE: Multidetector CT imaging of the abdomen and pelvis was performed using the standard protocol following bolus administration of intravenous contrast.  CONTRAST:  ISOVUE-300 IOPAMIDOL (ISOVUE-300) INJECTION 61% COMPARISON:  CT from 10/01/2014 FINDINGS: Lower chest: Normal size cardiac chambers. No pericardial effusion. Small hiatal hernia. Atelectasis at each lung base and/or scarring. No pneumonic consolidation, effusion or pneumothorax. Hepatobiliary: Small left hepatic lobe. Subtle micronodular appearance of liver surface. No space-occupying mass or biliary dilatation. Gallbladder is physiologically distended without calculus. Pancreas: Calcified pancreatic gland consistent with chronic pancreatitis. Probable pseudocyst at the tail of the pancreas near the splenic hilum. This measures 1.6 cm in diameter. No ductal dilatation. Spleen: No splenomegaly or focal mass. Adrenals/Urinary Tract: No adrenal mass. Tiny cyst in the upper pole the right kidney too small to further characterize measuring approximately 5 mm. No significant change from prior. Mild perinephric fat stranding bilaterally. Urinary bladder is physiologically distended without acute abnormality. Stomach/Bowel: Abnormally thickened appearance of the entirety of the stomach more focally along its posterior [place] where there is an area of hypodensity seen within measuring up to 2 cm. Small intramural abscess could have this appearance or developing ulcer. Necrotic intraluminal mass not excluded. Situated between the body of the pancreas and stomach is a 3.8 cm hypodensity which may represent a large pseudocyst from previous bouts of pancreatitis. Mild inflammatory thickening is seen along duodenum bulb and descending portion of the duodenum. No small bowel dilatation. No bowel obstruction or free air. Moderate colonic stool burden is noted. There is abrupt narrowing the sigmoid colon on series 2, image 86 possibly from diffuse spasm. Stenosis not entirely excluded. Vascular/Lymphatic: No aortic aneurysm or significant atherosclerosis. Epigastric lymph nodes measuring up to 9 mm short axis are  identified in the left upper quadrant. 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 Normal Reproductive: Mildly enlarged prostate measuring up to 5.8 cm. Other: No free air nor free fluid. Musculoskeletal: No acute osseous abnormality. IMPRESSION: 1. Diffusely thickened stomach with more focally thickened area along the posterior body of the stomach with a small 2 cm focus of hypodensity seen within. Findings may be related to gastritis with possible intramural abscess or developing ulcer. An infiltrating neoplasm such as lymphoma is not excluded. Direct visual correlation is recommended. 2. 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 3. Pseudocyst suspected interposed between the pancreatic gland and stomach as well as involving the pancreatic tail. Calcified pancreatic gland consistent chronic pancreatitis. 4. Abrupt caliber change involving the sigmoid colon possibly from diffuse sigmoid spasm. Stricture is not entirely excluded. Electronically Signed   By: Tollie Eth M.D.   On: 08/01/2016 23:51     EKG: Not done in ED, will get one.   Assessment/Plan Principal Problem:   Upper GI bleed Active Problems:   Acute recurrent pancreatitis  History of alcohol abuse   Hematemesis   Essential hypertension   Acute upper GI bleeding   Pancreatic pseudocyst   Pulmonary nodule   Upper GI bleed: Hgb 12.6. Hemodynamically stable.  CT scan showed possible mild cirrhosis of the liver there with gastric varices; gastritis with possible intramural abscess or developing ulcer. An infiltrating neoplasm such as lymphoma is not excluded.  - will admit to SDU as inpt - NPO for possible EGD - IVF: 2L NS, and then 125 mL/hr - Start IV pantoprazole gtt and Octreotide IV infusion -- Zofran IV for nausea and morphine for pain - Avoid NSAIDs and SQ heparin - Maintain IV access (2 large bore IVs if possible). - Monitor closely and follow q6h cbc, transfuse as necessary. - LaB:  INR, PTT, type/screen - IV Rocephin started - please call GI as above  Acute recurrent pancreatitis: lipase 137. CT scan showed pseudocyst suspected interposed between the pancreatic gland and stomach as well as involving the pancreatic tail.  Calcified pancreatic gland consistent chronic pancreatitis. Patient states he did not drink alcohol for nearly 2 years. - Zofran IV for nausea and morphine for pain - IVF as above -check lipid profile to r/o hypertriglyceridemia  Tobacco abuse and hx of Alcohol abuse: Patient states he did not drink alcohol for nearly 2 years. -Did counseling about importance of quitting smoking -Nicotine patch  Essential hypertension: -IV hydralazine when necessary  Mild hypercalcemia: Calcium 10.9, likely due to dehydration. -IV fluid as above -Follow-up by BMP  Pulmonary nodule: -f/u with PCP  DVT ppx: SCD Code Status: Full code Family Communication: None at bed side. Disposition Plan:  Anticipate discharge back to previous home environment Consults called: none  Admission status: SDU/inpation       Date of Service 08/02/2016    Lorretta Harp [facility] Pager [phone]  If 7PM-7AM, please contact night-coverage [email or web address] Password [email or web address] 08/02/2016, 6:12 AM
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
## 2016-08-02 NOTE — Progress Notes (Signed)
This is a no charge note  [facility] physician, Geraldo DockerJash  51 year old male with past medical history of alcohol abuse, tobacco abuse, pancreatitis, PTSD, hypertension, anxiety, who presents with nausea, vomiting, abdominal pain intermittently for 3 days. Patient coughs up coffee ground material in ED. Patient states that he did not use alcohol in the past 3 months. Lipase was 137, electrolytes renal function okay, WBC 12.7, temperature normal, O2 sats 100% on room air. Pt is accepted to tele bed as inpt.  CT-abdomen/pelvis showed:   1. Diffusely thickened stomach with more focally thickened area along the posterior body of the stomach with a small 2 cm focus of hypodensity seen within. Findings may be related to gastritis with possible intramural abscess or developing ulcer. An infiltrating neoplasm such as lymphoma is not excluded. Direct visual correlation is recommended. 2. 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 3. Pseudocyst suspected interposed between the pancreatic gland and stomach as well as involving the pancreatic tail. Calcified pancreatic gland consistent chronic pancreatitis. 4. Abrupt caliber change involving the sigmoid colon possibly from diffuse sigmoid spasm. Stricture is not entirely excluded.  [name], MD  [facility] Pager [phone]  If 7PM-7AM, please contact night-coverage [email or web address] Password [email or web address] 08/02/2016, 12:49 AM

## 2016-08-03 ENCOUNTER — Encounter (HOSPITAL_COMMUNITY): Payer: Self-pay

## 2016-08-03 ENCOUNTER — Inpatient Hospital Stay (HOSPITAL_COMMUNITY): Payer: BLUE CROSS/BLUE SHIELD | Admitting: Anesthesiology

## 2016-08-03 ENCOUNTER — Telehealth: Payer: Self-pay

## 2016-08-03 ENCOUNTER — Encounter (HOSPITAL_COMMUNITY)
Admission: EM | Disposition: A | Discharge status: Home / Self Care | Payer: Self-pay | Source: Home / Self Care | Attending: Internal Medicine

## 2016-08-03 DIAGNOSIS — Z87898 Personal history of other specified conditions: Secondary | ICD-10-CM

## 2016-08-03 DIAGNOSIS — R933 Abnormal findings on diagnostic imaging of other parts of digestive tract: Secondary | ICD-10-CM

## 2016-08-03 DIAGNOSIS — K920 Hematemesis: Secondary | ICD-10-CM

## 2016-08-03 DIAGNOSIS — I10 Essential (primary) hypertension: Secondary | ICD-10-CM

## 2016-08-03 DIAGNOSIS — I864 Gastric varices: Secondary | ICD-10-CM

## 2016-08-03 DIAGNOSIS — R911 Solitary pulmonary nodule: Secondary | ICD-10-CM

## 2016-08-03 HISTORY — PX: ESOPHAGOGASTRODUODENOSCOPY (EGD) WITH PROPOFOL: SHX5813

## 2016-08-03 HISTORY — PX: EUS: SHX5427

## 2016-08-03 LAB — BASIC METABOLIC PANEL
Anion gap: 10 (ref 5–15)
BUN: 13 mg/dL (ref 6–20)
CHLORIDE: 96 mmol/L — AB (ref 101–111)
CO2: 27 mmol/L (ref 22–32)
Calcium: 8.1 mg/dL — ABNORMAL LOW (ref 8.9–10.3)
Creatinine, Ser: 0.95 mg/dL (ref 0.61–1.24)
GFR calc non Af Amer: >60 mL/min (ref >60)
Glucose, Bld: 101 mg/dL — ABNORMAL HIGH (ref 65–99)
POTASSIUM: 3.8 mmol/L (ref 3.5–5.1)
Sodium: 133 mmol/L — ABNORMAL LOW (ref 135–145)

## 2016-08-03 LAB — CBC
HCT: 31.5 % — ABNORMAL LOW (ref 39.0–52.0)
HEMOGLOBIN: 9.9 g/dL — AB (ref 13.0–17.0)
MCH: 31.9 pg (ref 26.0–34.0)
MCHC: 31.4 g/dL (ref 30.0–36.0)
MCV: 101.6 fL — ABNORMAL HIGH (ref 78.0–100.0)
Platelets: 263 10*3/uL (ref 150–400)
RBC: 3.1 MIL/uL — AB (ref 4.22–5.81)
RDW: 13.1 % (ref 11.5–15.5)
WBC: 11.8 10*3/uL — ABNORMAL HIGH (ref 4.0–10.5)

## 2016-08-03 LAB — GLUCOSE, CAPILLARY: Glucose-Capillary: 96 mg/dL (ref 65–99)

## 2016-08-03 SURGERY — ESOPHAGOGASTRODUODENOSCOPY (EGD) WITH PROPOFOL
Anesthesia: Monitor Anesthesia Care

## 2016-08-03 MED ORDER — PANTOPRAZOLE SODIUM 40 MG IV SOLR
40.0000 mg | Freq: Every day | INTRAVENOUS | Status: DC
  Administered 2016-08-04: 40 mg via INTRAVENOUS
  Filled 2016-08-03: qty 40

## 2016-08-03 MED ORDER — PROPOFOL 500 MG/50ML IV EMUL
INTRAVENOUS | Status: DC | PRN
  Administered 2016-08-03: 125 ug/kg/min via INTRAVENOUS

## 2016-08-03 MED ORDER — LACTATED RINGERS IV SOLN
INTRAVENOUS | Status: DC | PRN
  Administered 2016-08-03: 10:00:00 via INTRAVENOUS

## 2016-08-03 MED ORDER — FENTANYL CITRATE (PF) 100 MCG/2ML IJ SOLN: 25.0000 ug | INTRAMUSCULAR | Status: DC | PRN

## 2016-08-03 MED ORDER — PROPOFOL 10 MG/ML IV BOLUS
INTRAVENOUS | Status: DC | PRN
  Administered 2016-08-03: 30 mg via INTRAVENOUS
  Administered 2016-08-03: 15 mg via INTRAVENOUS

## 2016-08-03 MED ORDER — SODIUM CHLORIDE 0.9 % IV SOLN: INTRAVENOUS | Status: DC

## 2016-08-03 MED ORDER — PROMETHAZINE HCL 25 MG/ML IJ SOLN: 6.2500 mg | INTRAMUSCULAR | Status: DC | PRN

## 2016-08-03 NOTE — Anesthesia Postprocedure Evaluation (Signed)
Anesthesia Post Note  Patient: Samuel Huffman  Procedure(s) Performed: Procedure(s) (LRB): ESOPHAGOGASTRODUODENOSCOPY (EGD) WITH PROPOFOL (N/A) UPPER ENDOSCOPIC ULTRASOUND (EUS) LINEAR  Patient location during evaluation: Endoscopy Anesthesia Type: MAC Level of consciousness: awake and alert Pain management: pain level controlled Vital Signs Assessment: post-procedure vital signs reviewed and stable Respiratory status: spontaneous breathing, nonlabored ventilation, respiratory function stable and patient connected to nasal cannula oxygen Cardiovascular status: stable and blood pressure returned to baseline Anesthetic complications: no       Last Vitals:  Vitals:   08/03/16 0800 08/03/16 1029  BP: 110/69 93/63  Pulse: 85 93  Resp: 12 16  Temp:  36.8 C    Last Pain:  Vitals:   08/03/16 1029  TempSrc: Oral  PainSc:                  Catalina Gravel

## 2016-08-03 NOTE — Anesthesia Preprocedure Evaluation (Addendum)
Anesthesia Evaluation  Patient identified by MRN, date of birth, ID band Patient awake    Reviewed: Allergy & Precautions, NPO status , Patient's Chart, lab work & pertinent test results  Airway Mallampati: II  TM Distance: >3 FB Neck ROM: Full    Dental  (+) Teeth Intact, Dental Advisory Given   Pulmonary Current Smoker,    Pulmonary exam normal breath sounds clear to auscultation       Cardiovascular hypertension, Normal cardiovascular exam Rhythm:Regular Rate:Normal     Neuro/Psych PSYCHIATRIC DISORDERS Anxiety negative neurological ROS     GI/Hepatic negative GI ROS, (+)     substance abuse  alcohol use, Pancreatitis    Endo/Other  negative endocrine ROS  Renal/GU negative Renal ROS     Musculoskeletal negative musculoskeletal ROS (+)   Abdominal   Peds  Hematology  (+) Blood dyscrasia, anemia ,   Anesthesia Other Findings Day of surgery medications reviewed with the patient.  Reproductive/Obstetrics                             Anesthesia Physical Anesthesia Plan  ASA: II  Anesthesia Plan: MAC   Post-op Pain Management:    Induction: Intravenous  Airway Management Planned: Simple Face Mask  Additional Equipment:   Intra-op Plan:   Post-operative Plan:   Informed Consent: I have reviewed the patients History and Physical, chart, labs and discussed the procedure including the risks, benefits and alternatives for the proposed anesthesia with the patient or authorized representative who has indicated his/her understanding and acceptance.   Dental advisory given  Plan Discussed with: CRNA  Anesthesia Plan Comments: (Discussed risks/benefits/alternatives to MAC sedation including need for ventilatory support, hypotension, need for conversion to general anesthesia.  All patient questions answered.  Patient/guardian wishes to proceed.)       Anesthesia Quick  Evaluation

## 2016-08-03 NOTE — Op Note (Signed)
[facility] Patient Name: Samuel Huffman Procedure Date : 08/03/2016 MRN: [id number] Attending MD: Rachael Fee , MD Date of Birth: 1965/08/16 CSN: [id number] Age: 51 Admit Type: Inpatient Procedure:                Upper GI endoscopy Indications:              Abnormal CT of the stomach (lesion within the Mcl[place]                            of the stomach), has known chronic calcific                            pancreatitis from previous Etoh Abuse; currently                            admitted wtih acute on chronic pancreatitis that                            may be due to viral infection last week since he no                            longer drinks and has no stones in GB. Providers:                Rachael Fee, MD, Will Bonnet RN, RN,                            Oletha Blend, Technician Referring MD:              Medicines:                Monitored Anesthesia Care Complications:            No immediate complications. Estimated blood loss:                            None. Estimated Blood Loss:     Estimated blood loss: none. Procedure:                Pre-Anesthesia Assessment:                           - Prior to the procedure, a History and Physical                            was performed, and patient medications and                            allergies were reviewed. The patient's tolerance of                            previous anesthesia was also reviewed. The risks                            and benefits of the procedure and the sedation  options and risks were discussed with the patient.                            All questions were answered, and informed consent                            was obtained. Prior Anticoagulants: The patient has                            taken no previous anticoagulant or antiplatelet                            agents. ASA Grade Assessment: III - A patient with                            severe  systemic disease. After reviewing the risks                            and benefits, the patient was deemed in                            satisfactory condition to undergo the procedure.                           After obtaining informed consent, the endoscope was                            passed under direct vision. Throughout the                            procedure, the patient's blood pressure, pulse, and                            oxygen saturations were monitored continuously. The                            [id number] ([id number]) scope was introduced through the                            mouth, and advanced to the second part of duodenum.                            The [id number] ([id number]) scope was introduced                            through the and advanced to the. The upper GI                            endoscopy was accomplished without difficulty. The                            patient tolerated the procedure well. Scope In: Scope Out: Findings:      EGD findings:  There was mild, erosive reflux esophagitis.      There were gastric varices in the proximal stomach; none with signs of       recent bleeding.      The Kaffenberger of the stomach was edematous, especially distally. Appeared       consistent with mild portal gastropathy. No sign of neoplasm.      The duodenum was normal.      EUS findings (limited exam):      The hypoechoic lesion in the [place] of the stomach described on recent CT       was noted. This is a 2.5cm fluid collection with surrounding gastric       Girgenti edema. It was not sampled. Impression:               - Mild reflux esophagitis.                           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                           - Portal gastropathy, mild.                           - The intramural lesion described on recent CT is                            almost certainly an 'ectopic' pancreatic                            pseudocyst. [facility]                            years [facility] show a much larger fluid collection in                            this same general region of gastric Dains. Moderate Sedation:      none Recommendation:           - Return patient to hospital ward for ongoing care                            of his mild, acute on chronic pancreatitis; IV                            fluids, NPO. Hopefully he can advance to clears                            tomorrow.                           - At discharge he should continue PPI (once daily,                            OTC strenght is fine) for his reflux esophagitis.  Will change from BID IV to once daily IV for now.                           - My office will contact him about establishing                            care as an outpatient; will likely start                            non-selective B blocker at that point.                           - He understands that he should continue to                            completely avoid Etoh as he has been doing lately. Procedure Code(s):        --- Professional ---                           [phone], Esophagogastroduodenoscopy, flexible,                            transoral; diagnostic, including collection of                            specimen(s) by brushing or washing, when performed                            (separate procedure) Diagnosis Code(s):        --- Professional ---                           R93.3, Abnormal findings on diagnostic imaging of                            other parts of digestive tract CPT copyright 2016 American Medical Association. All rights reserved. The codes documented in this report are preliminary and upon coder review may  be revised to meet current compliance requirements. Rachael Fee, MD 08/03/2016 10:40:33 AM This report has been signed electronically. Number of Addenda: 0

## 2016-08-03 NOTE — Transfer of Care (Signed)
Immediate Anesthesia Transfer of Care Note  Patient: Samuel Huffman  Procedure(s) Performed: Procedure(s): ESOPHAGOGASTRODUODENOSCOPY (EGD) WITH PROPOFOL (N/A) UPPER ENDOSCOPIC ULTRASOUND (EUS) LINEAR  Patient Location: Endoscopy Unit  Anesthesia Type:MAC  Level of Consciousness: awake, alert , oriented and patient cooperative  Airway & Oxygen Therapy: Patient Spontanous Breathing and Patient connected to nasal cannula oxygen  Post-op Assessment: Report given to RN and Post -op Vital signs reviewed and stable  Post vital signs: Reviewed and stable  Last Vitals:  Vitals:   08/03/16 0800 08/03/16 1029  BP: 110/69 93/63  Pulse: 85 93  Resp: 12 16  Temp:  36.8 C    Last Pain:  Vitals:   08/03/16 1029  TempSrc: Oral  PainSc:          Complications: No apparent anesthesia complications

## 2016-08-03 NOTE — Plan of Care (Signed)
Problem: Pain Managment: Goal: General experience of comfort will improve Outcome: Progressing Patient on PCA, pain under better control

## 2016-08-03 NOTE — Interval H&P Note (Signed)
History and Physical Interval Note:  08/03/2016 9:39 AM  Samuel Huffman  has presented today for surgery, with the diagnosis of nausea, vomiting, coffeeground vomiting, irregular appearance of stomach on CT scan.  pancreatitis, epigastric pain.  The various methods of treatment have been discussed with the patient and family. After consideration of risks, benefits and other options for treatment, the patient has consented to  Procedure(s): ESOPHAGOGASTRODUODENOSCOPY (EGD) WITH PROPOFOL (N/A) as a surgical intervention .  The patient's history has been reviewed, patient examined, no change in status, stable for surgery.  I have reviewed the patient's chart and labs.  Questions were answered to the patient's satisfaction.     Rachael FeeJacobs, Daniel P

## 2016-08-03 NOTE — Progress Notes (Signed)
Progress Note    Samuel Huffman  WUJ:[id number] DOB: [birth year]  DOA: 08/01/2016 PCP: No PCP Per Patient    Brief Narrative:   Chief complaint: Follow-up hematemesis  Samuel Huffman is an 51 y.o. male with medical history significant of Hypertension, anxiety, PTSD, alcoholic pancreatitis, alcohol abuse, tobacco abuse, who Was admitted 08/01/16 with coffee ground emesis concerning for alcoholic gastritis or variceal bleed.  Assessment/Plan:   Principal Problem: Upper GI bleed Secondary to gastropathy and esophagitis CT scan showed possible mild cirrhosis of the liver there with gastric varices; gastritis with possible intramural abscess or developing ulcer. An infiltrating neoplasm such as lymphoma is not excluded.GI consulted with plans to proceed with endoscopy in the morning. Continue IV Protonix; octreotide, and empiric Rocephin discontinued. No further hematemesis overnight. 3 g drop in hemoglobin over admission values, but relatively stable without any further reports of hematemesis. EGD performed today, findings showed portal gastropathy, gastritis and esophagitis.  Active problems: Acute recurrent pancreatitis Lipase 137. CT scan showed pseudocyst suspected interposed between the pancreatic gland and stomach as well as involving the pancreatic tail.  Calcified pancreatic gland consistent chronic pancreatitis. Patient states has not had any alcohol for nearly 2 years. Due to uncontrolled pain, the patient was placed on PCA Dilaudid. No evidence of hypertriglyceridemia. Continue nothing by mouth. Clear liquid diet tomorrow if pain improved.  Tobacco abuse and hx of Alcohol abuse Continue nicotine patch. Counseled on tobacco cessation. Abstinent from alcohol for almost 2 years.  Essential hypertension: Continue IV hydralazine when necessary.  Mild hypercalcemia Calcium 10.9, likely due to dehydration. Continue to hydrate.  Pulmonary nodule Follow-up with PCP for routine  surveillance.  Family Communication/Anticipated D/C date and plan/Code Status   DVT prophylaxis: SCDs ordered. Code Status: Full Code.  Family Communication: No family at bedside. Disposition Plan: Home when stable, likely another 24-48 hours.   Medical Consultants:    Gastroenterology.   Procedures:    None  Anti-Infectives:    Rocephin 08/02/16  Subjective:   Reports improved abdominal pain, no pain at rest, but pain aggravated by any movement. No nausea, vomiting, or recurrent hematemesis.  Objective:    Vitals:   08/03/16 0000 08/03/16 0400 08/03/16 0759 08/03/16 0800  BP: 109/71 112/75 110/69 110/69  Pulse: 89 99 86 85  Resp: 12 15 13 12   Temp: 98 F (36.7 C) 98.1 F (36.7 C) 98.9 F (37.2 C)   TempSrc: Oral Oral Oral   SpO2: 96% 96% 96% 97%  Weight:      Height:        Intake/Output Summary (Last 24 hours) at 08/03/16 0853 Last data filed at 08/03/16 0800  Gross per 24 hour  Intake             2830 ml  Output              800 ml  Net             2030 ml   Filed Weights   08/01/16 1904 08/02/16 0441  Weight: 86.2 kg (190 lb) 91.9 kg (202 lb 9.6 oz)    Exam: General exam: Appears calm and comfortable.  Respiratory system: Clear to auscultation. Respiratory effort normal. Cardiovascular system: S1 & S2 heard, RRR. No JVD,  rubs, gallops or clicks. No murmurs. Gastrointestinal system: Abdomen is nondistended, soft, tender in the midepigastric area. No organomegaly or masses felt. Normal bowel sounds heard. Central nervous system: Alert and oriented. No focal neurological deficits. Extremities:  No clubbing,  or cyanosis. No edema. Skin: No rashes, lesions or ulcers. Psychiatry: Judgement and insight appear normal. Mood & affect appropriate.   Data Reviewed:   I have personally reviewed following labs and imaging studies:  Labs: Basic Metabolic Panel:  Recent Labs Lab 08/01/16 1926 08/03/16 0359  NA 135 133*  K 3.6 3.8  CL 94* 96*  CO2  31 27  GLUCOSE 159* 101*  BUN 16 13  CREATININE 0.87 0.95  CALCIUM 10.9* 8.1*   GFR Estimated Creatinine Clearance: 108.2 mL/min (by C-G formula based on SCr of 0.95 mg/dL). Liver Function Tests:  Recent Labs Lab 08/01/16 1926  AST 22  ALT 14*  ALKPHOS 80  BILITOT 0.9  PROT 8.3*  ALBUMIN 4.1    Recent Labs Lab 08/01/16 1926  LIPASE 137*   No results for input(s): AMMONIA in the last 168 hours. Coagulation profile  Recent Labs Lab 08/02/16 1055  INR 1.09    CBC:  Recent Labs Lab 08/01/16 1926 08/02/16 0607 08/02/16 1055 08/02/16 1930 08/03/16 0359  WBC 12.7* 12.5* 14.8* 13.3* 11.8*  HGB 12.6* 10.8* 11.0* 9.9* 9.9*  HCT 38.4* 33.8* 33.9* 31.0* 31.5*  MCV 100.3* 100.3* 100.3* 100.3* 101.6*  PLT 363 261 247 255 263   Cardiac Enzymes: No results for input(s): CKTOTAL, CKMB, CKMBINDEX, TROPONINI in the last 168 hours. BNP (last 3 results) No results for input(s): PROBNP in the last 8760 hours. CBG:  Recent Labs Lab 08/02/16 0729 08/03/16 0757  GLUCAP 107* 96   D-Dimer: No results for input(s): DDIMER in the last 72 hours. Hgb A1c: No results for input(s): HGBA1C in the last 72 hours. Lipid Profile:  Recent Labs  08/02/16 0607  CHOL 152  HDL 47  LDLCALC 81  TRIG 122  CHOLHDL 3.2   Thyroid function studies: No results for input(s): TSH, T4TOTAL, T3FREE, THYROIDAB in the last 72 hours.  Invalid input(s): FREET3 Anemia work up: No results for input(s): VITAMINB12, FOLATE, FERRITIN, TIBC, IRON, RETICCTPCT in the last 72 hours. Sepsis Labs:  Recent Labs Lab 08/02/16 0607 08/02/16 1055 08/02/16 1930 08/03/16 0359  WBC 12.5* 14.8* 13.3* 11.8*    Microbiology Recent Results (from the past 240 hour(s))  MRSA PCR Screening     Status: None   Collection Time: 08/02/16  4:38 AM  Result Value Ref Range Status   MRSA by PCR NEGATIVE NEGATIVE Final    Comment:        The GeneXpert MRSA Assay (FDA approved for NASAL specimens only), is  one component of a comprehensive MRSA colonization surveillance program. It is not intended to diagnose MRSA infection nor to guide or monitor treatment for MRSA infections.     Radiology: Dg Chest 2 View  Result Date: 08/01/2016 CLINICAL DATA:  Hematemesis. Upper back pain for 2 days. History of pancreatitis and hypertension. EXAM: CHEST  2 VIEW COMPARISON:  None. FINDINGS: Shallow inspiration with slight linear atelectasis in the lung bases. 6 mm nodular opacity in the right apex. Normal heart size and pulmonary vascularity. No focal airspace disease or consolidation in the lungs. No blunting of costophrenic angles. No pneumothorax. Mediastinal contours appear intact. IMPRESSION: Shallow inspiration with linear atelectasis in the lung bases. 6 mm nodular opacity in the right apex. Consider CT for further evaluation a current smoker. Electronically Signed   By: Burman NievesWilliam  Stevens M.D.   On: 08/01/2016 23:44   Ct Abdomen Pelvis W Contrast  Result Date: 08/01/2016 CLINICAL DATA:  Epigastric pain x3 days EXAM: CT  ABDOMEN AND PELVIS WITH CONTRAST TECHNIQUE: Multidetector CT imaging of the abdomen and pelvis was performed using the standard protocol following bolus administration of intravenous contrast. CONTRAST:  ISOVUE-300 IOPAMIDOL (ISOVUE-300) INJECTION 61% COMPARISON:  CT from 10/01/2014 FINDINGS: Lower chest: Normal size cardiac chambers. No pericardial effusion. Small hiatal hernia. Atelectasis at each lung base and/or scarring. No pneumonic consolidation, effusion or pneumothorax. Hepatobiliary: Small left hepatic lobe. Subtle micronodular appearance of liver surface. No space-occupying mass or biliary dilatation. Gallbladder is physiologically distended without calculus. Pancreas: Calcified pancreatic gland consistent with chronic pancreatitis. Probable pseudocyst at the tail of the pancreas near the splenic hilum. This measures 1.6 cm in diameter. No ductal dilatation. Spleen: No  splenomegaly or focal mass. Adrenals/Urinary Tract: No adrenal mass. Tiny cyst in the upper pole the right kidney too small to further characterize measuring approximately 5 mm. No significant change from prior. Mild perinephric fat stranding bilaterally. Urinary bladder is physiologically distended without acute abnormality. Stomach/Bowel: Abnormally thickened appearance of the entirety of the stomach more focally along its posterior [place] where there is an area of hypodensity seen within measuring up to 2 cm. Small intramural abscess could have this appearance or developing ulcer. Necrotic intraluminal mass not excluded. Situated between the body of the pancreas and stomach is a 3.8 cm hypodensity which may represent a large pseudocyst from previous bouts of pancreatitis. Mild inflammatory thickening is seen along duodenum bulb and descending portion of the duodenum. No small bowel dilatation. No bowel obstruction or free air. Moderate colonic stool burden is noted. There is abrupt narrowing the sigmoid colon on series 2, image 86 possibly from diffuse spasm. Stenosis not entirely excluded. Vascular/Lymphatic: No aortic aneurysm or significant atherosclerosis. Epigastric lymph nodes measuring up to 9 mm short axis are identified in the left upper quadrant. 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 Normal Reproductive: Mildly enlarged prostate measuring up to 5.8 cm. Other: No free air nor free fluid. Musculoskeletal: No acute osseous abnormality. IMPRESSION: 1. Diffusely thickened stomach with more focally thickened area along the posterior body of the stomach with a small 2 cm focus of hypodensity seen within. Findings may be related to gastritis with possible intramural abscess or developing ulcer. An infiltrating neoplasm such as lymphoma is not excluded. Direct visual correlation is recommended. 2. 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 3. Pseudocyst suspected interposed  between the pancreatic gland and stomach as well as involving the pancreatic tail. Calcified pancreatic gland consistent chronic pancreatitis. 4. Abrupt caliber change involving the sigmoid colon possibly from diffuse sigmoid spasm. Stricture is not entirely excluded. Electronically Signed   By: Tollie Eth M.D.   On: 08/01/2016 23:51   US Abdomen Limited  Result Date: 08/02/2016 CLINICAL DATA:  Cirrhosis EXAM: US ABDOMEN LIMITED - RIGHT UPPER QUADRANT COMPARISON:  08/01/2016, 10/01/2014 FINDINGS: Gallbladder: Mild sludge present in the gallbladder. Normal Hedman thickness. Negative sonographic Murphy's. Common bile duct: Diameter: Normal at 5.1 mm Liver: Mildly nodular contour.  No focal hepatic abnormality. Trace amount of ascites. IMPRESSION: 1. Mildly nodular liver contour suspicious for cirrhosis. No focal hepatic abnormality. 2. Small amount of gallbladder sludge. 3. Trace amount of ascites Electronically Signed   By: Jasmine Pang M.D.   On: 08/02/2016 20:26   [place] Art/ven Flow Abd Pelv Doppler  Result Date: 08/03/2016 CLINICAL DATA:  Cirrhosis with ascites and gastric varices. Abdominal pain. History of pancreatitis. EXAM: DUPLEX ULTRASOUND OF LIVER TECHNIQUE: Color and duplex Doppler ultrasound was performed to evaluate  the hepatic in-flow and out-flow vessels. COMPARISON:  CT 08/01/2016. Right upper quadrant ultrasound 08/03/2015 FINDINGS: Portal Vein Velocities Main:  67 cm/sec Hepatic Vein Velocities Right:  49 cm/sec Middle:  66 cm/sec Left:  34 cm/sec Hepatic Artery Velocity:  162 cm/sec Splenic Vein Velocity:  46 cm/sec Varices: Not visualized Ascites: Present There is trace perihepatic ascites. Main portal vein measures 1.5 cm in diameter. Normal hepatofugal flow in the hepatic veins. Splenic vein at the splenic hilum is patent. The portal confluence is not visualized on this examination. Spectral tracings in the portal vein are irregular and this is probably related to the severe stenosis noted at  the portal confluence on the recent CT. Suspect that the splenic vein near the portal confluence is severely narrowed or occluded. However, the main portal vein is patent with turbulent flow. Overall, there is hepatopetal flow in main portal vein. Limited evaluation of the left and right hepatic portal veins. IVC is patent. IMPRESSION: Main portal vein is patent and there appears to be turbulent flow. Turbulent flow is likely associated with the severe narrowing of the portal confluence based on the recent CT. Trace perihepatic ascites. Hepatic veins are patent with normal direction of flow. Electronically Signed   By: Richarda Overlie M.D.   On: 08/03/2016 08:10    Medications:   . [MAR Hold] cefTRIAXone (ROCEPHIN)  IV  1 g Intravenous Q0600  . [MAR Hold] HYDROmorphone   Intravenous Q4H  . [MAR Hold] nicotine  21 mg Transdermal Daily  . [MAR Hold] pantoprazole  40 mg Intravenous Q12H  . [MAR Hold] sodium chloride flush  3 mL Intravenous Q12H   Continuous Infusions: . sodium chloride    . sodium chloride 150 mL/hr at 08/03/16 0800    Medical decision making is of high complexity and this patient is at high risk of deterioration, therefore this is a level 3 visit.  (> 4 problem points, >4 data points, high risk)   LOS: 1 day   RAMA,CHRISTINA  [facility] Pager [phone]. If unable to reach me by pager, please call my cell phone at [phone].  *Please refer to [email or web address], password [email or web address] to get updated schedule on who will round on this patient, as hospitalists switch teams weekly. If 7PM-7AM, please contact night-coverage at [email or web address], password [email or web address] for any overnight needs.  08/03/2016, 8:53 AM
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
## 2016-08-03 NOTE — Telephone Encounter (Signed)
-----   Message from Rachael Feeaniel P Jacobs, MD sent at 08/03/2016 10:43 AM EST ----- He needs rov with me in 2 months, should be going home this weekend.  thanks

## 2016-08-03 NOTE — Telephone Encounter (Signed)
Appt scheduled and will be available to pt at discharge from the hospital.

## 2016-08-04 ENCOUNTER — Encounter (HOSPITAL_COMMUNITY): Payer: Self-pay | Admitting: Internal Medicine

## 2016-08-04 DIAGNOSIS — E876 Hypokalemia: Secondary | ICD-10-CM | POA: Diagnosis present

## 2016-08-04 DIAGNOSIS — E871 Hypo-osmolality and hyponatremia: Secondary | ICD-10-CM | POA: Diagnosis present

## 2016-08-04 LAB — BASIC METABOLIC PANEL
ANION GAP: 14 (ref 5–15)
BUN: 8 mg/dL (ref 6–20)
CO2: 21 mmol/L — ABNORMAL LOW (ref 22–32)
Calcium: 7.7 mg/dL — ABNORMAL LOW (ref 8.9–10.3)
Chloride: 96 mmol/L — ABNORMAL LOW (ref 101–111)
Creatinine, Ser: 0.88 mg/dL (ref 0.61–1.24)
GFR calc Af Amer: >60 mL/min (ref >60)
GLUCOSE: 69 mg/dL (ref 65–99)
Potassium: 3.3 mmol/L — ABNORMAL LOW (ref 3.5–5.1)
SODIUM: 131 mmol/L — AB (ref 135–145)

## 2016-08-04 LAB — CBC
HCT: 28.2 % — ABNORMAL LOW (ref 39.0–52.0)
Hemoglobin: 9 g/dL — ABNORMAL LOW (ref 13.0–17.0)
MCH: 31.8 pg (ref 26.0–34.0)
MCHC: 31.9 g/dL (ref 30.0–36.0)
MCV: 99.6 fL (ref 78.0–100.0)
PLATELETS: 223 10*3/uL (ref 150–400)
RBC: 2.83 MIL/uL — ABNORMAL LOW (ref 4.22–5.81)
RDW: 12.5 % (ref 11.5–15.5)
WBC: 9.8 10*3/uL (ref 4.0–10.5)

## 2016-08-04 LAB — HEPATITIS B SURFACE ANTIBODY,QUALITATIVE: HEP B S AB: REACTIVE

## 2016-08-04 LAB — HEPATITIS B SURFACE ANTIGEN: HEP B S AG: NEGATIVE

## 2016-08-04 LAB — HEPATITIS C ANTIBODY: HCV Ab: <0.1 s/co ratio (ref 0.0–0.9)

## 2016-08-04 LAB — GLUCOSE, CAPILLARY: GLUCOSE-CAPILLARY: 68 mg/dL (ref 65–99)

## 2016-08-04 MED ORDER — PANTOPRAZOLE SODIUM 40 MG PO TBEC: 40.0000 mg | DELAYED_RELEASE_TABLET | Freq: Every day | ORAL | 3 refills | Status: DC

## 2016-08-04 MED ORDER — OXYCODONE HCL 5 MG PO TABS: 5.0000 mg | ORAL_TABLET | ORAL | Status: DC | PRN

## 2016-08-04 MED ORDER — ACETAMINOPHEN 325 MG PO TABS: 650.0000 mg | ORAL_TABLET | ORAL | Status: DC | PRN

## 2016-08-04 MED ORDER — PANTOPRAZOLE SODIUM 40 MG PO TBEC
40.0000 mg | DELAYED_RELEASE_TABLET | Freq: Every day | ORAL | Status: DC
  Administered 2016-08-04: 40 mg via ORAL
  Filled 2016-08-04: qty 1

## 2016-08-04 NOTE — Progress Notes (Signed)
Pt was provided separate return to work note, discharge instructions. Patient was educated regarding his reason for admission with teach back and tobacco cessation education. Patient left with belongings: clothes and cell phone. Pt was accompanied by his son at discharge.

## 2016-08-04 NOTE — Discharge Summary (Signed)
Physician Discharge Summary  Samuel Huffman ZOX:[id number] DOB: 1966-04-23 DOA: 08/01/2016  PCP: No PCP Per Patient  Admit date: 08/01/2016 Discharge date: 08/04/2016  Admitted From: Home Discharge disposition: Home   Recommendations for Outpatient Follow-Up:   1. The patient will follow-up with Dr. Christella Hartigan of GI. 2. Patient advised to obtain a PCP. Will need follow-up of lung nodule with CT scan as an outpatient.   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  Diet recommendation: Low sodium, heart healthy.    History of Present Illness:   Samuel Huffman is an 51 y.o. male with medical history significant of Hypertension, anxiety, PTSD, alcoholic pancreatitis, alcoholabuse, tobacco abuse, who was admitted 08/01/16 with coffee ground emesis concerning for alcoholic gastritis or variceal bleed.   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 An infiltrating neoplasm such as lymphoma is not excluded.Status post EGD which showed portal gastropathy, gastric varices, and reflux esophagitis. The patient will follow-up with Dr. Christella Hartigan. He will be discharged on PPI therapy per recommendations.  Active problems: Acute recurrent pancreatitis Lipase 137. CT scan showed pseudocyst suspected interposed between  the pancreatic gland and stomach as well as involving the pancreatic tail. Calcified pancreatic gland consistent chronic pancreatitis. Patient states has not had any alcohol for nearly 2 years. Due to uncontrolled pain, the patient was placed on PCA Dilaudid. Pain now controlled and his diet will be slowly advanced.  Tobacco abuse and hx of Alcohol abuse Continue nicotine patch. Counseled on tobacco cessation. Abstinent from alcohol for almost 2 years.  Essential hypertension: Treated with IV hydralazine when necessary.  Mild hypercalcemia Calcium 10.9, likely due to dehydration. Resolved with IV fluids.  Pulmonary nodule Follow-up with PCP for consideration of a nonemergent CT for further characterization. Patient verbally told of these findings and need for follow-up.  Hyponatremia Likely from cirrhosis physiology.  Hypokalemia Likely due to nothing by mouth status. Should normalize with resumption of diet.  Medical Consultants:    Gastroenterology: Dr. Christella Hartigan   Discharge Exam:   Vitals:   08/04/16 0830 08/04/16 0900  BP: 112/71   Pulse: 77   Resp: 11 16  Temp: 98.8 F (37.1 C)    Vitals:   08/04/16 0400 08/04/16 0800 08/04/16 0830 08/04/16 0900  BP:   112/71   Pulse:   77   Resp: 12 18 11 16   Temp:   98.8 F (37.1 C)   TempSrc:   Oral   SpO2: 96% 99% 97% 96%  Weight:      Height:       General exam: Appears calm and comfortable.  Respiratory system: Clear to auscultation. Respiratory effort normal. Cardiovascular system: S1 & S2 heard, RRR. No JVD,  rubs, gallops or clicks. No murmurs. Gastrointestinal system:  Abdomen is nondistended, soft, tender in the midepigastric area. No organomegaly or masses felt. Normal bowel sounds heard. Central nervous system: Alert and oriented. No focal neurological deficits. Extremities: No clubbing,  or cyanosis. No edema. Skin: No rashes, lesions or ulcers. Psychiatry: Judgement and insight appear normal. Mood & affect  appropriate.    The results of significant diagnostics from this hospitalization (including imaging, microbiology, ancillary and laboratory) are listed below for reference.     Procedures and Diagnostic Studies:   Dg Chest 2 View  Result Date: 08/01/2016 CLINICAL DATA:  Hematemesis. Upper back pain for 2 days. History of pancreatitis and hypertension. EXAM: CHEST  2 VIEW COMPARISON:  None. FINDINGS: Shallow inspiration with slight linear atelectasis in the lung bases. 6 mm nodular opacity in the right apex. Normal heart size and pulmonary vascularity. No focal airspace disease or consolidation in the lungs. No blunting of costophrenic angles. No pneumothorax. Mediastinal contours appear intact. IMPRESSION: Shallow inspiration with linear atelectasis in the lung bases. 6 mm nodular opacity in the right apex. Consider CT for further evaluation a current smoker. Electronically Signed   By: Burman Nieves M.D.   On: 08/01/2016 23:44   Ct Abdomen Pelvis W Contrast  Result Date: 08/01/2016 CLINICAL DATA:  Epigastric pain x3 days EXAM: CT ABDOMEN AND PELVIS WITH CONTRAST TECHNIQUE: Multidetector CT imaging of the abdomen and pelvis was performed using the standard protocol following bolus administration of intravenous contrast. CONTRAST:  ISOVUE-300 IOPAMIDOL (ISOVUE-300) INJECTION 61% COMPARISON:  CT from 10/01/2014 FINDINGS: Lower chest: Normal size cardiac chambers. No pericardial effusion. Small hiatal hernia. Atelectasis at each lung base and/or scarring. No pneumonic consolidation, effusion or pneumothorax. Hepatobiliary: Small left hepatic lobe. Subtle micronodular appearance of liver surface. No space-occupying mass or biliary dilatation. Gallbladder is physiologically distended without calculus. Pancreas: Calcified pancreatic gland consistent with chronic pancreatitis. Probable pseudocyst at the tail of the pancreas near the splenic hilum. This measures 1.6 cm in diameter. No ductal dilatation.  Spleen: No splenomegaly or focal mass. Adrenals/Urinary Tract: No adrenal mass. Tiny cyst in the upper pole the right kidney too small to further characterize measuring approximately 5 mm. No significant change from prior. Mild perinephric fat stranding bilaterally. Urinary bladder is physiologically distended without acute abnormality. Stomach/Bowel: Abnormally thickened appearance of the entirety of the stomach more focally along its posterior [place] where there is an area of hypodensity seen within measuring up to 2 cm. Small intramural abscess could have this appearance or developing ulcer. Necrotic intraluminal mass not excluded. Situated between the body of the pancreas and stomach is a 3.8 cm hypodensity which may represent a large pseudocyst from previous bouts of pancreatitis. Mild inflammatory thickening is seen along duodenum bulb and descending portion of the duodenum. No small bowel dilatation. No bowel obstruction or free air. Moderate colonic stool burden is noted. There is abrupt narrowing the sigmoid colon on series 2, image 86 possibly from diffuse spasm. Stenosis not entirely excluded. Vascular/Lymphatic: No aortic aneurysm or significant atherosclerosis. Epigastric lymph nodes measuring up to 9 mm short axis are identified in the left upper quadrant. 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 Normal Reproductive: Mildly enlarged prostate measuring up to 5.8 cm. Other: No free air nor free fluid. Musculoskeletal: No acute osseous abnormality. IMPRESSION: 1. Diffusely thickened stomach with more focally thickened area along the posterior body of the stomach with a small 2 cm focus of hypodensity seen within. Findings may be related to gastritis with possible intramural abscess or developing  ulcer. An infiltrating neoplasm such as lymphoma is not excluded. Direct visual correlation is recommended. 2. 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 3. Pseudocyst suspected  interposed between the pancreatic gland and stomach as well as involving the pancreatic tail. Calcified pancreatic gland consistent chronic pancreatitis. 4. Abrupt caliber change involving the sigmoid colon possibly from diffuse sigmoid spasm. Stricture is not entirely excluded. Electronically Signed   By: Tollie Eth M.D.   On: 08/01/2016 23:51   US Abdomen Limited  Result Date: 08/02/2016 CLINICAL DATA:  Cirrhosis EXAM: US ABDOMEN LIMITED - RIGHT UPPER QUADRANT COMPARISON:  08/01/2016, 10/01/2014 FINDINGS: Gallbladder: Mild sludge present in the gallbladder. Normal Netzer thickness. Negative sonographic Murphy's. Common bile duct: Diameter: Normal at 5.1 mm Liver: Mildly nodular contour.  No focal hepatic abnormality. Trace amount of ascites. IMPRESSION: 1. Mildly nodular liver contour suspicious for cirrhosis. No focal hepatic abnormality. 2. Small amount of gallbladder sludge. 3. Trace amount of ascites Electronically Signed   By: Jasmine Pang M.D.   On: 08/02/2016 20:26   [place] Art/ven Flow Abd Pelv Doppler  Result Date: 08/03/2016 CLINICAL DATA:  Cirrhosis with ascites and gastric varices. Abdominal pain. History of pancreatitis. EXAM: DUPLEX ULTRASOUND OF LIVER TECHNIQUE: Color and duplex Doppler ultrasound was performed to evaluate the hepatic in-flow and out-flow vessels. COMPARISON:  CT 08/01/2016. Right upper quadrant ultrasound 08/03/2015 FINDINGS: Portal Vein Velocities Main:  67 cm/sec Hepatic Vein Velocities Right:  49 cm/sec Middle:  66 cm/sec Left:  34 cm/sec Hepatic Artery Velocity:  162 cm/sec Splenic Vein Velocity:  46 cm/sec Varices: Not visualized Ascites: Present There is trace perihepatic ascites. Main portal vein measures 1.5 cm in diameter. Normal hepatofugal flow in the hepatic veins. Splenic vein at the splenic hilum is patent. The portal confluence is not visualized on this examination. Spectral tracings in the portal vein are irregular and this is probably related to the severe  stenosis noted at the portal confluence on the recent CT. Suspect that the splenic vein near the portal confluence is severely narrowed or occluded. However, the main portal vein is patent with turbulent flow. Overall, there is hepatopetal flow in main portal vein. Limited evaluation of the left and right hepatic portal veins. IVC is patent. IMPRESSION: Main portal vein is patent and there appears to be turbulent flow. Turbulent flow is likely associated with the severe narrowing of the portal confluence based on the recent CT. Trace perihepatic ascites. Hepatic veins are patent with normal direction of flow. Electronically Signed   By: Richarda Overlie M.D.   On: 08/03/2016 08:10     Labs:   Basic Metabolic Panel:  Recent Labs Lab 08/01/16 1926 08/03/16 0359 08/04/16 0210  NA 135 133* 131*  K 3.6 3.8 3.3*  CL 94* 96* 96*  CO2 31 27 21*  GLUCOSE 159* 101* 69  BUN 16 13 8   CREATININE 0.87 0.95 0.88  CALCIUM 10.9* 8.1* 7.7*   GFR Estimated Creatinine Clearance: 116.8 mL/min (by C-G formula based on SCr of 0.88 mg/dL). Liver Function Tests:  Recent Labs Lab 08/01/16 1926  AST 22  ALT 14*  ALKPHOS 80  BILITOT 0.9  PROT 8.3*  ALBUMIN 4.1    Recent Labs Lab 08/01/16 1926  LIPASE 137*   No results for input(s): AMMONIA in the last 168 hours. Coagulation profile  Recent Labs Lab 08/02/16 1055  INR 1.09    CBC:  Recent Labs Lab 08/02/16 0607 08/02/16 1055 08/02/16 1930 08/03/16 0359 08/04/16 0210  WBC 12.5* 14.8* 13.3* 11.8* 9.8  HGB 10.8* 11.0* 9.9* 9.9* 9.0*  HCT 33.8* 33.9* 31.0* 31.5* 28.2*  MCV 100.3* 100.3* 100.3* 101.6* 99.6  PLT 261 247 255 263 223   Cardiac Enzymes: No results for input(s): CKTOTAL, CKMB, CKMBINDEX, TROPONINI in the last 168 hours. BNP: Invalid input(s): POCBNP CBG:  Recent Labs Lab 08/02/16 0729 08/03/16 0757 08/04/16 0827  GLUCAP 107* 96 68   D-Dimer No results for input(s): DDIMER in the last 72 hours. Hgb A1c No results  for input(s): HGBA1C in the last 72 hours. Lipid Profile  Recent Labs  08/02/16 0607  CHOL 152  HDL 47  LDLCALC 81  TRIG 122  CHOLHDL 3.2   Thyroid function studies No results for input(s): TSH, T4TOTAL, T3FREE, THYROIDAB in the last 72 hours.  Invalid input(s): FREET3 Anemia work up No results for input(s): VITAMINB12, FOLATE, FERRITIN, TIBC, IRON, RETICCTPCT in the last 72 hours. Microbiology Recent Results (from the past 240 hour(s))  MRSA PCR Screening     Status: None   Collection Time: 08/02/16  4:38 AM  Result Value Ref Range Status   MRSA by PCR NEGATIVE NEGATIVE Final    Comment:        The GeneXpert MRSA Assay (FDA approved for NASAL specimens only), is one component of a comprehensive MRSA colonization surveillance program. It is not intended to diagnose MRSA infection nor to guide or monitor treatment for MRSA infections.      Discharge Instructions:   Discharge Instructions    Call MD for:  extreme fatigue    Complete by:  As directed    Call MD for:  persistant nausea and vomiting    Complete by:  As directed    Call MD for:  severe uncontrolled pain    Complete by:  As directed    Call MD for:  temperature >100.4    Complete by:  As directed    Diet - low sodium heart healthy    Complete by:  As directed    Start with liquids and slowly advance your diet as tolerated.  Avoid fatty and greasy foods until you have tolerated bland foods.  If the abdominal pain returns, go back to liquids only.   Discharge instructions    Complete by:  As directed    Avoid any OTC pain relievers other than Tylenol. DO NOT use more than 2,000 mg of Tylenol in a 24 hour period.   Increase activity slowly    Complete by:  As directed      Allergies as of 08/04/2016   No Known Allergies     Medication List    STOP taking these medications   ibuprofen 200 MG tablet Commonly known as:  ADVIL,MOTRIN     TAKE these medications   acetaminophen 325 MG  tablet Commonly known as:  TYLENOL Take 2 tablets (650 mg total) by mouth every 4 (four) hours as needed for fever, headache or mild pain.   pantoprazole 40 MG tablet Commonly known as:  PROTONIX Take 1 tablet (40 mg total) by mouth daily at 6 (six) AM. Start taking on:  08/05/2016      Follow-up Information    Rachael FeeJacobs, Daniel P, MD Follow up on 10/15/2016.   Specialty:  Gastroenterology Contact information: [address] [place] [address] [phone]            Time coordinating discharge: 35 minutes.  Signed:  RAMA,CHRISTINA  Pager [phone] [facility] 08/04/2016, 3:58 PM

## 2016-08-04 NOTE — Discharge Instructions (Signed)
Acute Pancreatitis ° °Acute pancreatitis is a condition in which the pancreas suddenly becomes irritated and swollen (has inflammation). The pancreas is a gland that is located behind the stomach. It produces enzymes that help to digest food. The pancreas also releases the hormones glucagon and insulin, which help to regulate blood sugar. Damage to the pancreas occurs when the digestive enzymes from the pancreas are activated before they are released into the intestine. °Most acute attacks last a couple of days and can cause serious problems. Some people become dehydrated and develop low blood pressure. In severe cases, bleeding into the pancreas can lead to shock and can be life-threatening. The lungs, heart, and kidneys may fail. °What are the causes? °The most common causes of this condition are: °· Alcohol abuse. °· Gallstones. °Other causes include: °· Certain medicines. °· Exposure to certain chemicals. °· Infection. °· Damage caused by an accident (trauma). °· Abdominal surgery. °In some cases, the cause may not be known. °What are the signs or symptoms? °Symptoms of this condition include: °· Pain in the upper abdomen that may radiate to the back. °· Tenderness and swelling of the abdomen. °· Nausea and vomiting. °How is this diagnosed? °This condition may be diagnosed based on: °· A physical exam. °· Blood tests. °· Imaging tests, such as X-rays, CT scans, or an ultrasound of the abdomen. °How is this treated? °Treatment for this condition usually requires a stay in the hospital. Treatment may include: °· Pain medicine. °· Fluid replacement through an IV tube. °· Placing a tube in the stomach to remove stomach contents and to control vomiting (NG tube, or nasogastric tube). °· Not eating for 3-4 days. This gives the pancreas a rest, because enzymes are not being produced that can cause further damage. °· Antibiotic medicines, if your condition is caused by an infection. °· Surgery on the pancreas or  gallbladder. °Follow these instructions at home: °Eating and drinking  °· Follow instructions from your health care provider about diet. This may involve avoiding alcohol and decreasing the amount of fat in your diet. °· Eat smaller, more frequent meals. This reduces the amount of digestive fluids that the pancreas produces. °· Drink enough fluid to keep your urine clear or pale yellow. °· Do not drink alcohol if it caused your condition. °General instructions  °· Take over-the-counter and prescription medicines only as told by your health care provider. °· Do not use any tobacco products, such as cigarettes, chewing tobacco, and e-cigarettes. If you need help quitting, ask your health care provider. °· Get plenty of rest. °· If directed, check your blood sugar at home as told by your health care provider. °· Keep all follow-up visits as told by your health care provider. This is important. °Contact a health care provider if: °· You do not recover as quickly as expected. °· You develop new or worsening symptoms. °· You have persistent pain, weakness, or nausea. °· You recover and then have another episode of pain. °· You have a fever. °Get help right away if: °· You cannot eat or keep fluids down. °· Your pain becomes severe. °· Your skin or the white part of your eyes turns yellow (jaundice). °· You vomit. °· You feel dizzy or you faint. °· Your blood sugar is high (over 300 mg/dL). °This information is not intended to replace advice given to you by your health care provider. Make sure you discuss any questions you have with your health care provider. °Document Released: 06/11/2005 Document   Revised: 10/19/2015 Document Reviewed: 03/15/2015 °Elsevier Interactive Patient Education © 2017 [employer] ° °

## 2016-08-04 NOTE — Progress Notes (Addendum)
Dilaudid PCA discontinued at patient discharge. 5mL remaining wasted in sink. Nadine witnessed waste.

## 2016-08-04 NOTE — Progress Notes (Signed)
[facility] Progress Note    Since last GI note: EGD/EUS yesterday; see results in EPIC. Feeling much better overall. Passing gas, he is hungry and wants to eat.  No signficant abd pains.  Objective: Vital signs in last 24 hours: Temp:  [98 F (36.7 C)-99.2 F (37.3 C)] 98.1 F (36.7 C) (02/10 0332) Pulse Rate:  [80-102] 86 (02/10 0332) Resp:  [10-26] 12 (02/10 0400) BP: (93-136)/(59-87) 121/79 (02/10 0332) SpO2:  [95 %-98 %] 96 % (02/10 0400) FiO2 (%):  [32 %] 32 % (02/09 1059) Last BM Date: 07/29/16 General: alert and oriented times 3 Heart: regular rate and rythm Abdomen: soft, non-tender, non-distended, normal bowel sounds   Lab Results:  Recent Labs  08/02/16 1930 08/03/16 0359 08/04/16 0210  WBC 13.3* 11.8* 9.8  HGB 9.9* 9.9* 9.0*  PLT 255 263 223  MCV 100.3* 101.6* 99.6    Recent Labs  08/01/16 1926 08/03/16 0359 08/04/16 0210  NA 135 133* 131*  K 3.6 3.8 3.3*  CL 94* 96* 96*  CO2 31 27 21*  GLUCOSE 159* 101* 69  BUN 16 13 8   CREATININE 0.87 0.95 0.88  CALCIUM 10.9* 8.1* 7.7*    Recent Labs  08/01/16 1926  PROT 8.3*  ALBUMIN 4.1  AST 22  ALT 14*  ALKPHOS 80  BILITOT 0.9    Recent Labs  08/02/16 1055  INR 1.09     Medications: Scheduled Meds: . cefTRIAXone (ROCEPHIN)  IV  1 g Intravenous Q0600  . HYDROmorphone   Intravenous Q4H  . nicotine  21 mg Transdermal Daily  . pantoprazole  40 mg Intravenous Q0600  . sodium chloride flush  3 mL Intravenous Q12H   Continuous Infusions: . sodium chloride 150 mL/hr at 08/04/16 0638   PRN Meds:.diphenhydrAMINE **OR** diphenhydrAMINE, hydrALAZINE, naloxone **AND** sodium chloride flush, ondansetron (ZOFRAN) IV, zolpidem    Assessment/Plan: 51 y.o. male with acute on chronic pancreatitis; exacerbation possibly from flu-like illness a few days prior to admission  AP improving. The 'concerning mass' in stomach that was described on admitting CT is almost certainly an intramural  pseudocyst; was present and was larger on [facility] CT scan 3-4 years ago.    He has appt in my office April 23rd.  Will discuss his progress, consider starting nadolol at that time to decrease the risk of bleeding from gastric varices.  He should be on PPI once daily, indefinitely for his erosive esophagitis.  Will advance diet as tolerated today, likely safe for discharge later today.  Rachael FeeJacobs, Daniel P, MD  08/04/2016, 7:54 AM [facility] Pager [phone]

## 2016-08-04 NOTE — Progress Notes (Signed)
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

## 2016-08-06 ENCOUNTER — Encounter (HOSPITAL_COMMUNITY): Payer: Self-pay | Admitting: Gastroenterology

## 2016-10-15 ENCOUNTER — Ambulatory Visit (INDEPENDENT_AMBULATORY_CARE_PROVIDER_SITE_OTHER): Payer: BLUE CROSS/BLUE SHIELD | Admitting: Gastroenterology

## 2016-10-15 ENCOUNTER — Encounter: Payer: Self-pay | Admitting: Gastroenterology

## 2016-10-15 VITALS — BP 138/100 | HR 112 | Ht 73.0 in | Wt 209.0 lb

## 2016-10-15 DIAGNOSIS — Z1211 Encounter for screening for malignant neoplasm of colon: Secondary | ICD-10-CM

## 2016-10-15 DIAGNOSIS — K860 Alcohol-induced chronic pancreatitis: Secondary | ICD-10-CM

## 2016-10-15 MED ORDER — NA SULFATE-K SULFATE-MG SULF 17.5-3.13-1.6 GM/177ML PO SOLN: 1.0000 | Freq: Once | ORAL | 0 refills | Status: AC

## 2016-10-15 MED ORDER — PANTOPRAZOLE SODIUM 40 MG PO TBEC: 40.0000 mg | DELAYED_RELEASE_TABLET | Freq: Every day | ORAL | 11 refills | Status: DC

## 2016-10-15 NOTE — Patient Instructions (Addendum)
You will be set up for a colonoscopy for colon cancer screening ([facility]). Protonix , one pill once dialy, disp 30, with 11 refills.

## 2016-10-15 NOTE — Progress Notes (Signed)
Review of pertinent gastrointestinal problems: 1. 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  Acute pancreatitis episodes dating at least back to 2015, [facility] CT scan shows fluid collection within Trinkle of stomach 6cm that is almost certainly psuedocyst.  Admit [facility] 2018 the gastric Kloster fluid collection was 2.5cm.    +gastric varices (CT and EGD), doubtful underlying cirrhosis (normal Plts, INR, LFTs as of 2018 labs)  EGD 07/2016 Dr. Christella Hartigan: 2.5cm intrawall cyst (see above), also gastric varices and mild portal gastropathy changes.  CT 07/2016: pseudocyst panc tail, gastric Klutz, also abrupt narrowing sigmoid (spasm?)  HPI: This is a very pleasant 51 year old man whom I last saw he was hospitalized for acute on chronic pancreatitis  Chief complaint is chronic pancreatitis, routine risk for colon cancer  Has felt great since d/c 2 months ago.  Really like the pantoprazole  in AM,  The burning in his stomach is gone.  Stable weight, actually up about 5 pounds.  Not drinking much at all anymore.  He drinks an occasional nonalcoholic beer  He has no bowel symptoms, no bleeding, no constipation or diarrhea.    ROS: complete GI ROS as described in HPI.  Constitutional:  No unintentional weight loss   Past Medical History:  Diagnosis Date  . Acute upper GI bleeding 08/02/2016  . Anxiety   . Cirrhosis of liver with ascites (HCC)   . Hiatal hernia   . History of alcohol abuse   . Hypertension   . Insomnia   . Pancreatic pseudocyst   . Pancreatitis   . Portal hypertensive gastropathy (HCC)   . PTSD (post-traumatic stress disorder)   . Pulmonary nodule   . Varices, gastric     Past Surgical History:  Procedure Laterality Date  . ESOPHAGOGASTRODUODENOSCOPY (EGD) WITH PROPOFOL N/A 08/03/2016   Procedure: ESOPHAGOGASTRODUODENOSCOPY (EGD) WITH PROPOFOL;  Surgeon: Rachael Fee, MD;  Location: [facility] ENDOSCOPY;  Service: Endoscopy;  Laterality: N/A;  . EUS  08/03/2016   Procedure: UPPER ENDOSCOPIC ULTRASOUND (EUS) LINEAR;  Surgeon: Rachael Fee, MD;  Location: [facility] ENDOSCOPY;  Service: Endoscopy;;  . FEMUR SURGERY      Current Outpatient Prescriptions  Medication Sig Dispense Refill  . acetaminophen (TYLENOL) 325 MG tablet Take 2 tablets (650 mg total) by mouth every 4 (four) hours as needed for fever, headache or mild pain. (Patient taking differently: Take 650 mg by mouth as needed for fever, headache or mild pain. )    . pantoprazole (PROTONIX) 40 MG tablet Take 1 tablet (40 mg total) by mouth daily at 6 (six) AM. 30 tablet 3   No current facility-administered medications for this visit.     Allergies as of 10/15/2016  . (No Known Allergies)    Family History  Problem Relation Age of Onset  . Adopted: Yes  . Healthy Son     x1    Social History   Social History  . Marital status: Divorced    Spouse name: N/A  . Number of children: 1  . Years of education: N/A   Occupational History  . [employer] doctor    Social History Main Topics  . Smoking status: Current Every Day Smoker    Packs/day: 0.50    Types: Cigarettes  . Smokeless tobacco: Current User    Types: Snuff  . Alcohol use No  . Drug use: No  . Sexual activity: Not on file   Other Topics Concern  . Not on file   Social History Narrative  . No  narrative on file     Physical Exam: BP (!) 138/100 (BP Location: Left Arm, Patient Position: Sitting, Cuff Size: Normal)   Pulse (!) 112   Ht  (1.854 m) Comment: height measured without shoes  Wt 209 lb (94.8 kg)   BMI 27.57 kg/m  Constitutional: generally well-appearing Psychiatric: alert and oriented x3 Abdomen: soft, nontender, nondistended, no obvious ascites, no peritoneal signs, normal bowel sounds No peripheral edema noted in lower extremities  Assessment and plan: 51 y.o. male with Chronic calcific pancreatitis, routine risk for colon cancer  He has never had colon cancer screening I recommended we do that  for him now. I do think that the sigmoid narrowing noted on CT scan 2 months ago is likely peristalsis, spasm related. He has 0 bowel symptoms to suggest underlying significant pathology or stricture. The burning in his stomach is much improved with proton X one pill once daily. Ana refill that for a year. He has no symptoms of chronic pancreatitis currently. He knows to avoid alcohol.  Please see the "Patient Instructions" section for addition details about the plan.  Rob Bunting, MD [facility] 10/15/2016, 8:27 AM

## 2016-11-15 ENCOUNTER — Encounter: Payer: Self-pay | Admitting: Gastroenterology

## 2016-11-23 ENCOUNTER — Encounter: Payer: Self-pay | Admitting: Gastroenterology

## 2016-11-23 ENCOUNTER — Ambulatory Visit (AMBULATORY_SURGERY_CENTER): Payer: BLUE CROSS/BLUE SHIELD | Admitting: Gastroenterology

## 2016-11-23 VITALS — BP 130/82 | HR 78 | Resp 16 | Ht 73.0 in | Wt 209.0 lb

## 2016-11-23 DIAGNOSIS — Z1211 Encounter for screening for malignant neoplasm of colon: Secondary | ICD-10-CM

## 2016-11-23 DIAGNOSIS — K649 Unspecified hemorrhoids: Secondary | ICD-10-CM

## 2016-11-23 DIAGNOSIS — Z1212 Encounter for screening for malignant neoplasm of rectum: Secondary | ICD-10-CM | POA: Diagnosis not present

## 2016-11-23 MED ORDER — SODIUM CHLORIDE 0.9 % IV SOLN: 500.0000 mL | INTRAVENOUS | Status: DC

## 2016-11-23 NOTE — Patient Instructions (Signed)
YOU HAD AN ENDOSCOPIC PROCEDURE TODAY AT THE [facility]:   Refer to the procedure report that was given to you for any specific questions about what was found during the examination.  If the procedure report does not answer your questions, please call your gastroenterologist to clarify.  If you requested that your care partner not be given the details of your procedure findings, then the procedure report has been included in a sealed envelope for you to review at your convenience later.  YOU SHOULD EXPECT: Some feelings of bloating in the abdomen. Passage of more gas than usual.  Walking can help get rid of the air that was put into your GI tract during the procedure and reduce the bloating. If you had a lower endoscopy (such as a colonoscopy or flexible sigmoidoscopy) you may notice spotting of blood in your stool or on the toilet paper. If you underwent a bowel prep for your procedure, you may not have a normal bowel movement for a few days.  Please Note:  You might notice some irritation and congestion in your nose or some drainage.  This is from the oxygen used during your procedure.  There is no need for concern and it should clear up in a day or so.  SYMPTOMS TO REPORT IMMEDIATELY:   Following lower endoscopy (colonoscopy or flexible sigmoidoscopy):  Excessive amounts of blood in the stool  Significant tenderness or worsening of abdominal pains  Swelling of the abdomen that is new, acute  Fever of 100F or higher   For urgent or emergent issues, a gastroenterologist can be reached at any hour by calling [phone].   DIET:  We do recommend a small meal at first, but then you may proceed to your regular diet.  Drink plenty of fluids but you should avoid alcoholic beverages for 24 hours.  ACTIVITY:  You should plan to take it easy for the rest of today and you should NOT DRIVE or use heavy machinery until tomorrow (because of the sedation medicines used during the test).     FOLLOW UP: Our staff will call the number listed on your records the next business day following your procedure to check on you and address any questions or concerns that you may have regarding the information given to you following your procedure. If we do not reach you, we will leave a message.  However, if you are feeling well and you are not experiencing any problems, there is no need to return our call.  We will assume that you have returned to your regular daily activities without incident.  If any biopsies were taken you will be contacted by phone or by letter within the next 1-3 weeks.  Please call us at [phone] if you have not heard about the biopsies in 3 weeks.    SIGNATURES/CONFIDENTIALITY: You and/or your care partner have signed paperwork which will be entered into your electronic medical record.  These signatures attest to the fact that that the information above on your After Visit Summary has been reviewed and is understood.  Full responsibility of the confidentiality of this discharge information lies with you and/or your care-partner.  Thank you for letting us take care of your healthcare needs today. 

## 2016-11-23 NOTE — Progress Notes (Signed)
Report to PACU, RN, vss, BBS= Clear.  

## 2016-11-23 NOTE — Op Note (Signed)
[facility] Patient Name: Samuel Huffman Procedure Date: 11/23/2016 2:42 PM MRN: [id number] Endoscopist: Rachael Fee , MD Age: 51 Referring MD:  Date of Birth: 1966/05/20 Gender: Male Account #: [id number] Procedure:                Colonoscopy Indications:              Screening for colorectal malignant neoplasm Medicines:                Monitored Anesthesia Care Procedure:                Pre-Anesthesia Assessment:                           - Prior to the procedure, a History and Physical                            was performed, and patient medications and                            allergies were reviewed. The patient's tolerance of                            previous anesthesia was also reviewed. The risks                            and benefits of the procedure and the sedation                            options and risks were discussed with the patient.                            All questions were answered, and informed consent                            was obtained. Prior Anticoagulants: The patient has                            taken no previous anticoagulant or antiplatelet                            agents. ASA Grade Assessment: II - A patient with                            mild systemic disease. After reviewing the risks                            and benefits, the patient was deemed in                            satisfactory condition to undergo the procedure.                           After obtaining informed consent, the colonoscope  was passed under direct vision. Throughout the                            procedure, the patient's blood pressure, pulse, and                            oxygen saturations were monitored continuously. The                            Colonoscope was introduced through the anus and                            advanced to the the cecum, identified by                            appendiceal orifice and  ileocecal valve. The                            colonoscopy was performed without difficulty. The                            patient tolerated the procedure well. The quality                            of the bowel preparation was good. The ileocecal                            valve, appendiceal orifice, and rectum were                            photographed. Scope In: 2:49:10 PM Scope Out: 2:59:49 PM Scope Withdrawal Time: 0 hours 6 minutes 42 seconds  Total Procedure Duration: 0 hours 10 minutes 39 seconds  Findings:                 Internal hemorrhoids were found. The hemorrhoids                            were small.                           The exam was otherwise without abnormality on                            direct and retroflexion views. Complications:            No immediate complications. Estimated blood loss:                            None. Estimated Blood Loss:     Estimated blood loss: none. Impression:               - Small internal hemorrhoids.                           - The examination was otherwise normal on direct  and retroflexion views.                           - No polyps or cancers Recommendation:           - Patient has a contact number available for                            emergencies. The signs and symptoms of potential                            delayed complications were discussed with the                            patient. Return to normal activities tomorrow.                            Written discharge instructions were provided to the                            patient.                           - Resume previous diet.                           - Continue present medications.                           - Repeat colonoscopy in 10 years for screening                            purposes.                           - Dr. Christella Hartigan' office will arrange return visit in 6                            months in office. Rachael Fee, MD 11/23/2016 3:02:08 PM This report has been signed electronically.

## 2016-11-26 ENCOUNTER — Telehealth: Payer: Self-pay | Admitting: *Deleted

## 2016-11-26 NOTE — Telephone Encounter (Signed)
  Follow up Call-  Call back number 11/23/2016  Post procedure Call Back phone  # [phone]  Permission to leave phone message Yes  Some recent data might be hidden     Patient questions:  Do you have a fever, pain , or abdominal swelling? No. Pain Score  0 *  Have you tolerated food without any problems? Yes.    Have you been able to return to your normal activities? Yes.    Do you have any questions about your discharge instructions: Diet   No. Medications  No. Follow up visit  No.  Do you have questions or concerns about your Care? No.  Actions: * If pain score is 4 or above: No action needed, pain <4.

## 2016-11-29 ENCOUNTER — Encounter (HOSPITAL_BASED_OUTPATIENT_CLINIC_OR_DEPARTMENT_OTHER): Payer: Self-pay | Admitting: *Deleted

## 2016-11-29 ENCOUNTER — Emergency Department (HOSPITAL_BASED_OUTPATIENT_CLINIC_OR_DEPARTMENT_OTHER)
Admission: EM | Admit: 2016-11-29 | Discharge: 2016-11-29 | Disposition: A | Discharge status: Home / Self Care | Payer: BLUE CROSS/BLUE SHIELD | Attending: Emergency Medicine | Admitting: Emergency Medicine

## 2016-11-29 DIAGNOSIS — F17210 Nicotine dependence, cigarettes, uncomplicated: Secondary | ICD-10-CM | POA: Diagnosis not present

## 2016-11-29 DIAGNOSIS — R1013 Epigastric pain: Secondary | ICD-10-CM | POA: Diagnosis present

## 2016-11-29 DIAGNOSIS — K859 Acute pancreatitis without necrosis or infection, unspecified: Secondary | ICD-10-CM

## 2016-11-29 DIAGNOSIS — I10 Essential (primary) hypertension: Secondary | ICD-10-CM | POA: Insufficient documentation

## 2016-11-29 LAB — CBC WITH DIFFERENTIAL/PLATELET
BASOS PCT: 1 %
Basophils Absolute: 0.1 10*3/uL (ref 0.0–0.1)
EOS ABS: 0.3 10*3/uL (ref 0.0–0.7)
EOS PCT: 4 %
HEMATOCRIT: 37.8 % — AB (ref 39.0–52.0)
HEMOGLOBIN: 12.5 g/dL — AB (ref 13.0–17.0)
LYMPHS PCT: 12 %
Lymphs Abs: 1 10*3/uL (ref 0.7–4.0)
MCH: 32.1 pg (ref 26.0–34.0)
MCHC: 33.1 g/dL (ref 30.0–36.0)
MCV: 96.9 fL (ref 78.0–100.0)
MONOS PCT: 6 %
Monocytes Absolute: 0.5 10*3/uL (ref 0.1–1.0)
NEUTROS ABS: 6.4 10*3/uL (ref 1.7–7.7)
NEUTROS PCT: 77 %
Platelets: 300 10*3/uL (ref 150–400)
RBC: 3.9 MIL/uL — ABNORMAL LOW (ref 4.22–5.81)
RDW: 12.9 % (ref 11.5–15.5)
WBC: 8.3 10*3/uL (ref 4.0–10.5)

## 2016-11-29 LAB — COMPREHENSIVE METABOLIC PANEL
ALK PHOS: 76 U/L (ref 38–126)
ALT: 12 U/L — AB (ref 17–63)
ANION GAP: 12 (ref 5–15)
AST: 20 U/L (ref 15–41)
Albumin: 3.9 g/dL (ref 3.5–5.0)
BILIRUBIN TOTAL: 0.4 mg/dL (ref 0.3–1.2)
BUN: 15 mg/dL (ref 6–20)
CALCIUM: 9.3 mg/dL (ref 8.9–10.3)
CO2: 23 mmol/L (ref 22–32)
CREATININE: 0.74 mg/dL (ref 0.61–1.24)
Chloride: 103 mmol/L (ref 101–111)
Glucose, Bld: 172 mg/dL — ABNORMAL HIGH (ref 65–99)
Potassium: 3.9 mmol/L (ref 3.5–5.1)
Sodium: 138 mmol/L (ref 135–145)
TOTAL PROTEIN: 7.9 g/dL (ref 6.5–8.1)

## 2016-11-29 LAB — URINALYSIS, ROUTINE W REFLEX MICROSCOPIC
BILIRUBIN URINE: NEGATIVE
Glucose, UA: NEGATIVE mg/dL
HGB URINE DIPSTICK: NEGATIVE
KETONES UR: 15 mg/dL — AB
Leukocytes, UA: NEGATIVE
NITRITE: NEGATIVE
Protein, ur: NEGATIVE mg/dL
SPECIFIC GRAVITY, URINE: 1.02 (ref 1.005–1.030)
pH: 5 (ref 5.0–8.0)

## 2016-11-29 LAB — LIPASE, BLOOD: Lipase: 212 U/L — ABNORMAL HIGH (ref 11–51)

## 2016-11-29 MED ORDER — PROMETHAZINE HCL 25 MG PO TABS: 25.0000 mg | ORAL_TABLET | Freq: Four times a day (QID) | ORAL | 0 refills | Status: DC | PRN

## 2016-11-29 MED ORDER — OXYCODONE-ACETAMINOPHEN 5-325 MG PO TABS: 1.0000 | ORAL_TABLET | Freq: Four times a day (QID) | ORAL | 0 refills | Status: DC | PRN

## 2016-11-29 MED ORDER — ONDANSETRON HCL 4 MG/2ML IJ SOLN
4.0000 mg | Freq: Once | INTRAMUSCULAR | Status: AC
  Administered 2016-11-29: 4 mg via INTRAVENOUS
  Filled 2016-11-29: qty 2

## 2016-11-29 MED ORDER — HYDROMORPHONE HCL 1 MG/ML IJ SOLN
1.0000 mg | Freq: Once | INTRAMUSCULAR | Status: AC
  Administered 2016-11-29: 1 mg via INTRAVENOUS
  Filled 2016-11-29: qty 1

## 2016-11-29 MED ORDER — ONDANSETRON 4 MG PO TBDP: 4.0000 mg | ORAL_TABLET | Freq: Three times a day (TID) | ORAL | 0 refills | Status: DC | PRN

## 2016-11-29 MED ORDER — SODIUM CHLORIDE 0.9 % IV SOLN
Freq: Once | INTRAVENOUS | Status: AC
Start: 2016-11-29 — End: 2016-11-29
  Administered 2016-11-29: 1000 mL via INTRAVENOUS

## 2016-11-29 MED ORDER — FENTANYL CITRATE (PF) 100 MCG/2ML IJ SOLN
50.0000 ug | Freq: Once | INTRAMUSCULAR | Status: AC
  Administered 2016-11-29: 50 ug via INTRAVENOUS
  Filled 2016-11-29: qty 2

## 2016-11-29 MED FILL — PROMETHAZINE 25 MG TABLET: 25 | 7 days supply | Qty: 30 | Fill #0

## 2016-11-29 MED FILL — OXYCODONE/APAP 5/325 MG TAB: 5-325 | 2 days supply | Qty: 20 | Fill #0

## 2016-11-29 MED FILL — ONDANSETRON ODT 4 MG TABLET: 4 | 2 days supply | Qty: 9 | Fill #0

## 2016-11-29 NOTE — ED Provider Notes (Signed)
[facility] Provider Note   CSN: [id number] Arrival date & time: 11/29/16  0440     History   Chief Complaint Chief Complaint  Patient presents with  . Abdominal Pain    HPI Samuel Huffman is a 51 y.o. male.  HPI  Pt with hx of alcohol induced pancreatitis presenting with epigastric pain and vomiting that is similar to his prior flares of pancreatitis.  He states symptoms began several hours  Prior to arrival.  He has had numerous episodes of emesis- nonbloody and nonbiliious.  No fever/chills. He has not been able to keep down any liquids.  Pain is sharp and gnawing in nature.  He denies any recent etoh.  He had a colonoscopy approx 1 week ago but did well with the procedure and had no complications or pain afterward.  There are no other associated systemic symptoms, there are no other alleviating or modifying factors.   Past Medical History:  Diagnosis Date  . Acute upper GI bleeding 08/02/2016  . Anxiety   . Blood transfusion without reported diagnosis   . Cirrhosis of liver with ascites (HCC)   . Hiatal hernia   . History of alcohol abuse   . Hypertension   . Insomnia   . Pancreatic pseudocyst   . Pancreatitis   . Portal hypertensive gastropathy (HCC)   . PTSD (post-traumatic stress disorder)   . Pulmonary nodule   . Varices, gastric     Patient Active Problem List   Diagnosis Date Noted  . Hyponatremia 08/04/2016  . Hypokalemia 08/04/2016  . Varices, gastric   . Abnormal CT scan, stomach   . Upper GI bleed 08/02/2016  . Hematemesis 08/02/2016  . Essential hypertension 08/02/2016  . Acute upper GI bleeding 08/02/2016  . Pancreatic pseudocyst   . Pulmonary nodule   . Acute recurrent pancreatitis 10/01/2014  . Elevated BP 10/01/2014  . AKI (acute kidney injury) (HCC) 10/01/2014  . History of alcohol abuse 10/01/2014  . Lactic acidosis 10/01/2014  . High anion gap metabolic acidosis 10/01/2014  . Insomnia 07/05/2014    Past Surgical History:    Procedure Laterality Date  . ESOPHAGOGASTRODUODENOSCOPY (EGD) WITH PROPOFOL N/A 08/03/2016   Procedure: ESOPHAGOGASTRODUODENOSCOPY (EGD) WITH PROPOFOL;  Surgeon: Rachael Fee, MD;  Location: [facility] ENDOSCOPY;  Service: Endoscopy;  Laterality: N/A;  . EUS  08/03/2016   Procedure: UPPER ENDOSCOPIC ULTRASOUND (EUS) LINEAR;  Surgeon: Rachael Fee, MD;  [facility] ENDOSCOPY;  Service: Endoscopy;;  . FEMUR SURGERY Left        Home Medications    Prior to Admission medications   Medication Sig Start Date End Date Taking? Authorizing Provider  acetaminophen (TYLENOL) 325 MG tablet Take 2 tablets (650 mg total) by mouth every 4 (four) hours as needed for fever, headache or mild pain. Patient taking differently: Take 650 mg by mouth as needed for fever, headache or mild pain.  08/04/16   Rama, Maryruth Bun, MD  ondansetron (ZOFRAN ODT) 4 MG disintegrating tablet Take 1 tablet (4 mg total) by mouth every 8 (eight) hours as needed for nausea or vomiting. 11/29/16   Jerelyn Scott, MD  oxyCODONE-acetaminophen (PERCOCET/ROXICET) 5-325 MG tablet Take 1-2 tablets by mouth every 6 (six) hours as needed for severe pain. 11/29/16   Jerelyn Scott, MD  pantoprazole (PROTONIX) 40 MG tablet Take 1 tablet (40 mg total) by mouth daily at 6 (six) AM. 10/15/16   Rachael Fee, MD  promethazine (PHENERGAN) 25 MG tablet Take 1 tablet (25  mg total) by mouth every 6 (six) hours as needed for nausea or vomiting. 11/29/16   Jerelyn Scott, MD    Family History Family History  Problem Relation Age of Onset  . Adopted: Yes  . Healthy Son        x1    Social History Social History  Substance Use Topics  . Smoking status: Current Every Day Smoker    Packs/day: 0.50    Types: Cigarettes  . Smokeless tobacco: Current User    Types: Snuff  . Alcohol use 0.0 oz/week     Allergies   Patient has no known allergies.   Review of Systems Review of Systems  ROS reviewed and all otherwise negative except for mentioned  in HPI   Physical Exam Updated Vital Signs BP 133/88 (BP Location: Left Arm)   Pulse 72   Temp 98.1 F (36.7 C) (Oral)   Resp 14   Ht 6\' 2"  (1.88 m)   Wt 90.7 kg (200 lb)   SpO2 94%   BMI 25.68 kg/m  Vitals reviewed Physical Exam Physical Examination: General appearance - alert, uncomfortable appearing, and in no distress Mental status - alert, oriented to person, place, and time Eyes - no conjunctival injection, no scleral icterus Mouth - mucous membranes moist, pharynx normal without lesions Chest - clear to auscultation, no wheezes, rales or rhonchi, symmetric air entry Heart - normal rate, regular rhythm, normal S1, S2, no murmurs, rubs, clicks or gallops Abdomen - soft, TTP in epigastric region, + gaurding, no rebound tenderness, NABS, nondistended, no masses or organomegaly Neurological - alert, oriented, normal speech Extremities - peripheral pulses normal, no pedal edema, no clubbing or cyanosis Skin - normal coloration and turgor, no rashes  ED Treatments / Results  Labs (all labs ordered are listed, but only abnormal results are displayed) Labs Reviewed  CBC WITH DIFFERENTIAL/PLATELET - Abnormal; Notable for the following:       Result Value   RBC 3.90 (*)    Hemoglobin 12.5 (*)    HCT 37.8 (*)    All other components within normal limits  COMPREHENSIVE METABOLIC PANEL - Abnormal; Notable for the following:    Glucose, Bld 172 (*)    ALT 12 (*)    All other components within normal limits  LIPASE, BLOOD - Abnormal; Notable for the following:    Lipase 212 (*)    All other components within normal limits  URINALYSIS, ROUTINE W REFLEX MICROSCOPIC - Abnormal; Notable for the following:    Ketones, ur 15 (*)    All other components within normal limits    EKG  EKG Interpretation None       Radiology No results found.  Procedures Procedures (including critical care time)  Medications Ordered in ED Medications  ondansetron (ZOFRAN) injection 4 mg  (4 mg Intravenous Given 11/29/16 0522)  fentaNYL (SUBLIMAZE) injection 50 mcg (50 mcg Intravenous Given 11/29/16 0525)  0.9 %  sodium chloride infusion ( Intravenous Stopped 11/29/16 0712)  HYDROmorphone (DILAUDID) injection 1 mg (1 mg Intravenous Given 11/29/16 0537)  HYDROmorphone (DILAUDID) injection 1 mg (1 mg Intravenous Given 11/29/16 0622)  ondansetron (ZOFRAN) injection 4 mg (4 mg Intravenous Given 11/29/16 0622)  HYDROmorphone (DILAUDID) injection 1 mg (1 mg Intravenous Given 11/29/16 0705)     Initial Impression / Assessment and Plan / ED Course  I have reviewed the triage vital signs and the nursing notes.  Pertinent labs & imaging results that were available during my care of the patient  were reviewed by me and considered in my medical decision making (see chart for details).    7:26 AM pt is feeling much improved after his 3rd dose of dilaudid.  He feels that his nausea is improved and he feels that he will be able to tolerate his symptoms at home .  We discussed pain meds, nausea meds, clear liquids for 24-48 hours then slowly advancing to bland diet.  Discussed strict return precautions.     Final Clinical Impressions(s) / ED Diagnoses   Final diagnoses:  Acute pancreatitis, unspecified complication status, unspecified pancreatitis type    New Prescriptions Discharge Medication List as of 11/29/2016  7:28 AM    START taking these medications   Details  ondansetron (ZOFRAN ODT) 4 MG disintegrating tablet Take 1 tablet (4 mg total) by mouth every 8 (eight) hours as needed for nausea or vomiting., Starting Thu 11/29/2016, Print    oxyCODONE-acetaminophen (PERCOCET/ROXICET) 5-325 MG tablet Take 1-2 tablets by mouth every 6 (six) hours as needed for severe pain., Starting Thu 11/29/2016, Print    promethazine (PHENERGAN) 25 MG tablet Take 1 tablet (25 mg total) by mouth every 6 (six) hours as needed for nausea or vomiting., Starting Thu 11/29/2016, Print         Jerelyn ScottLinker, Conn Trombetta, MD 11/29/16  [phone]

## 2016-11-29 NOTE — Discharge Instructions (Signed)
Return to the ED with any concerns including vomiting and not able to keep down liquids or your medications, abdominal pain especially if it localizes to the right lower abdomen, fever or chills, and decreased urine output, decreased level of alertness or lethargy, or any other alarming symptoms.  °

## 2016-11-29 NOTE — ED Triage Notes (Signed)
C/o abd pain w n/v onset 6 hours ago  Hx of same

## 2016-11-29 NOTE — ED Notes (Signed)
Pt able to tolerate ice chips with no n/v.

## 2016-12-13 ENCOUNTER — Emergency Department (HOSPITAL_BASED_OUTPATIENT_CLINIC_OR_DEPARTMENT_OTHER)
Admission: EM | Admit: 2016-12-13 | Discharge: 2016-12-13 | Disposition: A | Discharge status: Home / Self Care | Payer: BLUE CROSS/BLUE SHIELD | Attending: Emergency Medicine | Admitting: Emergency Medicine

## 2016-12-13 ENCOUNTER — Encounter (HOSPITAL_BASED_OUTPATIENT_CLINIC_OR_DEPARTMENT_OTHER): Payer: Self-pay

## 2016-12-13 DIAGNOSIS — I10 Essential (primary) hypertension: Secondary | ICD-10-CM | POA: Insufficient documentation

## 2016-12-13 DIAGNOSIS — F17210 Nicotine dependence, cigarettes, uncomplicated: Secondary | ICD-10-CM | POA: Insufficient documentation

## 2016-12-13 DIAGNOSIS — R112 Nausea with vomiting, unspecified: Secondary | ICD-10-CM | POA: Diagnosis not present

## 2016-12-13 DIAGNOSIS — Z79899 Other long term (current) drug therapy: Secondary | ICD-10-CM | POA: Diagnosis not present

## 2016-12-13 DIAGNOSIS — K920 Hematemesis: Secondary | ICD-10-CM | POA: Diagnosis present

## 2016-12-13 DIAGNOSIS — F17220 Nicotine dependence, chewing tobacco, uncomplicated: Secondary | ICD-10-CM | POA: Insufficient documentation

## 2016-12-13 LAB — CBC WITH DIFFERENTIAL/PLATELET
BASOS PCT: 0 %
Basophils Absolute: 0 10*3/uL (ref 0.0–0.1)
Eosinophils Absolute: 0.1 10*3/uL (ref 0.0–0.7)
Eosinophils Relative: 1 %
HEMATOCRIT: 35.9 % — AB (ref 39.0–52.0)
HEMOGLOBIN: 11.9 g/dL — AB (ref 13.0–17.0)
LYMPHS ABS: 0.8 10*3/uL (ref 0.7–4.0)
Lymphocytes Relative: 12 %
MCH: 32.4 pg (ref 26.0–34.0)
MCHC: 33.1 g/dL (ref 30.0–36.0)
MCV: 97.8 fL (ref 78.0–100.0)
MONOS PCT: 7 %
Monocytes Absolute: 0.5 10*3/uL (ref 0.1–1.0)
NEUTROS ABS: 5.6 10*3/uL (ref 1.7–7.7)
NEUTROS PCT: 80 %
Platelets: 213 10*3/uL (ref 150–400)
RBC: 3.67 MIL/uL — ABNORMAL LOW (ref 4.22–5.81)
RDW: 13.5 % (ref 11.5–15.5)
WBC: 7 10*3/uL (ref 4.0–10.5)

## 2016-12-13 LAB — COMPREHENSIVE METABOLIC PANEL
ALK PHOS: 69 U/L (ref 38–126)
ALT: 13 U/L — ABNORMAL LOW (ref 17–63)
ANION GAP: 12 (ref 5–15)
AST: 28 U/L (ref 15–41)
Albumin: 4.1 g/dL (ref 3.5–5.0)
BILIRUBIN TOTAL: 0.6 mg/dL (ref 0.3–1.2)
BUN: 25 mg/dL — AB (ref 6–20)
CALCIUM: 9.4 mg/dL (ref 8.9–10.3)
CO2: 23 mmol/L (ref 22–32)
CREATININE: 0.92 mg/dL (ref 0.61–1.24)
Chloride: 99 mmol/L — ABNORMAL LOW (ref 101–111)
GFR calc Af Amer: >60 mL/min (ref >60)
GFR calc non Af Amer: >60 mL/min (ref >60)
Glucose, Bld: 182 mg/dL — ABNORMAL HIGH (ref 65–99)
Potassium: 3.7 mmol/L (ref 3.5–5.1)
Sodium: 134 mmol/L — ABNORMAL LOW (ref 135–145)
TOTAL PROTEIN: 8 g/dL (ref 6.5–8.1)

## 2016-12-13 LAB — URINALYSIS, ROUTINE W REFLEX MICROSCOPIC
BILIRUBIN URINE: NEGATIVE
GLUCOSE, UA: NEGATIVE mg/dL
HGB URINE DIPSTICK: NEGATIVE
KETONES UR: 15 mg/dL — AB
Leukocytes, UA: NEGATIVE
NITRITE: NEGATIVE
PH: 5.5 (ref 5.0–8.0)
Protein, ur: 30 mg/dL — AB
Specific Gravity, Urine: 1.031 — ABNORMAL HIGH (ref 1.005–1.030)

## 2016-12-13 LAB — LIPASE, BLOOD: Lipase: 77 U/L — ABNORMAL HIGH (ref 11–51)

## 2016-12-13 LAB — URINALYSIS, MICROSCOPIC (REFLEX)
Bacteria, UA: NONE SEEN
Squamous Epithelial / LPF: NONE SEEN

## 2016-12-13 MED ORDER — LACTATED RINGERS IV BOLUS (SEPSIS)
1000.0000 mL | Freq: Once | INTRAVENOUS | Status: AC
  Administered 2016-12-13: 1000 mL via INTRAVENOUS

## 2016-12-13 MED ORDER — PROMETHAZINE HCL 25 MG PO TABS: 25.0000 mg | ORAL_TABLET | Freq: Four times a day (QID) | ORAL | 0 refills | Status: DC | PRN

## 2016-12-13 MED ORDER — PROMETHAZINE HCL 25 MG/ML IJ SOLN
25.0000 mg | Freq: Once | INTRAMUSCULAR | Status: AC
  Administered 2016-12-13: 25 mg via INTRAVENOUS
  Filled 2016-12-13: qty 1

## 2016-12-13 MED ORDER — HYDROMORPHONE HCL 1 MG/ML IJ SOLN
1.0000 mg | Freq: Once | INTRAMUSCULAR | Status: AC
  Administered 2016-12-13: 1 mg via INTRAVENOUS
  Filled 2016-12-13: qty 1

## 2016-12-13 NOTE — ED Triage Notes (Addendum)
C/o vomiting since last night-vomited large amount bright red blood x 1 approx 1 hour PTA-presents to triage with ziplock bag with bloody emesis-NAD-steady gait

## 2016-12-13 NOTE — ED Provider Notes (Signed)
[facility] Provider Note   CSN: [id number] Arrival date & time: 12/13/16  1324   By signing my name below, I, Freida Busman, attest that this documentation has been prepared under the direction and in the presence of Bela Bonaparte, Barbara Cower, MD . Electronically Signed: Freida Busman, Scribe. 12/13/2016. 9:19 PM.   History   Chief Complaint Chief Complaint  Patient presents with  . Hematemesis    The history is provided by the patient. No language interpreter was used.     HPI Comments:  Samuel Huffman is a 51 y.o. male who presents to the Emergency Department complaining of an episode of hematemesis this afternoon. He has had nausea and  vomiting since yesterday ~2200 but no blood seen until today. He admits to drinking a light orange gatorade prior to today's episode of vomiting. Pt has a h/o coffee ground emesis secondary to gastric varices. He reports associated 8/10 epigastric abdominal pain. Pt also has a h/o milder chronic abdominal pain due to chronic pancreatitis originally due to alcohol consumption. Pt denies recent alcohol use. Pt also describes feeling overheated x 3 days.    Past Medical History:  Diagnosis Date  . Acute upper GI bleeding 08/02/2016  . Anxiety   . Blood transfusion without reported diagnosis   . Cirrhosis of liver with ascites (HCC)   . Hiatal hernia   . History of alcohol abuse   . Hypertension   . Insomnia   . Pancreatic pseudocyst   . Pancreatitis   . Portal hypertensive gastropathy (HCC)   . PTSD (post-traumatic stress disorder)   . Pulmonary nodule   . Varices, gastric     Patient Active Problem List   Diagnosis Date Noted  . Hyponatremia 08/04/2016  . Hypokalemia 08/04/2016  . Varices, gastric   . Abnormal CT scan, stomach   . Upper GI bleed 08/02/2016  . Hematemesis 08/02/2016  . Essential hypertension 08/02/2016  . Acute upper GI bleeding 08/02/2016  . Pancreatic pseudocyst   . Pulmonary nodule   . Acute recurrent  pancreatitis 10/01/2014  . Elevated BP 10/01/2014  . AKI (acute kidney injury) (HCC) 10/01/2014  . History of alcohol abuse 10/01/2014  . Lactic acidosis 10/01/2014  . High anion gap metabolic acidosis 10/01/2014  . Insomnia 07/05/2014    Past Surgical History:  Procedure Laterality Date  . ESOPHAGOGASTRODUODENOSCOPY (EGD) WITH PROPOFOL N/A 08/03/2016   Procedure: ESOPHAGOGASTRODUODENOSCOPY (EGD) WITH PROPOFOL;  Surgeon: Rachael Fee, MD;  Location: [facility] ENDOSCOPY;  Service: Endoscopy;  Laterality: N/A;  . EUS  08/03/2016   Procedure: UPPER ENDOSCOPIC ULTRASOUND (EUS) LINEAR;  Surgeon: Rachael Fee, MD;  Location: [facility] ENDOSCOPY;  Service: Endoscopy;;  . FEMUR SURGERY Left        Home Medications    Prior to Admission medications   Medication Sig Start Date End Date Taking? Authorizing Provider  pantoprazole (PROTONIX) 40 MG tablet Take 1 tablet (40 mg total) by mouth daily at 6 (six) AM. 10/15/16   Rachael Fee, MD  promethazine (PHENERGAN) 25 MG tablet Take 1 tablet (25 mg total) by mouth every 6 (six) hours as needed for nausea or vomiting. 12/13/16   Ardit Danh, Barbara Cower, MD    Family History Family History  Problem Relation Age of Onset  . Adopted: Yes  . Healthy Son        x1    Social History Social History  Substance Use Topics  . Smoking status: Current Every Day Smoker    Packs/day: 0.50  Types: Cigarettes  . Smokeless tobacco: Current User    Types: Snuff  . Alcohol use No     Allergies   Patient has no known allergies.   Review of Systems Review of Systems  Constitutional: Negative for fever.  Gastrointestinal: Positive for abdominal pain, nausea and vomiting.       +hematemesis   All other systems reviewed and are negative.    Physical Exam Updated Vital Signs BP 126/79   Pulse 96   Temp 99.4 F (37.4 C) (Oral)   Resp 19   Ht 6\' 2"  (1.88 m)   Wt 90.3 kg (199 lb)   SpO2 100%   BMI 25.55 kg/m   Physical Exam  Constitutional: He is  oriented to person, place, and time. He appears well-developed and well-nourished. No distress.  HENT:  Head: Normocephalic and atraumatic.  Eyes: Conjunctivae are normal.  Neck: Normal range of motion. Neck supple.  Cardiovascular: Regular rhythm.  Tachycardia present.   No murmur heard. Pulmonary/Chest: Effort normal and breath sounds normal. No respiratory distress.  Abdominal: Soft. He exhibits no distension. There is no tenderness. There is no guarding.  Neurological: He is alert and oriented to person, place, and time.  Skin: Skin is warm and dry. No rash noted. No erythema.  No pallor  Psychiatric: He has a normal mood and affect. His behavior is normal.  Nursing note and vitals reviewed.    ED Treatments / Results  DIAGNOSTIC STUDIES:  Oxygen Saturation is 99% on RA, normal by my interpretation.    COORDINATION OF CARE:  9:15 PM Discussed treatment plan with pt at bedside and pt agreed to plan.  Labs (all labs ordered are listed, but only abnormal results are displayed) Labs Reviewed  CBC WITH DIFFERENTIAL/PLATELET - Abnormal; Notable for the following:       Result Value   RBC 3.67 (*)    Hemoglobin 11.9 (*)    HCT 35.9 (*)    All other components within normal limits  COMPREHENSIVE METABOLIC PANEL - Abnormal; Notable for the following:    Sodium 134 (*)    Chloride 99 (*)    Glucose, Bld 182 (*)    BUN 25 (*)    ALT 13 (*)    All other components within normal limits  LIPASE, BLOOD - Abnormal; Notable for the following:    Lipase 77 (*)    All other components within normal limits  URINALYSIS, ROUTINE W REFLEX MICROSCOPIC - Abnormal; Notable for the following:    Color, Urine AMBER (*)    Specific Gravity, Urine 1.031 (*)    Ketones, ur 15 (*)    Protein, ur 30 (*)    All other components within normal limits  URINALYSIS, MICROSCOPIC (REFLEX)    EKG  EKG Interpretation  Date/Time:  Thursday December 13 2016 21:12:29 EDT Ventricular Rate:  104 PR  Interval:    QRS Duration: 84 QT Interval:  349 QTC Calculation: 459 R Axis:   74 Text Interpretation:  Sinus tachycardia RSR' in V1 or V2, right VCD or RVH When compared with ECG of 08/02/2016, No significant change was found Confirmed by Dione Booze ([id number]) on 12/13/2016 11:08:03 PM       Radiology No results found.  Procedures Procedures (including critical care time)  Medications Ordered in ED Medications  lactated ringers bolus 1,000 mL (0 mLs Intravenous Stopped 12/13/16 2341)  HYDROmorphone (DILAUDID) injection 1 mg (1 mg Intravenous Given 12/13/16 2147)  promethazine (PHENERGAN) injection 25 mg (25  mg Intravenous Given 12/13/16 2147)     Initial Impression / Assessment and Plan / ED Course  I have reviewed the triage vital signs and the nursing notes.  Pertinent labs & imaging results that were available during my care of the patient were reviewed by me and considered in my medical decision making (see chart for details).     Suspect some component of malingering as the ziplock bag with dark red fluid in it does not appear to be entirely composed of blood however it does appear to have some component of blood? Either way, HR improved with fluids. Tolerating PO. Pain improved. Hb stable. Lipase slightly elevated but no other sign of severe disease. Will plan for dc with pcp follow up and anti-emetics. No indication for imaging or admission at this time and patient states he would refuse admission even if offered.   Final Clinical Impressions(s) / ED Diagnoses   Final diagnoses:  Non-intractable vomiting with nausea, unspecified vomiting type    New Prescriptions Discharge Medication List as of 12/13/2016 11:25 PM    START taking these medications   Details  promethazine (PHENERGAN) 25 MG tablet Take 1 tablet (25 mg total) by mouth every 6 (six) hours as needed for nausea or vomiting., Starting Thu 12/13/2016, Print       I personally performed the services described in  this documentation, which was scribed in my presence. The recorded information has been reviewed and is accurate.      Mariela Rex, Barbara CowerJason, MD 12/14/16 [phone]

## 2016-12-17 ENCOUNTER — Encounter (HOSPITAL_BASED_OUTPATIENT_CLINIC_OR_DEPARTMENT_OTHER): Payer: Self-pay | Admitting: *Deleted

## 2016-12-17 ENCOUNTER — Emergency Department (HOSPITAL_BASED_OUTPATIENT_CLINIC_OR_DEPARTMENT_OTHER)
Admission: EM | Admit: 2016-12-17 | Discharge: 2016-12-17 | Discharge status: Against Medical Advice | Payer: BLUE CROSS/BLUE SHIELD | Source: Home / Self Care

## 2016-12-17 DIAGNOSIS — K3189 Other diseases of stomach and duodenum: Secondary | ICD-10-CM | POA: Diagnosis present

## 2016-12-17 DIAGNOSIS — I10 Essential (primary) hypertension: Secondary | ICD-10-CM | POA: Diagnosis not present

## 2016-12-17 DIAGNOSIS — K7030 Alcoholic cirrhosis of liver without ascites: Secondary | ICD-10-CM | POA: Diagnosis present

## 2016-12-17 DIAGNOSIS — K920 Hematemesis: Secondary | ICD-10-CM

## 2016-12-17 DIAGNOSIS — F4310 Post-traumatic stress disorder, unspecified: Secondary | ICD-10-CM | POA: Diagnosis present

## 2016-12-17 DIAGNOSIS — K863 Pseudocyst of pancreas: Secondary | ICD-10-CM | POA: Diagnosis present

## 2016-12-17 DIAGNOSIS — K648 Other hemorrhoids: Secondary | ICD-10-CM | POA: Diagnosis present

## 2016-12-17 DIAGNOSIS — K766 Portal hypertension: Secondary | ICD-10-CM | POA: Diagnosis not present

## 2016-12-17 DIAGNOSIS — I81 Portal vein thrombosis: Secondary | ICD-10-CM | POA: Diagnosis not present

## 2016-12-17 DIAGNOSIS — R1110 Vomiting, unspecified: Secondary | ICD-10-CM | POA: Diagnosis not present

## 2016-12-17 DIAGNOSIS — K861 Other chronic pancreatitis: Secondary | ICD-10-CM | POA: Diagnosis present

## 2016-12-17 DIAGNOSIS — K922 Gastrointestinal hemorrhage, unspecified: Secondary | ICD-10-CM | POA: Diagnosis present

## 2016-12-17 DIAGNOSIS — R109 Unspecified abdominal pain: Secondary | ICD-10-CM | POA: Diagnosis not present

## 2016-12-17 DIAGNOSIS — I82890 Acute embolism and thrombosis of other specified veins: Secondary | ICD-10-CM | POA: Diagnosis not present

## 2016-12-17 DIAGNOSIS — D62 Acute posthemorrhagic anemia: Secondary | ICD-10-CM | POA: Diagnosis present

## 2016-12-17 DIAGNOSIS — R0602 Shortness of breath: Secondary | ICD-10-CM | POA: Diagnosis not present

## 2016-12-17 DIAGNOSIS — D735 Infarction of spleen: Secondary | ICD-10-CM | POA: Diagnosis not present

## 2016-12-17 DIAGNOSIS — Z9081 Acquired absence of spleen: Secondary | ICD-10-CM | POA: Diagnosis not present

## 2016-12-17 DIAGNOSIS — D649 Anemia, unspecified: Secondary | ICD-10-CM | POA: Diagnosis not present

## 2016-12-17 DIAGNOSIS — K660 Peritoneal adhesions (postprocedural) (postinfection): Secondary | ICD-10-CM | POA: Diagnosis not present

## 2016-12-17 DIAGNOSIS — R935 Abnormal findings on diagnostic imaging of other abdominal regions, including retroperitoneum: Secondary | ICD-10-CM | POA: Diagnosis not present

## 2016-12-17 DIAGNOSIS — D473 Essential (hemorrhagic) thrombocythemia: Secondary | ICD-10-CM | POA: Diagnosis not present

## 2016-12-17 DIAGNOSIS — K567 Ileus, unspecified: Secondary | ICD-10-CM | POA: Diagnosis not present

## 2016-12-17 DIAGNOSIS — R739 Hyperglycemia, unspecified: Secondary | ICD-10-CM | POA: Diagnosis present

## 2016-12-17 DIAGNOSIS — K860 Alcohol-induced chronic pancreatitis: Secondary | ICD-10-CM | POA: Diagnosis not present

## 2016-12-17 DIAGNOSIS — I864 Gastric varices: Secondary | ICD-10-CM | POA: Diagnosis not present

## 2016-12-17 DIAGNOSIS — F17210 Nicotine dependence, cigarettes, uncomplicated: Secondary | ICD-10-CM | POA: Diagnosis not present

## 2016-12-17 DIAGNOSIS — D500 Iron deficiency anemia secondary to blood loss (chronic): Secondary | ICD-10-CM | POA: Diagnosis not present

## 2016-12-17 DIAGNOSIS — Z87898 Personal history of other specified conditions: Secondary | ICD-10-CM | POA: Diagnosis not present

## 2016-12-17 DIAGNOSIS — Z79899 Other long term (current) drug therapy: Secondary | ICD-10-CM

## 2016-12-17 DIAGNOSIS — R110 Nausea: Secondary | ICD-10-CM | POA: Diagnosis not present

## 2016-12-17 DIAGNOSIS — K811 Chronic cholecystitis: Secondary | ICD-10-CM | POA: Diagnosis not present

## 2016-12-17 DIAGNOSIS — G8929 Other chronic pain: Secondary | ICD-10-CM | POA: Diagnosis present

## 2016-12-17 DIAGNOSIS — D7389 Other diseases of spleen: Secondary | ICD-10-CM | POA: Diagnosis not present

## 2016-12-17 DIAGNOSIS — Z5321 Procedure and treatment not carried out due to patient leaving prior to being seen by health care provider: Secondary | ICD-10-CM | POA: Insufficient documentation

## 2016-12-17 NOTE — ED Triage Notes (Signed)
Pt c/o vomiting blood with HX of same

## 2016-12-17 NOTE — ED Triage Notes (Signed)
Pt c/o vomiting blood, with hx of same , checked in earlier and left d/t wait

## 2016-12-17 NOTE — ED Notes (Signed)
Pt states he doesn't want to stay, states he is leaving

## 2016-12-18 ENCOUNTER — Encounter (HOSPITAL_COMMUNITY): Admission: EM | Disposition: A | Discharge status: Home / Self Care | Payer: Self-pay | Source: Home / Self Care

## 2016-12-18 ENCOUNTER — Encounter (HOSPITAL_COMMUNITY): Payer: Self-pay | Admitting: *Deleted

## 2016-12-18 ENCOUNTER — Inpatient Hospital Stay (HOSPITAL_COMMUNITY): Payer: BLUE CROSS/BLUE SHIELD | Admitting: Anesthesiology

## 2016-12-18 ENCOUNTER — Inpatient Hospital Stay (HOSPITAL_BASED_OUTPATIENT_CLINIC_OR_DEPARTMENT_OTHER)
Admission: EM | Admit: 2016-12-18 | Discharge: 2016-12-30 | DRG: 264 | Disposition: A | Discharge status: Home / Self Care | Payer: BLUE CROSS/BLUE SHIELD | Attending: Pulmonary Disease | Admitting: Pulmonary Disease

## 2016-12-18 DIAGNOSIS — D735 Infarction of spleen: Secondary | ICD-10-CM | POA: Diagnosis not present

## 2016-12-18 DIAGNOSIS — I82890 Acute embolism and thrombosis of other specified veins: Secondary | ICD-10-CM | POA: Diagnosis not present

## 2016-12-18 DIAGNOSIS — K567 Ileus, unspecified: Secondary | ICD-10-CM | POA: Diagnosis not present

## 2016-12-18 DIAGNOSIS — F17210 Nicotine dependence, cigarettes, uncomplicated: Secondary | ICD-10-CM | POA: Diagnosis present

## 2016-12-18 DIAGNOSIS — Z79899 Other long term (current) drug therapy: Secondary | ICD-10-CM | POA: Diagnosis not present

## 2016-12-18 DIAGNOSIS — K3189 Other diseases of stomach and duodenum: Secondary | ICD-10-CM

## 2016-12-18 DIAGNOSIS — F4310 Post-traumatic stress disorder, unspecified: Secondary | ICD-10-CM | POA: Diagnosis present

## 2016-12-18 DIAGNOSIS — K920 Hematemesis: Secondary | ICD-10-CM

## 2016-12-18 DIAGNOSIS — I864 Gastric varices: Principal | ICD-10-CM

## 2016-12-18 DIAGNOSIS — Z87898 Personal history of other specified conditions: Secondary | ICD-10-CM

## 2016-12-18 DIAGNOSIS — R109 Unspecified abdominal pain: Secondary | ICD-10-CM | POA: Diagnosis not present

## 2016-12-18 DIAGNOSIS — R1110 Vomiting, unspecified: Secondary | ICD-10-CM | POA: Diagnosis not present

## 2016-12-18 DIAGNOSIS — K860 Alcohol-induced chronic pancreatitis: Secondary | ICD-10-CM

## 2016-12-18 DIAGNOSIS — K7460 Unspecified cirrhosis of liver: Secondary | ICD-10-CM

## 2016-12-18 DIAGNOSIS — G8929 Other chronic pain: Secondary | ICD-10-CM | POA: Diagnosis present

## 2016-12-18 DIAGNOSIS — F1011 Alcohol abuse, in remission: Secondary | ICD-10-CM

## 2016-12-18 DIAGNOSIS — K660 Peritoneal adhesions (postprocedural) (postinfection): Secondary | ICD-10-CM | POA: Diagnosis not present

## 2016-12-18 DIAGNOSIS — K922 Gastrointestinal hemorrhage, unspecified: Secondary | ICD-10-CM | POA: Diagnosis not present

## 2016-12-18 DIAGNOSIS — K811 Chronic cholecystitis: Secondary | ICD-10-CM | POA: Diagnosis not present

## 2016-12-18 DIAGNOSIS — I81 Portal vein thrombosis: Secondary | ICD-10-CM | POA: Diagnosis not present

## 2016-12-18 DIAGNOSIS — K863 Pseudocyst of pancreas: Secondary | ICD-10-CM | POA: Diagnosis present

## 2016-12-18 DIAGNOSIS — R739 Hyperglycemia, unspecified: Secondary | ICD-10-CM | POA: Diagnosis present

## 2016-12-18 DIAGNOSIS — R935 Abnormal findings on diagnostic imaging of other abdominal regions, including retroperitoneum: Secondary | ICD-10-CM | POA: Diagnosis not present

## 2016-12-18 DIAGNOSIS — D62 Acute posthemorrhagic anemia: Secondary | ICD-10-CM

## 2016-12-18 DIAGNOSIS — D649 Anemia, unspecified: Secondary | ICD-10-CM | POA: Diagnosis not present

## 2016-12-18 DIAGNOSIS — R0602 Shortness of breath: Secondary | ICD-10-CM | POA: Diagnosis not present

## 2016-12-18 DIAGNOSIS — D7389 Other diseases of spleen: Secondary | ICD-10-CM | POA: Diagnosis not present

## 2016-12-18 DIAGNOSIS — D473 Essential (hemorrhagic) thrombocythemia: Secondary | ICD-10-CM | POA: Diagnosis not present

## 2016-12-18 DIAGNOSIS — I10 Essential (primary) hypertension: Secondary | ICD-10-CM | POA: Diagnosis not present

## 2016-12-18 DIAGNOSIS — Z9889 Other specified postprocedural states: Secondary | ICD-10-CM

## 2016-12-18 DIAGNOSIS — Z9081 Acquired absence of spleen: Secondary | ICD-10-CM | POA: Diagnosis not present

## 2016-12-18 DIAGNOSIS — K861 Other chronic pancreatitis: Secondary | ICD-10-CM | POA: Diagnosis not present

## 2016-12-18 DIAGNOSIS — D500 Iron deficiency anemia secondary to blood loss (chronic): Secondary | ICD-10-CM | POA: Diagnosis not present

## 2016-12-18 DIAGNOSIS — K648 Other hemorrhoids: Secondary | ICD-10-CM | POA: Diagnosis present

## 2016-12-18 DIAGNOSIS — K7030 Alcoholic cirrhosis of liver without ascites: Secondary | ICD-10-CM | POA: Diagnosis not present

## 2016-12-18 DIAGNOSIS — K766 Portal hypertension: Secondary | ICD-10-CM | POA: Diagnosis present

## 2016-12-18 HISTORY — PX: ESOPHAGOGASTRODUODENOSCOPY: SHX5428

## 2016-12-18 LAB — PROTIME-INR
INR: 0.96
INR: 1.05
PROTHROMBIN TIME: 12.8 s (ref 11.4–15.2)
Prothrombin Time: 13.7 seconds (ref 11.4–15.2)

## 2016-12-18 LAB — CBC
HCT: 20 % — ABNORMAL LOW (ref 39.0–52.0)
HEMATOCRIT: 22.5 % — AB (ref 39.0–52.0)
HEMOGLOBIN: 7.3 g/dL — AB (ref 13.0–17.0)
HEMOGLOBIN: 6.6 g/dL — AB (ref 13.0–17.0)
MCH: 31.5 pg (ref 26.0–34.0)
MCH: 32.7 pg (ref 26.0–34.0)
MCHC: 32.4 g/dL (ref 30.0–36.0)
MCHC: 33 g/dL (ref 30.0–36.0)
MCV: 97 fL (ref 78.0–100.0)
MCV: 99 fL (ref 78.0–100.0)
Platelets: 181 10*3/uL (ref 150–400)
Platelets: 140 10*3/uL — ABNORMAL LOW (ref 150–400)
RBC: 2.32 MIL/uL — ABNORMAL LOW (ref 4.22–5.81)
RBC: 2.02 MIL/uL — ABNORMAL LOW (ref 4.22–5.81)
RDW: 14 % (ref 11.5–15.5)
RDW: 14.1 % (ref 11.5–15.5)
WBC: 3.8 10*3/uL — AB (ref 4.0–10.5)
WBC: 4.2 10*3/uL (ref 4.0–10.5)

## 2016-12-18 LAB — COMPREHENSIVE METABOLIC PANEL
ALBUMIN: 4.2 g/dL (ref 3.5–5.0)
ALT: 13 U/L — ABNORMAL LOW (ref 17–63)
ANION GAP: 10 (ref 5–15)
AST: 22 U/L (ref 15–41)
Alkaline Phosphatase: 63 U/L (ref 38–126)
BILIRUBIN TOTAL: 0.8 mg/dL (ref 0.3–1.2)
BUN: 20 mg/dL (ref 6–20)
CHLORIDE: 99 mmol/L — AB (ref 101–111)
CO2: 26 mmol/L (ref 22–32)
Calcium: 9 mg/dL (ref 8.9–10.3)
Creatinine, Ser: 0.75 mg/dL (ref 0.61–1.24)
GFR calc Af Amer: >60 mL/min (ref >60)
GFR calc non Af Amer: >60 mL/min (ref >60)
GLUCOSE: 194 mg/dL — AB (ref 65–99)
POTASSIUM: 3.7 mmol/L (ref 3.5–5.1)
Sodium: 135 mmol/L (ref 135–145)
TOTAL PROTEIN: 7.6 g/dL (ref 6.5–8.1)

## 2016-12-18 LAB — CBC WITH DIFFERENTIAL/PLATELET
BASOS ABS: 0.1 10*3/uL (ref 0.0–0.1)
Basophils Relative: 1 %
EOS ABS: 0.1 10*3/uL (ref 0.0–0.7)
Eosinophils Relative: 1 %
HEMATOCRIT: 27.7 % — AB (ref 39.0–52.0)
Hemoglobin: 9.1 g/dL — ABNORMAL LOW (ref 13.0–17.0)
LYMPHS ABS: 1.3 10*3/uL (ref 0.7–4.0)
Lymphocytes Relative: 15 %
MCH: 33.1 pg (ref 26.0–34.0)
MCHC: 32.9 g/dL (ref 30.0–36.0)
MCV: 100.7 fL — ABNORMAL HIGH (ref 78.0–100.0)
MONO ABS: 0.5 10*3/uL (ref 0.1–1.0)
MONOS PCT: 6 %
NEUTROS ABS: 6.5 10*3/uL (ref 1.7–7.7)
Neutrophils Relative %: 77 %
PLATELETS: 313 10*3/uL (ref 150–400)
RBC: 2.75 MIL/uL — AB (ref 4.22–5.81)
RDW: 13.4 % (ref 11.5–15.5)
WBC: 8.5 10*3/uL (ref 4.0–10.5)

## 2016-12-18 LAB — MRSA PCR SCREENING: MRSA BY PCR: NEGATIVE

## 2016-12-18 LAB — PREPARE RBC (CROSSMATCH)

## 2016-12-18 LAB — ETHANOL

## 2016-12-18 LAB — TRIGLYCERIDES: TRIGLYCERIDES: 223 mg/dL — AB (ref <150)

## 2016-12-18 LAB — ABO/RH: ABO/RH(D): O POS

## 2016-12-18 LAB — LIPASE, BLOOD: Lipase: 71 U/L — ABNORMAL HIGH (ref 11–51)

## 2016-12-18 LAB — TSH: TSH: 0.272 u[IU]/mL — ABNORMAL LOW (ref 0.350–4.500)

## 2016-12-18 SURGERY — EGD (ESOPHAGOGASTRODUODENOSCOPY)
Anesthesia: General

## 2016-12-18 MED ORDER — PANTOPRAZOLE SODIUM 40 MG IV SOLR
40.0000 mg | Freq: Two times a day (BID) | INTRAVENOUS | Status: DC
  Administered 2016-12-21 – 2016-12-28 (×15): 40 mg via INTRAVENOUS
  Filled 2016-12-18 (×14): qty 40

## 2016-12-18 MED ORDER — HYDROMORPHONE HCL 1 MG/ML IJ SOLN
0.5000 mg | INTRAMUSCULAR | Status: DC | PRN
  Administered 2016-12-18 – 2016-12-21 (×19): 1 mg via INTRAVENOUS
  Filled 2016-12-18 (×19): qty 1

## 2016-12-18 MED ORDER — FENTANYL CITRATE (PF) 100 MCG/2ML IJ SOLN
INTRAMUSCULAR | Status: DC | PRN
  Administered 2016-12-18: 100 ug via INTRAVENOUS

## 2016-12-18 MED ORDER — FENTANYL CITRATE (PF) 100 MCG/2ML IJ SOLN: 100.0000 ug | INTRAMUSCULAR | Status: DC | PRN

## 2016-12-18 MED ORDER — LIDOCAINE 2% (20 MG/ML) 5 ML SYRINGE
INTRAMUSCULAR | Status: DC | PRN
  Administered 2016-12-18: 100 mg via INTRAVENOUS

## 2016-12-18 MED ORDER — SUCCINYLCHOLINE CHLORIDE 200 MG/10ML IV SOSY
PREFILLED_SYRINGE | INTRAVENOUS | Status: DC | PRN
  Administered 2016-12-18: 140 mg via INTRAVENOUS

## 2016-12-18 MED ORDER — CEFTRIAXONE SODIUM 1 G IJ SOLR
1.0000 g | Freq: Once | INTRAMUSCULAR | Status: AC
  Administered 2016-12-18: 1 g via INTRAVENOUS
  Filled 2016-12-18: qty 10

## 2016-12-18 MED ORDER — FENTANYL CITRATE (PF) 100 MCG/2ML IJ SOLN
100.0000 ug | INTRAMUSCULAR | Status: DC | PRN
  Administered 2016-12-18: 100 ug via INTRAVENOUS
  Filled 2016-12-18: qty 2

## 2016-12-18 MED ORDER — HYDROMORPHONE HCL 1 MG/ML IJ SOLN
1.0000 mg | Freq: Once | INTRAMUSCULAR | Status: AC
  Administered 2016-12-18: 1 mg via INTRAVENOUS
  Filled 2016-12-18: qty 1

## 2016-12-18 MED ORDER — ONDANSETRON HCL 4 MG PO TABS: 4.0000 mg | ORAL_TABLET | Freq: Four times a day (QID) | ORAL | Status: DC | PRN

## 2016-12-18 MED ORDER — DEXTROSE 5 % IV SOLN
2.0000 g | INTRAVENOUS | Status: DC
  Administered 2016-12-19: 2 g via INTRAVENOUS
  Filled 2016-12-18: qty 2

## 2016-12-18 MED ORDER — FENTANYL CITRATE (PF) 100 MCG/2ML IJ SOLN
INTRAMUSCULAR | Status: AC
  Filled 2016-12-18: qty 2

## 2016-12-18 MED ORDER — SODIUM CHLORIDE 0.9 % IV SOLN
80.0000 mg | Freq: Once | INTRAVENOUS | Status: AC
  Administered 2016-12-18: 80 mg via INTRAVENOUS
  Filled 2016-12-18: qty 80

## 2016-12-18 MED ORDER — MIDAZOLAM HCL 2 MG/2ML IJ SOLN
INTRAMUSCULAR | Status: AC
  Filled 2016-12-18: qty 4

## 2016-12-18 MED ORDER — PROMETHAZINE HCL 25 MG/ML IJ SOLN: 6.2500 mg | INTRAMUSCULAR | Status: DC | PRN

## 2016-12-18 MED ORDER — PROPOFOL 1000 MG/100ML IV EMUL: 0.0000 ug/kg/min | INTRAVENOUS | Status: DC

## 2016-12-18 MED ORDER — PROPOFOL 10 MG/ML IV BOLUS
INTRAVENOUS | Status: DC | PRN
  Administered 2016-12-18: 170 mg via INTRAVENOUS

## 2016-12-18 MED ORDER — SODIUM CHLORIDE 0.9 % IV SOLN
8.0000 mg/h | INTRAVENOUS | Status: DC
  Administered 2016-12-18 – 2016-12-19 (×2): 8 mg/h via INTRAVENOUS
  Filled 2016-12-18 (×5): qty 80

## 2016-12-18 MED ORDER — FENTANYL CITRATE (PF) 100 MCG/2ML IJ SOLN
INTRAMUSCULAR | Status: AC
  Filled 2016-12-18: qty 4

## 2016-12-18 MED ORDER — FENTANYL CITRATE (PF) 100 MCG/2ML IJ SOLN
50.0000 ug | Freq: Once | INTRAMUSCULAR | Status: AC
  Administered 2016-12-18: 50 ug via INTRAVENOUS
  Filled 2016-12-18: qty 2

## 2016-12-18 MED ORDER — SUCCINYLCHOLINE CHLORIDE 200 MG/10ML IV SOSY
PREFILLED_SYRINGE | INTRAVENOUS | Status: AC
  Filled 2016-12-18: qty 10

## 2016-12-18 MED ORDER — SODIUM CHLORIDE 0.9 % IV SOLN
INTRAVENOUS | Status: DC
  Administered 2016-12-20 – 2016-12-21 (×2): via INTRAVENOUS

## 2016-12-18 MED ORDER — OCTREOTIDE ACETATE 500 MCG/ML IJ SOLN
50.0000 ug/h | INTRAMUSCULAR | Status: DC
  Administered 2016-12-18 – 2016-12-20 (×7): 50 ug/h via INTRAVENOUS
  Filled 2016-12-18 (×9): qty 1

## 2016-12-18 MED ORDER — ONDANSETRON HCL 4 MG/2ML IJ SOLN: 4.0000 mg | Freq: Four times a day (QID) | INTRAMUSCULAR | Status: DC | PRN

## 2016-12-18 MED ORDER — PANTOPRAZOLE SODIUM 40 MG IV SOLR: 40.0000 mg | Freq: Every day | INTRAVENOUS | Status: DC

## 2016-12-18 MED ORDER — ONDANSETRON HCL 4 MG/2ML IJ SOLN
4.0000 mg | Freq: Once | INTRAMUSCULAR | Status: AC
Start: 2016-12-18 — End: 2016-12-18
  Administered 2016-12-18: 4 mg via INTRAVENOUS
  Filled 2016-12-18: qty 2

## 2016-12-18 MED ORDER — SODIUM CHLORIDE 0.9% FLUSH
3.0000 mL | Freq: Two times a day (BID) | INTRAVENOUS | Status: DC
  Administered 2016-12-18 – 2016-12-29 (×17): 3 mL via INTRAVENOUS

## 2016-12-18 MED ORDER — ACETAMINOPHEN 325 MG PO TABS: 650.0000 mg | ORAL_TABLET | Freq: Four times a day (QID) | ORAL | Status: DC | PRN

## 2016-12-18 MED ORDER — LIDOCAINE 2% (20 MG/ML) 5 ML SYRINGE
INTRAMUSCULAR | Status: AC
  Filled 2016-12-18: qty 5

## 2016-12-18 MED ORDER — ACETAMINOPHEN 325 MG PO TABS: 650.0000 mg | ORAL_TABLET | ORAL | Status: DC | PRN

## 2016-12-18 MED ORDER — SODIUM CHLORIDE 0.9 % IV SOLN
Freq: Once | INTRAVENOUS | Status: AC
  Administered 2016-12-18: 21:00:00 via INTRAVENOUS

## 2016-12-18 MED ORDER — SODIUM CHLORIDE 0.9 % IV SOLN
250.0000 mL | INTRAVENOUS | Status: DC | PRN
  Administered 2016-12-21: 15:00:00 via INTRAVENOUS

## 2016-12-18 MED ORDER — OCTREOTIDE LOAD VIA INFUSION
50.0000 ug | Freq: Once | INTRAVENOUS | Status: AC
  Administered 2016-12-18: 50 ug via INTRAVENOUS
  Filled 2016-12-18: qty 25

## 2016-12-18 MED ORDER — PROPOFOL 10 MG/ML IV BOLUS
INTRAVENOUS | Status: AC
  Filled 2016-12-18: qty 20

## 2016-12-18 MED ORDER — ONDANSETRON HCL 4 MG/2ML IJ SOLN
INTRAMUSCULAR | Status: AC
  Filled 2016-12-18: qty 2

## 2016-12-18 MED ORDER — OCTREOTIDE ACETATE 50 MCG/ML IJ SOLN
INTRAMUSCULAR | Status: AC
  Administered 2016-12-18: 50 ug
  Filled 2016-12-18: qty 1

## 2016-12-18 MED ORDER — MEPERIDINE HCL 25 MG/ML IJ SOLN: 6.2500 mg | INTRAMUSCULAR | Status: DC | PRN

## 2016-12-18 MED ORDER — ACETAMINOPHEN 650 MG RE SUPP: 650.0000 mg | Freq: Four times a day (QID) | RECTAL | Status: DC | PRN

## 2016-12-18 MED ORDER — ONDANSETRON HCL 4 MG/2ML IJ SOLN
INTRAMUSCULAR | Status: DC | PRN
  Administered 2016-12-18: 4 mg via INTRAVENOUS

## 2016-12-18 MED ORDER — HYDROCODONE-ACETAMINOPHEN 5-325 MG PO TABS
1.0000 | ORAL_TABLET | ORAL | Status: DC | PRN
  Administered 2016-12-20: 2 via ORAL
  Filled 2016-12-18: qty 2

## 2016-12-18 MED ORDER — PANTOPRAZOLE SODIUM 40 MG IV SOLR
40.0000 mg | Freq: Once | INTRAVENOUS | Status: AC
  Administered 2016-12-18: 40 mg via INTRAVENOUS
  Filled 2016-12-18: qty 40

## 2016-12-18 MED ORDER — OCTREOTIDE ACETATE 50 MCG/ML IJ SOLN
INTRAMUSCULAR | Status: AC
  Filled 2016-12-18: qty 9

## 2016-12-18 MED ORDER — LACTATED RINGERS IV SOLN
INTRAVENOUS | Status: DC
  Administered 2016-12-18: 15:00:00 via INTRAVENOUS

## 2016-12-18 MED ORDER — SODIUM CHLORIDE 0.9 % IV SOLN
INTRAVENOUS | Status: AC
  Administered 2016-12-19 – 2016-12-20 (×6): via INTRAVENOUS

## 2016-12-18 NOTE — Progress Notes (Addendum)
CRITICAL VALUE ALERT  Critical Value:  Hgb 6.6   Date & Time Notied:  6/26     2014  Provider Notified: Dr. Delton CoombesByrum  Orders Received/Actions taken:

## 2016-12-18 NOTE — ED Provider Notes (Signed)
Medical screening exam:  Patient transferred from [facility] for further management of presumed esophageal variceal bleed. Patient presented with a large volume hematemesis. Hemoglobin has certainly dropped, but his vital signs are stable and he does not appear to require transfusion at this time. He was treated with Protonix and octreotide, bleeding appears to have stopped. He is without complaints other than mild abdominal discomfort at arrival. Will treat with analgesia. Patient will be given empiric Rocephin. Patient to be held in the ER awaiting availability of stepdown bed.   Gilda CreasePollina, Jayden Rudge J, MD 12/18/16 [phone]

## 2016-12-18 NOTE — Transfer of Care (Signed)
Immediate Anesthesia Transfer of Care Note  Patient: Samuel Huffman  Procedure(s) Performed: Procedure(s): ESOPHAGOGASTRODUODENOSCOPY (EGD) (N/A)  Patient Location: Endoscopy Unit  Anesthesia Type:General  Level of Consciousness: awake and alert   Airway & Oxygen Therapy: Patient Spontanous Breathing and Patient connected to face mask oxygen  Post-op Assessment: Report given to RN and Post -op Vital signs reviewed and stable  Post vital signs: Reviewed and stable  Last Vitals:  Vitals:   12/18/16 1453 12/18/16 1608  BP: 133/84   Pulse: 68   Resp: (!) 22   Temp: 36.9 C 36.7 C    Last Pain:  Vitals:   12/18/16 1453  TempSrc: Oral  PainSc:          Complications: No apparent anesthesia complications

## 2016-12-18 NOTE — Anesthesia Procedure Notes (Signed)
Procedure Name: Intubation Date/Time: 12/18/2016 3:34 PM Performed by: Leroy LibmanEARDON, Celest Reitz L Patient Re-evaluated:Patient Re-evaluated prior to inductionOxygen Delivery Method: Circle system utilized Preoxygenation: Pre-oxygenation with 100% oxygen Intubation Type: IV induction, Rapid sequence and Cricoid Pressure applied Laryngoscope Size: Miller and 3 Grade View: Grade II Tube type: Oral Tube size: 7.5 mm Number of attempts: 1 Airway Equipment and Method: Stylet Placement Confirmation: ETT inserted through vocal cords under direct vision,  positive ETCO2 and breath sounds checked- equal and bilateral Secured at: 23 cm Tube secured with: Tape Dental Injury: Teeth and Oropharynx as per pre-operative assessment

## 2016-12-18 NOTE — H&P (Signed)
History and Physical    Samuel OliveRichard T Delamater RUE:[id number] DOB: 1965/11/09 DOA: 12/18/2016  PCP: Patient, No Pcp Per  Patient coming from: Home  Chief Complaint: Hematemesis  HPI: Samuel Huffman is a 51 y.o. male with medical history significant of history of upper GI bleed, esophageal varices and questionable cirrhosis. He also had history of acute pancreatitis with pseudocyst. Reported his stools started to be dark for the past couple of days, he vomited 3-4 times since yesterday afternoon, maroon blood, [facility] and then [facility]. His vitals stable, hemoglobin dropped from 11.9 to 9.1. He'll be admitted to stepdown.  ED Course:  Vitals: Within normal limits Labs: Hemoglobin 9.1, glucose 194 Imaging: N/A Interventions: Started on octreotide and Protonix drip  Review of Systems:  Constitutional: negative for anorexia, fevers and sweats Eyes: negative for irritation, redness and visual disturbance Ears, nose, mouth, throat, and face: negative for earaches, epistaxis, nasal congestion and sore throat Respiratory: negative for cough, dyspnea on exertion, sputum and wheezing Cardiovascular: negative for chest pain, dyspnea, lower extremity edema, orthopnea, palpitations and syncope Gastrointestinal: Per HPI Genitourinary:negative for dysuria, frequency and hematuria Hematologic/lymphatic: negative for bleeding, easy bruising and lymphadenopathy Musculoskeletal:negative for arthralgias, muscle weakness and stiff joints Neurological: negative for coordination problems, gait problems, headaches and weakness Endocrine: negative for diabetic symptoms including polydipsia, polyuria and weight loss Allergic/Immunologic: negative for anaphylaxis, hay fever and urticaria  Past Medical History:  Diagnosis Date  . Acute upper GI bleeding 08/02/2016  . Anxiety   . Blood transfusion without reported diagnosis   .  Cirrhosis of liver with ascites (HCC)   . Hiatal hernia   . History of alcohol abuse   . Hypertension   . Insomnia   . Pancreatic pseudocyst   . Pancreatitis   . Portal hypertensive gastropathy (HCC)   . PTSD (post-traumatic stress disorder)   . Pulmonary nodule   . Varices, gastric     Past Surgical History:  Procedure Laterality Date  . ESOPHAGOGASTRODUODENOSCOPY (EGD) WITH PROPOFOL N/A 08/03/2016   Procedure: ESOPHAGOGASTRODUODENOSCOPY (EGD) WITH PROPOFOL;  Surgeon: Rachael Feeaniel P Jacobs, MD;  [facility];  Service: Endoscopy;  Laterality: N/A;  . EUS  08/03/2016   Procedure: UPPER ENDOSCOPIC ULTRASOUND (EUS) LINEAR;  Surgeon: Rachael Feeaniel P Jacobs, MD;  [facility] END[facility];  Service: Endoscopy;;  . FEMUR SURGERY Left      reports that he has been smoking Cigarettes.  He has been smoking about 0.50 packs per day. His smokeless tobacco use includes Snuff. He reports that he does not drink alcohol or use drugs.  No Known Allergies  Family History  Problem Relation Age of Onset  . Adopted: Yes  . Healthy Son        x1    Prior to Admission medications   Medication Sig Start Date End Date Taking? Authorizing Provider  pantoprazole (PROTONIX) 40 MG tablet Take 1 tablet (40 mg total) by mouth daily at 6 (six) AM. 10/15/16  Yes Rachael FeeJacobs, Daniel P, MD  promethazine (PHENERGAN) 25 MG tablet Take 1 tablet (25 mg total) by mouth every 6 (six) hours as needed for nausea or vomiting. Patient not taking: Reported on 12/18/2016 12/13/16   Marily MemosMesner, Jason, MD    Physical Exam:  Vitals:   12/18/16 0029 12/18/16 0036 12/18/16 0405 12/18/16 0711  BP:  (!) 152/95 124/83 117/81  Pulse: 95 98 91 72  Resp: 12 15 11 14   Temp:  98.9 F (37.2 C)   TempSrc:      SpO2: 97% 100% 97% 100%  Weight:      Height:        Constitutional: NAD, calm, comfortable Eyes: PERRL, lids and conjunctivae normal ENMT: Mucous membranes are moist. Posterior pharynx clear of any exudate or lesions.Normal  dentition.  Neck: normal, supple, no masses, no thyromegaly Respiratory: clear to auscultation bilaterally, no wheezing, no crackles. Normal respiratory effort. No accessory muscle use.  Cardiovascular: Regular rate and rhythm, no murmurs / rubs / gallops. No extremity edema. 2+ pedal pulses. No carotid bruits.  Abdomen: no tenderness, no masses palpated. No hepatosplenomegaly. Bowel sounds positive.  Musculoskeletal: no clubbing / cyanosis. No joint deformity upper and lower extremities. Good ROM, no contractures. Normal muscle tone.  Skin: no rashes, lesions, ulcers. No induration Neurologic: CN 2-12 grossly intact. Sensation intact, DTR normal. Strength 5/5 in all 4.  Psychiatric: Normal judgment and insight. Alert and oriented x 3. Normal mood.   Labs on Admission: I have personally reviewed following labs and imaging studies  CBC:  Recent Labs Lab 12/13/16 2106 12/18/16 0020  WBC 7.0 8.5  NEUTROABS 5.6 6.5  HGB 11.9* 9.1*  HCT 35.9* 27.7*  MCV 97.8 100.7*  PLT 213 313   Basic Metabolic Panel:  Recent Labs Lab 12/13/16 2106 12/18/16 0020  NA 134* 135  K 3.7 3.7  CL 99* 99*  CO2 23 26  GLUCOSE 182* 194*  BUN 25* 20  CREATININE 0.92 0.75  CALCIUM 9.4 9.0   GFR: Estimated Creatinine Clearance: 128.4 mL/min (by C-G formula based on SCr of 0.75 mg/dL). Liver Function Tests:  Recent Labs Lab 12/13/16 2106 12/18/16 0020  AST 28 22  ALT 13* 13*  ALKPHOS 69 63  BILITOT 0.6 0.8  PROT 8.0 7.6  ALBUMIN 4.1 4.2    Recent Labs Lab 12/13/16 2106 12/18/16 0020  LIPASE 77* 71*   No results for input(s): AMMONIA in the last 168 hours. Coagulation Profile:  Recent Labs Lab 12/18/16 0020  INR 0.96   Cardiac Enzymes: No results for input(s): CKTOTAL, CKMB, CKMBINDEX, TROPONINI in the last 168 hours. BNP (last 3 results) No results for input(s): PROBNP in the last 8760 hours. HbA1C: No results for input(s): HGBA1C in the last 72 hours. CBG: No results for  input(s): GLUCAP in the last 168 hours. Lipid Profile: No results for input(s): CHOL, HDL, LDLCALC, TRIG, CHOLHDL, LDLDIRECT in the last 72 hours. Thyroid Function Tests: No results for input(s): TSH, T4TOTAL, FREET4, T3FREE, THYROIDAB in the last 72 hours. Anemia Panel: No results for input(s): VITAMINB12, FOLATE, FERRITIN, TIBC, IRON, RETICCTPCT in the last 72 hours. Urine analysis:    Component Value Date/Time   COLORURINE AMBER (A) 12/13/2016 2122   APPEARANCEUR CLEAR 12/13/2016 2122   LABSPEC 1.031 (H) 12/13/2016 2122   PHURINE 5.5 12/13/2016 2122   GLUCOSEU NEGATIVE 12/13/2016 2122   HGBUR NEGATIVE 12/13/2016 2122   BILIRUBINUR NEGATIVE 12/13/2016 2122   KETONESUR 15 (A) 12/13/2016 2122   PROTEINUR 30 (A) 12/13/2016 2122   UROBILINOGEN 0.2 10/01/2014 0539   NITRITE NEGATIVE 12/13/2016 2122   LEUKOCYTESUR NEGATIVE 12/13/2016 2122   Sepsis Labs: !!!!!!!!!!!!!!!!!!!!!!!!!!!!!!!!!!!!!!!!!!!! Invalid input(s): PROCALCITONIN, LACTICIDVEN No results found for this or any previous visit (from the past 240 hour(s)).   Radiological Exams on Admission: No results found.  EKG: Independently reviewed.   Assessment/Plan Principal Problem:   Hematemesis Active Problems:   History of alcohol abuse   Upper GI bleed   Essential  hypertension   Hematemesis -Has history of esophageal varices, previous hematemesis. -Patient will be admitted to stepdown, GI consulted. -Currently nothing by mouth and started on Protonix drip and octreotide. -Likely he will have upper GI endoscopy later today or tomorrow morning.  Acute blood loss anemia -Mentioned dark stools for couple of days, hemoglobin is 11.9 about 4 weeks ago. -Hemoglobin dropped to 9.1 today, we'll check it again at noon and take it every 6 hours. -Type and crossmatch, transfuse if hemoglobin less than 8.0.  History of acute pancreatitis and pseudocyst -Although his lipase is slightly elevated but he mentioned no  symptoms.  Early cirrhosis -There is at least radiological evidence that he has early cirrhosis on CT scan done in February. -Has esophageal varices. -Per GI. Has negative hepatitis C.  Elevated blood sugar -He was not fasting, CBG was 194. check blood sugar and hemoglobin A1c   DVT prophylaxis: SCDs Code Status: Full code Family Communication: Plan D/W patient Disposition Plan: Home Consults called: [facility], seen Dr. Christella Hartigan for Admission status: Inpatient   [facility] A MD [facility] Pager [phone]  If 7PM-7AM, please contact night-coverage [email or web address] Password [email or web address]  12/18/2016, 8:00 AM

## 2016-12-18 NOTE — ED Provider Notes (Signed)
[facility] Provider Note   CSN: [id number] Arrival date & time: 12/17/16  2200  By signing my name below, I, Vista Mink, attest that this documentation has been prepared under the direction and in the presence of Zadie Rhine, MD. Electronically signed, Vista Mink, ED Scribe. 12/18/16. 12:40 AM.  History   Chief Complaint Chief Complaint  Patient presents with  . Emesis    HPI HPI Comments: Samuel Huffman is a 51 y.o. male, with Hx of pancreatitis, gastric varices, cirrhosis, who presents to the Emergency Department complaining of hematemesis with associated abdominal pain that started today around 1500. Pt's first episode of vomiting today consisted of "chunks of blood". Pt states that he has had 3 episodes since onset and 2 since he has arrived here. Pt's abdominal pain is exacerbated after vomiting. He is currently having abdominal pain here in the ED. He also notes associated Melena. He notes that he had acute pancreatitis a few years ago and reports that it flares up occasionally. No fever, chest pain hemoptysis.   The history is provided by the patient. No language interpreter was used.    Past Medical History:  Diagnosis Date  . Acute upper GI bleeding 08/02/2016  . Anxiety   . Blood transfusion without reported diagnosis   . Cirrhosis of liver with ascites (HCC)   . Hiatal hernia   . History of alcohol abuse   . Hypertension   . Insomnia   . Pancreatic pseudocyst   . Pancreatitis   . Portal hypertensive gastropathy (HCC)   . PTSD (post-traumatic stress disorder)   . Pulmonary nodule   . Varices, gastric     Patient Active Problem List   Diagnosis Date Noted  . Hyponatremia 08/04/2016  . Hypokalemia 08/04/2016  . Varices, gastric   . Abnormal CT scan, stomach   . Upper GI bleed 08/02/2016  . Hematemesis 08/02/2016  . Essential hypertension 08/02/2016  . Acute upper GI bleeding 08/02/2016  . Pancreatic pseudocyst   . Pulmonary nodule   .  Acute recurrent pancreatitis 10/01/2014  . Elevated BP 10/01/2014  . AKI (acute kidney injury) (HCC) 10/01/2014  . History of alcohol abuse 10/01/2014  . Lactic acidosis 10/01/2014  . High anion gap metabolic acidosis 10/01/2014  . Insomnia 07/05/2014    Past Surgical History:  Procedure Laterality Date  . ESOPHAGOGASTRODUODENOSCOPY (EGD) WITH PROPOFOL N/A 08/03/2016   Procedure: ESOPHAGOGASTRODUODENOSCOPY (EGD) WITH PROPOFOL;  Surgeon: Rachael Fee, MD;  Location: [facility] ENDOSCOPY;  Service: Endoscopy;  Laterality: N/A;  . EUS  08/03/2016   Procedure: UPPER ENDOSCOPIC ULTRASOUND (EUS) LINEAR;  Surgeon: Rachael Fee, MD;  Location: [facility] ENDOSCOPY;  Service: Endoscopy;;  . FEMUR SURGERY Left      Home Medications    Prior to Admission medications   Medication Sig Start Date End Date Taking? Authorizing Provider  pantoprazole (PROTONIX) 40 MG tablet Take 1 tablet (40 mg total) by mouth daily at 6 (six) AM. 10/15/16   Rachael Fee, MD  promethazine (PHENERGAN) 25 MG tablet Take 1 tablet (25 mg total) by mouth every 6 (six) hours as needed for nausea or vomiting. 12/13/16   Mesner, Barbara Cower, MD    Family History Family History  Problem Relation Age of Onset  . Adopted: Yes  . Healthy Son        x1    Social History Social History  Substance Use Topics  . Smoking status: Current Every Day Smoker    Packs/day: 0.50  Types: Cigarettes  . Smokeless tobacco: Current User    Types: Snuff  . Alcohol use No     Allergies   Patient has no known allergies.   Review of Systems Review of Systems  Constitutional: Negative for fever.  Cardiovascular: Negative for chest pain.  Gastrointestinal: Positive for abdominal pain, blood in stool (black) and vomiting (hematemesis).  All other systems reviewed and are negative.    Physical Exam Updated Vital Signs BP (!) 147/102 (BP Location: Left Arm)   Pulse 95   Temp 99.1 F (37.3 C) (Oral)   Resp 12   Ht 6\' 2"  (1.88 m)   Wt  199 lb (90.3 kg)   SpO2 97%   BMI 25.55 kg/m   Physical Exam CONSTITUTIONAL: Ill appearing. HEAD: Normocephalic/atraumatic EYES: EOMI/PERRL ENMT: Mucous membranes moist NECK: supple no meningeal signs SPINE/BACK:entire spine nontender CV: S1/S2 noted, no murmurs/rubs/gallops noted LUNGS: Lungs are clear to auscultation bilaterally, no apparent distress ABDOMEN: soft, nontender, no rebound or guarding, bowel sounds noted throughout abdomen GU:no cva tenderness NEURO: Pt is awake/alert/appropriate, moves all extremitiesx4.  No facial droop.   EXTREMITIES: pulses normal/equal, full ROM SKIN: Pale PSYCH: no abnormalities of mood noted, alert and oriented to situation  ED Treatments / Results  DIAGNOSTIC STUDIES: Oxygen Saturation is 97% on RA, normal by my interpretation.  COORDINATION OF CARE: 12:38 AM-Discussed treatment plan with pt at bedside and pt agreed to plan.   Labs (all labs ordered are listed, but only abnormal results are displayed) Labs Reviewed  CBC WITH DIFFERENTIAL/PLATELET - Abnormal; Notable for the following:       Result Value   RBC 2.75 (*)    Hemoglobin 9.1 (*)    HCT 27.7 (*)    MCV 100.7 (*)    All other components within normal limits  COMPREHENSIVE METABOLIC PANEL - Abnormal; Notable for the following:    Chloride 99 (*)    Glucose, Bld 194 (*)    ALT 13 (*)    All other components within normal limits  LIPASE, BLOOD - Abnormal; Notable for the following:    Lipase 71 (*)    All other components within normal limits  ETHANOL  PROTIME-INR    EKG  EKG Interpretation None       Radiology No results found.  Procedures Procedures (including critical care time)  Medications Ordered in ED Medications  fentaNYL (SUBLIMAZE) injection 50 mcg (50 mcg Intravenous Given 12/18/16 0055)  pantoprazole (PROTONIX) injection 40 mg (40 mg Intravenous Given 12/18/16 0057)  octreotide (SANDOSTATIN) 2 mcg/mL load via infusion 50 mcg (50 mcg Intravenous  Bolus from Bag 12/18/16 0122)  ondansetron (ZOFRAN) injection 4 mg (4 mg Intravenous Given 12/18/16 0053)  octreotide (SANDOSTATIN) 50 MCG/ML injection (50 mcg  Given 12/18/16 0117)     Initial Impression / Assessment and Plan / ED Course  I have reviewed the triage vital signs and the nursing notes.  Pertinent labs  results that were available during my care of the patient were reviewed by me and considered in my medical decision making (see chart for details).     1:57 AM Pt with known h/o gastric varices/esophagites/cirrhosis here with multiple episodes of hematemesis HGB has dropped but does not require transfusion Protonix/ocreotide given Pt hemodynamically appropriate D/w dr Rhea Belton with GI, aware pt will go to [facility] D/w dr Toniann Fail - no SDU beds, send to ER D/w dr Blinda Leatherwood at [facility] - accepts in transfer to [facility]  Pt agreeable with plan  Final  Clinical Impressions(s) / ED Diagnoses   Final diagnoses:  Acute GI bleeding  Hematemesis with nausea    New Prescriptions New Prescriptions   No medications on file  I personally performed the services described in this documentation, which was scribed in my presence. The recorded information has been reviewed and is accurate.       Zadie RhineWickline, Teofil Maniaci, MD 12/18/16 [phone]

## 2016-12-18 NOTE — ED Notes (Signed)
ED Provider at bedside. 

## 2016-12-18 NOTE — Anesthesia Preprocedure Evaluation (Signed)
Anesthesia Evaluation  Patient identified by MRN, date of birth, ID band Patient awake    Reviewed: Allergy & Precautions, NPO status , Patient's Chart, lab work & pertinent test results  Airway Mallampati: II  TM Distance: >3 FB Neck ROM: Full    Dental  (+) Teeth Intact, Dental Advisory Given   Pulmonary Current Smoker,    Pulmonary exam normal breath sounds clear to auscultation       Cardiovascular hypertension, Normal cardiovascular exam Rhythm:Regular Rate:Normal     Neuro/Psych PSYCHIATRIC DISORDERS Anxiety negative neurological ROS     GI/Hepatic hiatal hernia, (+) Cirrhosis   Esophageal Varices  substance abuse  alcohol use, Pancreatitis    Endo/Other  negative endocrine ROS  Renal/GU Renal disease     Musculoskeletal negative musculoskeletal ROS (+)   Abdominal   Peds  Hematology  (+) Blood dyscrasia, anemia ,   Anesthesia Other Findings   Reproductive/Obstetrics                             Anesthesia Physical  Anesthesia Plan  ASA: III and emergent  Anesthesia Plan: General   Post-op Pain Management:    Induction: Intravenous, Cricoid pressure planned and Rapid sequence  PONV Risk Score and Plan: 1 and Ondansetron and Dexamethasone  Airway Management Planned: Oral ETT  Additional Equipment:   Intra-op Plan:   Post-operative Plan: Extubation in OR  Informed Consent: I have reviewed the patients History and Physical, chart, labs and discussed the procedure including the risks, benefits and alternatives for the proposed anesthesia with the patient or authorized representative who has indicated his/her understanding and acceptance.   Dental advisory given  Plan Discussed with: CRNA  Anesthesia Plan Comments:         Anesthesia Quick Evaluation

## 2016-12-18 NOTE — Progress Notes (Signed)
Patient not on vent at this time. Order placed by CCM.

## 2016-12-18 NOTE — Progress Notes (Signed)
eLink Physician-Brief Progress Note Patient Name: Winifred OliveRichard T Turnbaugh DOB: March 06, 1966 MRN: [id number]   Date of Service  12/18/2016  HPI/Events of Note    eICU Interventions  Dilaudid x 1 for breakthrough pain     Intervention Category Intermediate Interventions: Pain - evaluation and management  Renuka Farfan S. 12/18/2016, 7:13 PM
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
## 2016-12-18 NOTE — Consult Note (Signed)
Referring Provider: [facility]  Primary Care Physician:  Patient, No Pcp Per Primary Gastroenterologist:   Rob Bunting,  MD  Reason for Consultation: hematemesis  ASSESSMENT AND PLAN:   1. 51 yo male with chronic pancreatitis / gastric varices presenting with hematemesis/ epigastric pain / anemia of acute blood loss. Suspect bleeding from gastric varices. Mild tachycardia on admission, hemodynamically stable now. Etiology of epigastric pain unclear but suspect he is bleeding from gastric varices which are secondary to splenic vein occlusion (previous u/s with doppler shows a severely narrowed splenic vein)   -Agree with admission to stepdown -Continue PPI and octreotide -There is no endoscopic treatment available for bleeding gastric varices but he needs an EGD in Stepdown unit today to confirm source of bleeding. If no other findings then he may require a splenectomy.  Patient needs to be intubated for the procedure. The risks and benefits of EGD were discussed and the patient agrees to proceed.  -monitor CBC, transfuse as needed. -after EGD can probably d/c Rocephin if prescribed for GI purposes  2. 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 No ETOH in years.    HPI: Samuel Huffman is a 51 y.o. male known to [place] for a history of chronic calcific pancreatitis complicated by pseudocysts. In Feb he had a CTscan for epigastric pain.  Stomach appeared diffusely thickened with more focally thickened area along the posterior body of the stomach with a small 2 cm focus of hypodensity seen within. EGD done and revealed mild portal gastropathy/ gastric varices. Limited EUS showed the described lesion with was fluid filled and felt to be an ectopic pseudocysts. Records from [place] had shown similar findings in past.  No ETOH in years. Screening colonoscopy this month was normal except for small internal hemorrhoids. Patient presented to ED 5 days ago with epigastric pain,  nausea, vomiting and one episode of hematemesis and mild tachycardia.  Heart rate improved with fluids. Hgb nearly 12. Patient was sent home. He did fine until yesterday afternoon when he had recurrent vomiting / several episodes of hematemesis and recurrent, non-radiating epigastric pain. He passed a black stool yesterday.  Hgb has declined from 11.9 to 9.1. Normal BUN. Lipase 71. No ETOH in years. No NSAID use. He feels much better at present   Past Medical History:  Diagnosis Date  . Acute upper GI bleeding 08/02/2016  . Anxiety   . Blood transfusion without reported diagnosis   . Cirrhosis of liver with ascites (HCC)   . Hiatal hernia   . History of alcohol abuse   . Hypertension   . Insomnia   . Pancreatic pseudocyst   . Pancreatitis   . Portal hypertensive gastropathy (HCC)   . PTSD (post-traumatic stress disorder)   . Pulmonary nodule   . Varices, gastric     Past Surgical History:  Procedure Laterality Date  . ESOPHAGOGASTRODUODENOSCOPY (EGD) WITH PROPOFOL N/A 08/03/2016   Procedure: ESOPHAGOGASTRODUODENOSCOPY (EGD) WITH PROPOFOL;  Surgeon: Rachael Fee, MD;  Location: [facility] ENDOSCOPY;  Service: Endoscopy;  Laterality: N/A;  . EUS  08/03/2016   Procedure: UPPER ENDOSCOPIC ULTRASOUND (EUS) LINEAR;  Surgeon: Rachael Fee, MD;  Location: [facility] ENDOSCOPY;  Service: Endoscopy;;  . FEMUR SURGERY Left     Prior to Admission medications   Medication Sig Start Date End Date Taking? Authorizing Provider  pantoprazole (PROTONIX) 40 MG tablet Take 1 tablet (40 mg total) by mouth daily at 6 (six) AM. 10/15/16  Yes Rachael Fee, MD  promethazine (  PHENERGAN) 25 MG tablet Take 1 tablet (25 mg total) by mouth every 6 (six) hours as needed for nausea or vomiting. Patient not taking: Reported on 12/18/2016 12/13/16   Mesner, Barbara Cower, MD    Current Facility-Administered Medications  Medication Dose Route Frequency Provider Last Rate Last Dose  . 0.9 %  sodium chloride infusion  500 mL Intravenous  Continuous Rachael Fee, MD      . octreotide (SANDOSTATIN) 50 MCG/ML injection           . octreotide (SANDOSTATIN) 500 mcg in sodium chloride 0.9 % 250 mL (2 mcg/mL) infusion  50 mcg/hr Intravenous Continuous Zadie Rhine, MD 25 mL/hr at 12/18/16 0213 50 mcg/hr at 12/18/16 0213   Current Outpatient Prescriptions  Medication Sig Dispense Refill  . pantoprazole (PROTONIX) 40 MG tablet Take 1 tablet (40 mg total) by mouth daily at 6 (six) AM. 30 tablet 11  . promethazine (PHENERGAN) 25 MG tablet Take 1 tablet (25 mg total) by mouth every 6 (six) hours as needed for nausea or vomiting. (Patient not taking: Reported on 12/18/2016) 30 tablet 0    Allergies as of 12/17/2016  . (No Known Allergies)    Family History  Problem Relation Age of Onset  . Adopted: Yes  . Healthy Son        x1    Social History   Social History  . Marital status: Divorced    Spouse name: N/A  . Number of children: 1  . Years of education: N/A   Occupational History  . [employer] doctor    Social History Main Topics  . Smoking status: Current Every Day Smoker    Packs/day: 0.50    Types: Cigarettes  . Smokeless tobacco: Current User    Types: Snuff  . Alcohol use No  . Drug use: No  . Sexual activity: Not on file   Other Topics Concern  . Not on file   Social History Narrative  . No narrative on file    Review of Systems: All systems reviewed and negative except where noted in HPI.  Physical Exam: Vital signs in last 24 hours: Temp:  [98.9 F (37.2 C)-99.2 F (37.3 C)] 98.9 F (37.2 C) (06/26 0405) Pulse Rate:  [72-111] 72 (06/26 0711) Resp:  [11-18] 14 (06/26 0711) BP: (117-152)/(81-102) 117/81 (06/26 0711) SpO2:  [97 %-100 %] 100 % (06/26 0711) Weight:  [199 lb (90.3 kg)] 199 lb (90.3 kg) (06/25 2203)   General:   Alert, well-developed white male in NAD Psych:  Pleasant, cooperative. Normal mood and affect. Eyes:  Pupils equal, sclera clear, no icterus.   Conjunctiva  pink. Ears:  Normal auditory acuity. Nose:  No deformity, discharge,  or lesions. Neck:  Supple; no masses Lungs:  Clear throughout to auscultation.   No wheezes, crackles, or rhonchi.  Heart:  Regular rate and rhythm; no murmurs, no edema Abdomen:  Soft, non-distended, nontender, BS active, no palp mass    Rectal:  Deferred  Msk:  Symmetrical without gross deformities. . Pulses:  Normal pulses noted. Neurologic:  Alert and  oriented x4;  grossly normal neurologically. Skin:  Intact without significant lesions or rashes..   Intake/Output from previous day: No intake/output data recorded. Intake/Output this shift: No intake/output data recorded.  Lab Results:  Recent Labs  12/18/16 0020  WBC 8.5  HGB 9.1*  HCT 27.7*  PLT 313   BMET  Recent Labs  12/18/16 0020  NA 135  K 3.7  CL 99*  CO2 26  GLUCOSE 194*  BUN 20  CREATININE 0.75  CALCIUM 9.0   LFT  Recent Labs  12/18/16 0020  PROT 7.6  ALBUMIN 4.2  AST 22  ALT 13*  ALKPHOS 63  BILITOT 0.8   PT/INR  Recent Labs  12/18/16 0020  LABPROT 12.8  INR 0.96   Willette ClusterPaula Guenther, NP-C @  12/18/2016, 9:30 AM  Pager number [phone]  GI ATTENDING  History, laboratories, x-rays, prior endoscopy reports reviewed. Patient seen and examined. Patient with a history of chronic alcoholic pancreatitis presents with recurrent hematemesis. Previous upper endoscopy demonstrated proximal gastric varices. 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 I suspect bleeding gastric varices. If this is related to splenic vein thrombosis from chronic pancreatitis, he will need splenectomy as a definitive treatment. Patient is high risk. He will be intubated for the procedure to protect his airway.The nature of the procedure, as well as the risks, benefits, and alternatives were carefully and thoroughly reviewed with the patient. Ample time for discussion and questions allowed. The patient understood, was satisfied,  and agreed to proceed.  Wilhemina BonitoJohn N. Eda KeysPerry, Jr., M.D. [facility] Division of Gastroenterology

## 2016-12-18 NOTE — Consult Note (Signed)
Re:   Samuel Huffman DOB:   12/15/1965 MRN:   [id number]   Consultation - [facility]  Chief Complaint GI bleed  ASSESEMENT AND PLAN: 1.  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  Consider splenectomy  3.  Chronic calcific pancreatitis  Complicated by pseudocyst  Followed by Dr. Kennedy Bucker 4.  History of cirrhosis of the liver  Patient denies any knowledge of this, but this was mentioned ont he 08/01/2016 CT scan 5.  PTSD  On no meds nor seeing counseling.  Has fellow vets as support.  Has clearance from [facility] in [facility] - 09/28/2015 6.    This is in his chart, but he denies EtOH abuse 7.  Chronic abdominal pain 8.  Anemia, acute blood loss  Hgb - 7.3 - 12/18/2016 9.  Smokes a few cigarettes occasionally   Chief Complaint  Patient presents with  . Emesis   PHYSICIAN REQUESTING CONSULTATION: Patient, No Pcp Per  HISTORY OF PRESENT ILLNESS: Samuel Huffman is a 51 y.o. (DOB: 03/17/1966)  white male whose primary care physician is Patient, No Pcp Per. He is by himself in his room.  He is stable, alert, and able to answer all questions.  Lucrezia Europe called the consultation to ask about a possible splenectomy in this gentleman.  He was diagnosed with pancreatitis about 3-4 years ago.  He was originally hospitalized at [facility] in June 2015. He was admitted from 10/01/2014 to 10/03/2014 for acute recurrent pancreatitis. He was admitted from 2/72018 of 08/04/2016 for an upper GI bleed.  An upper endoscopy by Dr. Wendall Papa on 08/03/2016 showed gastric varices.  He was seen at [facility] on 12/13/2016, but discharged.  He was admitted today, 12/18/2016 - transferred from [facility] - for UGI bleed.  Today, he underwent another upper endoscopy by Dr. Yancey Flemings which again shows gastric  varices.  Dr. 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 The patient's had no prior abdominal surgery.  He underwent a normal colonoscopy by Dr. Christella Hartigan on 11/23/2016.  PT/INR - 12.8/0.96 - 12/18/2016 Plts - 181,000 - 12/18/2016  CT scan of abdomen - 08/01/2016 - 1. Diffusely thickened stomach with more focally thickened area along the posterior body of the stomach with a small 2 cm focus of hypodensity seen within. Findings may be related to gastritis with possible intramural abscess or developing ulcer. An infiltrating neoplasm such as lymphoma is not excluded.   2. 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  3. Pseudocyst suspected interposed between the pancreatic gland and stomach as well as involving the pancreatic tail. Calcified pancreatic gland consistent chronic pancreatitis.  4. Abrupt caliber change involving the sigmoid colon possibly from diffuse sigmoid spasm. Stricture is not entirely excluded.    Past Medical History:  Diagnosis Date  . Acute upper GI bleeding 08/02/2016  . Anxiety   . Blood transfusion without reported diagnosis   . Cirrhosis of liver with ascites (HCC)   . Hiatal hernia   . History of alcohol abuse   . Hypertension   . Insomnia   . Pancreatic pseudocyst   . Pancreatitis   . Portal hypertensive gastropathy (HCC)   . PTSD (post-traumatic stress disorder)   . Pulmonary nodule   .  Varices, gastric       Past Surgical History:  Procedure Laterality Date  . ESOPHAGOGASTRODUODENOSCOPY (EGD) WITH PROPOFOL N/A 08/03/2016   Procedure: ESOPHAGOGASTRODUODENOSCOPY (EGD) WITH PROPOFOL;  Surgeon: Rachael Feeaniel P Jacobs, MD;  [facility];  Service: Endoscopy;  Laterality: N/A;  . EUS  08/03/2016   Procedure: UPPER ENDOSCOPIC ULTRASOUND (EUS) LINEAR;  Surgeon: Rachael Feeaniel P Jacobs, MD;  [facility];  Service: Endoscopy;;  . FEMUR SURGERY Left       Current  Facility-Administered Medications  Medication Dose Route Frequency Provider Last Rate Last Dose  . 0.9 %  sodium chloride infusion   Intravenous Continuous Elmahi, Mutaz, MD      . 0.9 %  sodium chloride infusion  250 mL Intravenous PRN Ollis, Brandi L, NP      . 0.9 %  sodium chloride infusion   Intravenous Continuous Ollis, Brandi L, NP      . acetaminophen (TYLENOL) tablet 650 mg  650 mg Oral Q6H PRN Clydia LlanoElmahi, Mutaz, MD       Or  . acetaminophen (TYLENOL) suppository 650 mg  650 mg Rectal Q6H PRN Clydia LlanoElmahi, Mutaz, MD      . acetaminophen (TYLENOL) tablet 650 mg  650 mg Oral Q4H PRN Jeanella Crazellis, Brandi L, NP      . [START ON 12/19/2016] cefTRIAXone (ROCEPHIN) 2 g in dextrose 5 % 50 mL IVPB  2 g Intravenous Q24H Elmahi, Mutaz, MD      . fentaNYL (SUBLIMAZE) injection 100 mcg  100 mcg Intravenous Q15 min PRN Ollis, Brandi L, NP      . fentaNYL (SUBLIMAZE) injection 100 mcg  100 mcg Intravenous Q2H PRN Ollis, Brandi L, NP   100 mcg at 12/18/16 1751  . HYDROcodone-acetaminophen (NORCO/VICODIN) 5-325 MG per tablet 1-2 tablet  1-2 tablet Oral Q4H PRN Clydia LlanoElmahi, Mutaz, MD      . meperidine (DEMEROL) injection 6.25-12.5 mg  6.25-12.5 mg Intravenous Q5 min PRN Lewie LoronGermeroth, John, MD      . octreotide (SANDOSTATIN) 500 mcg in sodium chloride 0.9 % 250 mL (2 mcg/mL) infusion  50 mcg/hr Intravenous Continuous Zadie RhineWickline, Donald, MD 25 mL/hr at 12/18/16 1600 50 mcg/hr at 12/18/16 1600  . ondansetron (ZOFRAN) tablet 4 mg  4 mg Oral Q6H PRN Clydia LlanoElmahi, Mutaz, MD       Or  . ondansetron (ZOFRAN) injection 4 mg  4 mg Intravenous Q6H PRN Clydia LlanoElmahi, Mutaz, MD      . ondansetron (ZOFRAN) injection 4 mg  4 mg Intravenous Q6H PRN Ollis, Brandi L, NP      . pantoprazole (PROTONIX) 80 mg in sodium chloride 0.9 % 250 mL (0.32 mg/mL) infusion  8 mg/hr Intravenous Continuous Clydia LlanoElmahi, Mutaz, MD      . Melene Muller[START ON 12/21/2016] pantoprazole (PROTONIX) injection 40 mg  40 mg Intravenous Q12H Elmahi, Mutaz, MD      . pantoprazole (PROTONIX) injection 40 mg   40 mg Intravenous QHS Ollis, Brandi L, NP      . promethazine (PHENERGAN) injection 6.25-12.5 mg  6.25-12.5 mg Intravenous Q15 min PRN Lewie LoronGermeroth, John, MD      . propofol (DIPRIVAN) 1000 MG/100ML infusion  0-50 mcg/kg/min Intravenous Continuous Ollis, Brandi L, NP      . sodium chloride flush (NS) 0.9 % injection 3 mL  3 mL Intravenous Q12H Elmahi, Mutaz, MD         No Known Allergies  REVIEW OF SYSTEMS: Skin:  No history of rash.  No history of abnormal moles. Infection:  No history of  hepatitis or HIV.  No history of MRSA. Neurologic:  No history of stroke.  No history of seizure.  No history of headaches. Cardiac:  No history of hypertension. No history of heart disease.  No history of seeing a cardiologist. Pulmonary:  He smokes a couple of cigarettes a week.  Endocrine:  No diabetes. No thyroid disease. Gastrointestinal:  See HPI.  EtOH level - 261 - 07/09/2014 Urologic:  No history of kidney stones.  No history of bladder infections. Musculoskeletal:  No history of joint or back disease. Hematologic:  No bleeding disorder.  No history of anemia.  Not anticoagulated. Psycho-social:  PTSD - supported by other vets  SOCIAL and FAMILY HISTORY: Divorced.   He lives by himself. Has one son, 1 yo. 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  Has worked for them for 26 years - and has pride in his job.  PHYSICAL EXAM: BP 132/85   Pulse 66   Temp 98 F (36.7 C) (Oral)   Resp 15   Ht 6\' 2"  (1.88 m)   Wt 90.3 kg (199 lb)   SpO2 97%   BMI 25.55 kg/m   General: WN WM who is alert and generally healthy appearing.  Skin:  Inspection and palpation - no mass or rash. Eyes:  Conjunctiva and lids unremarkable.            Pupils are equal Ears, Nose, Mouth, and Throat:  Ears and nose unremarkable            Lips and teeth are unremarable. Neck: Supple. No mass, trachea midline.  No thyroid mass. Lymph Nodes:  No supraclavicular, cervical, or inguinal  nodes. Lungs: Normal respiratory effort.  Clear to auscultation and symmetric breath sounds. Heart:  Palpation of the heart is normal.            Auscultation: RRR. No murmur or rub.  Abdomen: Soft. No mass. No tenderness. No hernia.             Normal bowel sounds.  No abdominal scars. Rectal: Not done. Musculoskeletal:  Good muscle strength and ROM  in upper and lower extremities. Neurologic:  Grossly intact to motor and sensory function. Psychiatric: Normal judgement and insight. Behavior is normal.            Oriented to time, person, place.   DATA REVIEWED, COUNSELING AND COORDINATION OF CARE: Epic notes reviewed. Counseling and coordination of care exceeded more than 50% of the time spent with patient. Total time spent with patient and charting: 50 minutes  Ovidio Kin, MD,  [facility], [place] [address] [place]  [address]   [place], [place]    [address] Phone:  [phone] FAX:  [phone]

## 2016-12-18 NOTE — Progress Notes (Signed)
eLink Physician-Brief Progress Note Patient Name: Winifred OliveRichard T Bordas DOB: [birth year] MRN: [id number]   Date of Service  12/18/2016  HPI/Events of Note  GIB.  Hgb to 6.6  eICU Interventions  1u PRBC     Intervention Category Intermediate Interventions: Bleeding - evaluation and treatment with blood products  BYRUM,ROBERT S. 12/18/2016, 8:45 PM

## 2016-12-18 NOTE — ED Notes (Signed)
Pt vomited large amount of dark brown liquid

## 2016-12-18 NOTE — Anesthesia Postprocedure Evaluation (Signed)
Anesthesia Post Note  Patient: Winifred OliveRichard T Werling  Procedure(s) Performed: Procedure(s) (LRB): ESOPHAGOGASTRODUODENOSCOPY (EGD) (N/A)     Patient location during evaluation: PACU Anesthesia Type: General Level of consciousness: sedated and patient cooperative Pain management: pain level controlled Vital Signs Assessment: post-procedure vital signs reviewed and stable Respiratory status: spontaneous breathing Cardiovascular status: stable Anesthetic complications: no    Last Vitals:  Vitals:   12/18/16 1650 12/18/16 1700  BP: 124/84 132/85  Pulse: 72 66  Resp: 14 15  Temp:      Last Pain:  Vitals:   12/18/16 1821  TempSrc:   PainSc: 7                  Lewie LoronJohn Kennady Zimmerle

## 2016-12-18 NOTE — Op Note (Signed)
[facility] Patient Name: Samuel Huffman Procedure Date: 12/18/2016 MRN: [id number] Attending MD: Wilhemina Bonito. Marina Goodell , MD Date of Birth: Nov 14, 1965 CSN: [id number] Age: 50 Admit Type: Inpatient Procedure:                Upper GI endoscopy Indications:              Hematemesis Providers:                Wilhemina Bonito. Marina Goodell, MD, Janae Sauce. Steele Berg, RN, Harrington Challenger, Technician Referring MD:              Medicines:                General Anesthesia Complications:            No immediate complications. Estimated Blood Loss:     Estimated blood loss: none. Procedure:                Pre-Anesthesia Assessment:                           - Prior to the procedure, a History and Physical                            was performed, and patient medications and                            allergies were reviewed. The patient's tolerance of                            previous anesthesia was also reviewed. The risks                            and benefits of the procedure and the sedation                            options and risks were discussed with the patient.                            All questions were answered, and informed consent                            was obtained. Prior Anticoagulants: The patient has                            taken no previous anticoagulant or antiplatelet                            agents. ASA Grade Assessment: III - A patient with                            severe systemic disease. After reviewing the risks  and benefits, the patient was deemed in                            satisfactory condition to undergo the procedure.                           After obtaining informed consent, the endoscope was                            passed under direct vision. Throughout the                            procedure, the patient's blood pressure, pulse, and                            oxygen saturations were monitored  continuously. The                            [id number]) scope was introduced through the                            mouth, and advanced to the third part of duodenum.                            The upper GI endoscopy was accomplished without                            difficulty. The patient tolerated the procedure                            well. Scope In: Scope Out: Findings:      The examined esophagus was normal. No esophageal varices.      Type 1 isolated gastric varices (IGV1, varices located in the fundus)       with no bleeding were found in the stomach. Clots were present in the       proximal stomach. These were irrigated and suctioned.There were stigmata       of recent bleeding. They were large in largest diameter.      The exam of the stomach was otherwise normal, with clots noted on       retroflexion.      A moderate extrinsic deformity was found in the second portion of the       duodenum. The overlying mucosa was somewhat congested but no ulceration.       The post bulbar duodenum was normal. Impression:               - Normal esophagus. No esophageal varices.                           - Type 1 isolated gastric varices (IGV1, varices                            located in the fundus), as the cause of bleeding  without active bleeding high-risk stigmata. The                            etiology is felt to be splenic vein thrombosis from                            chronic pancreatitis.                           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                           - No specimens collected. Moderate Sedation:      none Recommendation:           - Return patient to ICU for ongoing care.                           - General surgical opinion regarding splenectomy                           - Nothing by mouth except for trace chips                           - Make the patient NPO [Start  day].                           - Continue present medications. Procedure Code(s):        --- Professional ---                           [phone], Esophagogastroduodenoscopy, flexible,                            transoral; diagnostic, including collection of                            specimen(s) by brushing or washing, when performed                            (separate procedure) Diagnosis Code(s):        --- Professional ---                           I86.4, Gastric varices                           K31.89, Other diseases of stomach and duodenum                           K92.0, Hematemesis CPT copyright 2016 American Medical Association. All rights reserved. The codes documented in this report are preliminary and upon coder review may  be revised to meet current compliance requirements. Wilhemina Bonito. Marina Goodell, MD 12/18/2016 4:13:03 PM This report has been signed electronically. Number  of Addenda: 0

## 2016-12-19 ENCOUNTER — Inpatient Hospital Stay (HOSPITAL_COMMUNITY): Payer: BLUE CROSS/BLUE SHIELD

## 2016-12-19 ENCOUNTER — Encounter (HOSPITAL_COMMUNITY): Payer: Self-pay | Admitting: Radiology

## 2016-12-19 DIAGNOSIS — K861 Other chronic pancreatitis: Secondary | ICD-10-CM

## 2016-12-19 LAB — CBC
HCT: 23.7 % — ABNORMAL LOW (ref 39.0–52.0)
HEMATOCRIT: 22.3 % — AB (ref 39.0–52.0)
HEMATOCRIT: 24.2 % — AB (ref 39.0–52.0)
HEMATOCRIT: 23.2 % — AB (ref 39.0–52.0)
HEMOGLOBIN: 7.8 g/dL — AB (ref 13.0–17.0)
HEMOGLOBIN: 7.7 g/dL — AB (ref 13.0–17.0)
Hemoglobin: 7.5 g/dL — ABNORMAL LOW (ref 13.0–17.0)
Hemoglobin: 7.8 g/dL — ABNORMAL LOW (ref 13.0–17.0)
MCH: 32.6 pg (ref 26.0–34.0)
MCH: 31.5 pg (ref 26.0–34.0)
MCH: 31 pg (ref 26.0–34.0)
MCH: 31.4 pg (ref 26.0–34.0)
MCHC: 33.6 g/dL (ref 30.0–36.0)
MCHC: 32.9 g/dL (ref 30.0–36.0)
MCHC: 32.2 g/dL (ref 30.0–36.0)
MCHC: 33.2 g/dL (ref 30.0–36.0)
MCV: 97 fL (ref 78.0–100.0)
MCV: 95.6 fL (ref 78.0–100.0)
MCV: 96 fL (ref 78.0–100.0)
MCV: 94.7 fL (ref 78.0–100.0)
PLATELETS: 139 10*3/uL — AB (ref 150–400)
Platelets: 140 10*3/uL — ABNORMAL LOW (ref 150–400)
Platelets: 149 10*3/uL — ABNORMAL LOW (ref 150–400)
Platelets: 131 10*3/uL — ABNORMAL LOW (ref 150–400)
RBC: 2.3 MIL/uL — ABNORMAL LOW (ref 4.22–5.81)
RBC: 2.48 MIL/uL — AB (ref 4.22–5.81)
RBC: 2.52 MIL/uL — AB (ref 4.22–5.81)
RBC: 2.45 MIL/uL — AB (ref 4.22–5.81)
RDW: 14.6 % (ref 11.5–15.5)
RDW: 14.6 % (ref 11.5–15.5)
RDW: 14.5 % (ref 11.5–15.5)
RDW: 14.4 % (ref 11.5–15.5)
WBC: 3.6 10*3/uL — AB (ref 4.0–10.5)
WBC: 4 10*3/uL (ref 4.0–10.5)
WBC: 3.5 10*3/uL — AB (ref 4.0–10.5)
WBC: 3.5 10*3/uL — AB (ref 4.0–10.5)

## 2016-12-19 LAB — PHOSPHORUS: PHOSPHORUS: 2.5 mg/dL (ref 2.5–4.6)

## 2016-12-19 LAB — BASIC METABOLIC PANEL
ANION GAP: 6 (ref 5–15)
BUN: 18 mg/dL (ref 6–20)
CO2: 28 mmol/L (ref 22–32)
Calcium: 8.1 mg/dL — ABNORMAL LOW (ref 8.9–10.3)
Chloride: 102 mmol/L (ref 101–111)
Creatinine, Ser: 0.78 mg/dL (ref 0.61–1.24)
GFR calc non Af Amer: >60 mL/min (ref >60)
GLUCOSE: 131 mg/dL — AB (ref 65–99)
POTASSIUM: 3.8 mmol/L (ref 3.5–5.1)
Sodium: 136 mmol/L (ref 135–145)

## 2016-12-19 LAB — HIV ANTIBODY (ROUTINE TESTING W REFLEX): HIV Screen 4th Generation wRfx: NONREACTIVE

## 2016-12-19 LAB — HEMOGLOBIN A1C
HEMOGLOBIN A1C: 6 % — AB (ref 4.8–5.6)
MEAN PLASMA GLUCOSE: 126 mg/dL

## 2016-12-19 LAB — MAGNESIUM: MAGNESIUM: 1.8 mg/dL (ref 1.7–2.4)

## 2016-12-19 MED ORDER — IOPAMIDOL (ISOVUE-300) INJECTION 61%
INTRAVENOUS | Status: AC
  Administered 2016-12-19: 10:00:00
  Filled 2016-12-19: qty 30

## 2016-12-19 MED ORDER — IOPAMIDOL (ISOVUE-300) INJECTION 61%
15.0000 mL | Freq: Two times a day (BID) | INTRAVENOUS | Status: DC | PRN
  Administered 2016-12-19: 15 mL via ORAL
  Filled 2016-12-19: qty 30

## 2016-12-19 MED ORDER — IOPAMIDOL (ISOVUE-300) INJECTION 61%
100.0000 mL | Freq: Once | INTRAVENOUS | Status: AC | PRN
  Administered 2016-12-19: 100 mL via INTRAVENOUS

## 2016-12-19 MED ORDER — IOPAMIDOL (ISOVUE-300) INJECTION 61%
INTRAVENOUS | Status: AC
  Administered 2016-12-19: 100 mL via INTRAVENOUS
  Filled 2016-12-19: qty 100

## 2016-12-19 NOTE — Progress Notes (Signed)
Report given to receiving RN. Belongings with pt, VSS.

## 2016-12-19 NOTE — Progress Notes (Signed)
PROGRESS NOTE  Samuel Huffman JWJ:[id number] DOB: March 11, 1966 DOA: 12/18/2016 PCP: Patient, No Pcp Per  HPI/Recap of past 24 hours:  Less epigastric pain, no vomiting, no bm  Assessment/Plan: Principal Problem:   Hematemesis Active Problems:   History of alcohol abuse   Upper GI bleed   Essential hypertension   GI bleed   Bleeding gastric varices   Hematemesis:  -He is admitted to icu, he is intubated for egd on 6/26, now extubated, no active bleed --EGD:                "- Normal esophagus. No esophageal varices. - Type 1 isolated gastric varices (IGV1, varices located in the fundus), as the cause of bleeding without active bleeding high-risk stigmata. 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    - Duodenal deformity secondary to chronic pancreatitis." -he is continued on octreotide drip, ppi drip changed to iv ppi bid, rocephin started on admission discontinued per gi recommendation. -patient remain npo, on ivf, diet advancement per gi/general surgery.  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 He does has epigastric discomfort which he attribute to stomach bleed -lipase 71 on admission -will follow gi/general surgery recommendations  Early cirrhosis -There is at least radiological evidence that he has early cirrhosis on CT scan done in February. -patient report has stopped drinking alcohol -Per GI. Has negative hepatitis C. -Patient denies histroy of cirrhosis, he seems to be irritated about this diagnosis  Elevated blood sugar -He was not fasting, CBG was 194.  hemoglobin A1c 6, but may not be accurate in the setting of acute anemia, will need repeat a1c at hospital follow up   Code Status: full  Family Communication:  patient   Disposition Plan: transfer out of stepdown to med tele   Consultants:  GI  General surgery  Procedures:  Intubation and extubation on 6/26  egd on 6/26  Antibiotics:  Rocephin from admission to 6/27   Objective: BP 122/74 (BP Location: Right Arm)   Pulse 66   Temp 97.4 F (36.3 C) (Oral)   Resp 10   Ht 6\' 2"  (1.88 m)   Wt 90.3 kg (199 lb 1.2 oz)   SpO2 99%   BMI 25.56 kg/m   Intake/Output Summary (Last 24 hours) at 12/19/16 1531 Last data filed at 12/19/16 1300  Gross per 24 hour  Intake          3122.09 ml  Output             2650 ml  Net           472.09 ml   Filed Weights   12/18/16 1346 12/18/16 1453 12/19/16 0500  Weight: 89.9 kg (198 lb 3.1 oz) 90.3 kg (199 lb) 90.3 kg (199 lb 1.2 oz)    Exam:   General:  NAD  Cardiovascular: RRR  Respiratory: CTABL  Abdomen: mild epigastric tender, no guarding, no rebound, Soft/NT, positive BS  Musculoskeletal: No Edema  Neuro: aaox3  Data Reviewed: Basic Metabolic Panel:  Recent Labs Lab 12/13/16 2106 12/18/16 0020 12/19/16 0236  NA 134* 135 136  K 3.7 3.7 3.8  CL 99* 99* 102  CO2 23 26 28   GLUCOSE 182* 194* 131*  BUN 25* 20 18  CREATININE 0.92 0.75 0.78  CALCIUM 9.4 9.0 8.1*  MG  --   --  1.8  PHOS  --   --  2.5   Liver Function Tests:  Recent Labs Lab 12/13/16 2106 12/18/16 0020  AST 28 22  ALT 13* 13*  ALKPHOS 69 63  BILITOT 0.6 0.8  PROT 8.0 7.6  ALBUMIN 4.1 4.2    Recent Labs Lab 12/13/16 2106 12/18/16 0020  LIPASE 77* 71*   No results for input(s): AMMONIA in the last 168 hours. CBC:  Recent Labs Lab 12/13/16 2106 12/18/16 0020 12/18/16 1424 12/18/16 1958 12/19/16 0236 12/19/16 0817 12/19/16 1442  WBC 7.0 8.5 3.8* 4.2 3.6* 4.0 3.5*  NEUTROABS 5.6 6.5  --   --   --   --   --   HGB 11.9* 9.1* 7.3* 6.6* 7.5* 7.8* 7.8*  HCT 35.9* 27.7* 22.5* 20.0* 22.3* 23.7* 24.2*  MCV 97.8 100.7* 97.0 99.0 97.0 95.6 96.0  PLT 213 313 181 140* 140* 139* 149*    Cardiac Enzymes:   No results for input(s): CKTOTAL, CKMB, CKMBINDEX, TROPONINI in the last 168 hours. BNP (last 3 results) No results for input(s): BNP in the last 8760 hours.  ProBNP (last 3 results) No results for input(s): PROBNP in the last 8760 hours.  CBG: No results for input(s): GLUCAP in the last 168 hours.  Recent Results (from the past 240 hour(s))  MRSA PCR Screening     Status: None   Collection Time: 12/18/16  1:52 PM  Result Value Ref Range Status   MRSA by PCR NEGATIVE NEGATIVE Final    Comment:        The GeneXpert MRSA Assay (FDA approved for NASAL specimens only), is one component of a comprehensive MRSA colonization surveillance program. It is not intended to diagnose MRSA infection nor to guide or monitor treatment for MRSA infections.      Studies: Ct Abdomen Pelvis W Contrast  Result Date: 12/19/2016 CLINICAL DATA:  Abdominal pain with vomiting and dark stools. History of esophageal varices, pancreatitis and questionable cirrhosis. EXAM: CT ABDOMEN AND PELVIS WITH CONTRAST TECHNIQUE: Multidetector CT imaging of the abdomen and pelvis was performed using the standard protocol following bolus administration of intravenous contrast. CONTRAST:  100 ml Isovue-300. COMPARISON:  CT 08/01/2016.  Ultrasound 08/02/2016. FINDINGS: Lower chest: Linear atelectasis or scarring at both lung bases, similar to previous study. No significant pleural or pericardial effusion. Hepatobiliary: Mild heterogeneity of the parenchyma without focal abnormality. No evidence of gallstones, gallbladder Fallert thickening or biliary dilatation. Pancreas: Changes of chronic calcific pancreatitis are again noted with pancreatic atrophy. No pancreatic ductal dilatation or enlarging peripancreatic fluid collection. Previously demonstrated lesser sac and splenic hilum pseudocysts have decreased in size. Residual collection along the posterior Zachar of the proximal stomach measures up to 2.4 cm on  sagittal image 95. Spleen: Stable in size without focal intrinsic abnormality. There is a subcapsular fluid collection along the superior aspect of the spleen which is lenticular in shape, measuring 4.7 x 1.9 x 4.8 cm. Adrenals/Urinary Tract: Both adrenal glands appear normal. No suspicious renal findings. Probable tiny right renal cysts. No evidence of urinary tract calculus or hydronephrosis. The bladder appears unremarkable. Stomach/Bowel: The previously demonstrated diffuse gastric and proximal duodenal Pickrel thickening has improved. There is still some Nagy thickening at the gastric fundus and duodenal bulb. There is some soft tissue stranding around the duodenal bulb. The remainder of the small bowel, appendix and colon demonstrate no significant findings. Vascular/Lymphatic: There are no enlarged abdominal or  pelvic lymph nodes. Mild aortic and branch vessel atherosclerosis. There is chronic occlusion of the splenic vein. The portal and superior mesenteric veins are patent. There are multiple perigastric collateral veins. Reproductive: Stable mild enlargement of the prostate gland. The seminal vesicles appear normal. Other: No ascites or free air. Musculoskeletal: No acute or significant osseous findings. Old rib fractures on the left and postsurgical changes within the proximal left femur are noted. IMPRESSION: 1. No clear explanation for GI bleeding or acute findings demonstrated. 2. Sequela of chronic pancreatitis are again demonstrated with retroperitoneal inflammation and several fluctuating pseudocysts. Most of the cysts are improved from the prior study, although there is a new subcapsular one along the superior aspect of the spleen. 3. Previously demonstrated gastric and duodenal Mcnee thickening are improved. 4. 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 Electronically Signed   By: Carey Bullocks M.D.   On: 12/19/2016 14:59   Dg Chest Port 1 View  Result  Date: 12/19/2016 CLINICAL DATA:  Shortness of breath. EXAM: PORTABLE CHEST 1 VIEW COMPARISON:  08/01/2016 . FINDINGS: Mediastinum and hilar structures are normal. No acute pulmonary disease . Faint nodular opacity previously noted pulmonary apex not definitely identified, possibly related overlapping bony structures. Reference is made to prior chest x-ray report 08/01/2016. No pleural effusion or pneumothorax . Borderline cardiomegaly. No pulmonary venous congestion. IMPRESSION: 1.  No acute pulmonary disease.  Borderline cardiomegaly. 2. Previously identified tiny left apical pulmonary nodule is not definitely identified on today's chest x-ray, this may be secondary to overlying bony structures. Reference made to prior chest x-ray report 08/01/2016 . Electronically Signed   By: Maisie Fus  Register   On: 12/19/2016 06:50    Scheduled Meds: . Melene Muller ON 12/21/2016] pantoprazole  40 mg Intravenous Q12H  . sodium chloride flush  3 mL Intravenous Q12H    Continuous Infusions: . sodium chloride 125 mL/hr at 12/19/16 1200  . sodium chloride    . sodium chloride    . octreotide  (SANDOSTATIN)    IV infusion 50 mcg/hr (12/19/16 1200)     Time spent:  Donnell Beauchamp MD, PhD  [facility] Pager [phone]. If 7PM-7AM, please contact night-coverage at [email or web address], password [facility] 12/19/2016, 3:31 PM  LOS: 1 day

## 2016-12-19 NOTE — Progress Notes (Signed)
[facility] Progress Note  Chief Complaint:   Vomiting blood  Subjective: no further bleeding. No abdominal pain. A little agitated, getting conflicting information.   Objective:  Vital signs in last 24 hours: Temp:  [97 F (36.1 C)-98.4 F (36.9 C)] 97.4 F (36.3 C) (06/27 0700) Pulse Rate:  [65-84] 78 (06/27 0400) Resp:  [7-30] 16 (06/27 0400) BP: (114-137)/(54-90) 122/84 (06/27 0800) SpO2:  [96 %-100 %] 100 % (06/27 0400) Weight:  [198 lb 3.1 oz (89.9 kg)-199 lb (90.3 kg)] 199 lb (90.3 kg) (06/26 1453) Last BM Date: 12/17/16 General:   Alert, well-developed, white male in NAD EENT:  Normal hearing, non icteric sclera, conjunctive pink.  Heart:  Regular rate and rhythm; no murmurs. no lower extremity edema Pulm: Normal respiratory effort, lungs CTA bilaterally without wheezes or crackles. Abdomen:  Soft, nondistended, nontender.  Normal bowel sounds, no masses felt. No hepatomegaly.    Neurologic:  Alert and  oriented x4;  grossly normal neurologically. Psych:  Pleasant, cooperative.  Normal mood and affect.   Intake/Output from previous day: 06/26 0701 - 06/27 0700 In: 2796.7 [I.V.:2411.7; Blood:335; IV Piggyback:50] Out: 1100 [Urine:1100] Intake/Output this shift: Total I/O In: 350 [I.V.:350] Out: -   Lab Results:  Recent Labs  12/18/16 1958 12/19/16 0236 12/19/16 0817  WBC 4.2 3.6* 4.0  HGB 6.6* 7.5* 7.8*  HCT 20.0* 22.3* 23.7*  PLT 140* 140* 139*   BMET  Recent Labs  12/18/16 0020 12/19/16 0236  NA 135 136  K 3.7 3.8  CL 99* 102  CO2 26 28  GLUCOSE 194* 131*  BUN 20 18  CREATININE 0.75 0.78  CALCIUM 9.0 8.1*   LFT  Recent Labs  12/18/16 0020  PROT 7.6  ALBUMIN 4.2  AST 22  ALT 13*  ALKPHOS 63  BILITOT 0.8   PT/INR  Recent Labs  12/18/16 0020 12/18/16 1424  LABPROT 12.8 13.7  INR 0.96 1.05   Hepatitis Panel No results for input(s): HEPBSAG, HCVAB, HEPAIGM, HEPBIGM in the last 72 hours.  Dg Chest Port 1  View  Result Date: 12/19/2016 CLINICAL DATA:  Shortness of breath. EXAM: PORTABLE CHEST 1 VIEW COMPARISON:  08/01/2016 . FINDINGS: Mediastinum and hilar structures are normal. No acute pulmonary disease . Faint nodular opacity previously noted pulmonary apex not definitely identified, possibly related overlapping bony structures. Reference is made to prior chest x-ray report 08/01/2016. No pleural effusion or pneumothorax . Borderline cardiomegaly. No pulmonary venous congestion. IMPRESSION: 1.  No acute pulmonary disease.  Borderline cardiomegaly. 2. Previously identified tiny left apical pulmonary nodule is not definitely identified on today's chest x-ray, this may be secondary to overlying bony structures. Reference made to prior chest x-ray report 08/01/2016 . Electronically Signed   By: Maisie Fushomas  Register   On: 12/19/2016 06:50    ASSESSMENT / PLAN:   1. 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 There re was significant narrowing of splenic vein on doppler u/s in Feb. CTscan in Feb noted some subtle micronodular liver surface changes but he has no other evidence for cirrhosis (no esophageal varices, normal INR, normal albumin, no physical stigmata).  -Appreciate Surgery seeing for consideration of a splenectomy.  -continue IV ppi , Octreotide -Surgery has ordered a CTscan for today  2. ABL. Hgb fell from 11.9 to 6.6.   --received a unit of blood, hgb up a gram to 7.5 this am. Bleeding  resolved.   Willette Cluster , NP 12/19/2016, 10:05 AM  Principal Problem:   Hematemesis Active Problems:   History of alcohol abuse   Upper GI bleed   Essential hypertension   GI bleed   Bleeding gastric varices   LOS: 1 day   Willette Cluster ,NP 12/19/2016, 10:21 AM Pager number [phone]  GI ATTENDING  Interval history data reviewed. Agree with interval progress note. Case discussed with Dr. Abbey Chatters.  As previously outlined, the patient is felt to have acute and significant upper GI bleeding from primary gastric varices of the fundus. 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 A question of underlying cirrhosis has been raised. Patient may have fibrosis or he may not. If he does have liver disease, it is highly compensated with normal liver tests, albumin, prothrombin time, and platelets. No evidence of portal hypertension elsewhere (no esophageal varices, no portal gastropathy, no ascites). Agree with plans for repeat CT scan. Ultimately he is likely to need splenectomy. We'll follow.  Wilhemina Bonito. Eda Keys., M.D. [facility] Division of Gastroenterology

## 2016-12-19 NOTE — Progress Notes (Signed)
1 Day Post-Op    CC:  Hematemesis  Subjective: No further bleeding, hungry, denies cirrhosis.   Objective: Vital signs in last 24 hours: Temp:  [97 F (36.1 C)-98.4 F (36.9 C)] 97.5 F (36.4 C) (06/27 0113) Pulse Rate:  [65-84] 78 (06/27 0400) Resp:  [7-30] 16 (06/27 0400) BP: (112-137)/(54-90) 124/54 (06/27 0400) SpO2:  [96 %-100 %] 100 % (06/27 0400) Weight:  [89.9 kg (198 lb 3.1 oz)-90.3 kg (199 lb)] 90.3 kg (199 lb) (06/26 1453) Last BM Date: 12/17/16 IV:  2800 Urine 1100 Afebrile, VSS BMP OK, H/H 7.5/22.3 Platelets decreased at 140K CXR - stable no acute changes Lipase up on admit 71, LFT's are normal.  Intake/Output from previous day: 06/26 0701 - 06/27 0700 In: 2796.7 [I.V.:2411.7; Blood:335; IV Piggyback:50] Out: 1100 [Urine:1100] Intake/Output this shift: No intake/output data recorded.  General appearance: alert, cooperative and no distress Resp: clear to auscultation bilaterally GI: soft, non-tender; bowel sounds normal; no masses,  no organomegaly  Lab Results:   Recent Labs  12/18/16 1958 12/19/16 0236  WBC 4.2 3.6*  HGB 6.6* 7.5*  HCT 20.0* 22.3*  PLT 140* 140*    BMET  Recent Labs  12/18/16 0020 12/19/16 0236  NA 135 136  K 3.7 3.8  CL 99* 102  CO2 26 28  GLUCOSE 194* 131*  BUN 20 18  CREATININE 0.75 0.78  CALCIUM 9.0 8.1*   PT/INR  Recent Labs  12/18/16 0020 12/18/16 1424  LABPROT 12.8 13.7  INR 0.96 1.05     Recent Labs Lab 12/13/16 2106 12/18/16 0020  AST 28 22  ALT 13* 13*  ALKPHOS 69 63  BILITOT 0.6 0.8  PROT 8.0 7.6  ALBUMIN 4.1 4.2     Lipase     Component Value Date/Time   LIPASE 71 (H) 12/18/2016 0020     Medications: . [START ON 12/21/2016] pantoprazole  40 mg Intravenous Q12H  . sodium chloride flush  3 mL Intravenous Q12H   . sodium chloride 125 mL/hr at 12/19/16 0116  . sodium chloride    . sodium chloride    . cefTRIAXone (ROCEPHIN)  IV Stopped (12/19/16 0436)  . octreotide   (SANDOSTATIN)    IV infusion 50 mcg/hr (12/18/16 2333)  . pantoprozole (PROTONIX) infusion 8 mg/hr (12/19/16 0437)   Anti-infectives    Start     Dose/Rate Route Frequency Ordered Stop   12/19/16 0400  cefTRIAXone (ROCEPHIN) 2 g in dextrose 5 % 50 mL IVPB     2 g 100 mL/hr over 30 Minutes Intravenous Every 24 hours 12/18/16 1351     12/18/16 0415  cefTRIAXone (ROCEPHIN) 1 g in dextrose 5 % 50 mL IVPB     1 g 100 mL/hr over 30 Minutes Intravenous  Once 12/18/16 0408 12/18/16 0536      Assessment/Plan Upper GI bleed secondary to gastric varices Chronic pancreatitis with pseudocyst - Dr. Christella HartiganJacobs Hx of Cirrhosis Anemia/thromocytopenia PTSD Chronic abdominal pain ? Hx of ETOH use Tobacco use FEN:  No IV fluids/IV Protonix/No diet ID: Rocephin 6/25 =>> day 3 DVT:  SCD   Plan:  Repeat CT with contrast, oral and IV.  Dr. Abbey Chattersosenbower will follow with you.     LOS: 1 day    Justn Quale 12/19/2016 [phone]

## 2016-12-20 DIAGNOSIS — R935 Abnormal findings on diagnostic imaging of other abdominal regions, including retroperitoneum: Secondary | ICD-10-CM

## 2016-12-20 DIAGNOSIS — I81 Portal vein thrombosis: Secondary | ICD-10-CM

## 2016-12-20 LAB — CBC
HCT: 22 % — ABNORMAL LOW (ref 39.0–52.0)
HEMOGLOBIN: 7.4 g/dL — AB (ref 13.0–17.0)
MCH: 31.9 pg (ref 26.0–34.0)
MCHC: 33.6 g/dL (ref 30.0–36.0)
MCV: 94.8 fL (ref 78.0–100.0)
PLATELETS: 131 10*3/uL — AB (ref 150–400)
RBC: 2.32 MIL/uL — AB (ref 4.22–5.81)
RDW: 14.4 % (ref 11.5–15.5)
WBC: 3.1 10*3/uL — AB (ref 4.0–10.5)

## 2016-12-20 LAB — LIPASE, BLOOD: Lipase: 15 U/L (ref 11–51)

## 2016-12-20 LAB — BASIC METABOLIC PANEL
ANION GAP: 7 (ref 5–15)
BUN: 11 mg/dL (ref 6–20)
CALCIUM: 8.1 mg/dL — AB (ref 8.9–10.3)
CO2: 25 mmol/L (ref 22–32)
CREATININE: 0.82 mg/dL (ref 0.61–1.24)
Chloride: 103 mmol/L (ref 101–111)
GFR calc non Af Amer: >60 mL/min (ref >60)
Glucose, Bld: 100 mg/dL — ABNORMAL HIGH (ref 65–99)
Potassium: 3.5 mmol/L (ref 3.5–5.1)
SODIUM: 135 mmol/L (ref 135–145)

## 2016-12-20 LAB — HEPATIC FUNCTION PANEL
ALBUMIN: 3.3 g/dL — AB (ref 3.5–5.0)
ALK PHOS: 47 U/L (ref 38–126)
ALT: 11 U/L — AB (ref 17–63)
AST: 18 U/L (ref 15–41)
Bilirubin, Direct: <0.1 mg/dL — ABNORMAL LOW (ref 0.1–0.5)
TOTAL PROTEIN: 5.8 g/dL — AB (ref 6.5–8.1)
Total Bilirubin: 0.6 mg/dL (ref 0.3–1.2)

## 2016-12-20 LAB — MAGNESIUM: Magnesium: 1.8 mg/dL (ref 1.7–2.4)

## 2016-12-20 MED ORDER — CHLORHEXIDINE GLUCONATE CLOTH 2 % EX PADS
6.0000 | MEDICATED_PAD | Freq: Once | CUTANEOUS | Status: AC
  Administered 2016-12-21: 6 via TOPICAL

## 2016-12-20 MED ORDER — CEFAZOLIN SODIUM-DEXTROSE 2-4 GM/100ML-% IV SOLN
2.0000 g | Freq: Once | INTRAVENOUS | Status: AC
Start: 2016-12-21 — End: 2016-12-21
  Administered 2016-12-21: 2 g via INTRAVENOUS

## 2016-12-20 MED ORDER — CHLORHEXIDINE GLUCONATE CLOTH 2 % EX PADS
6.0000 | MEDICATED_PAD | Freq: Once | CUTANEOUS | Status: AC
  Administered 2016-12-20: 6 via TOPICAL

## 2016-12-20 NOTE — Progress Notes (Signed)
PROGRESS NOTE  Samuel Huffman:[id number] DOB: [birth year] DOA: 12/18/2016 PCP: Patient, No Pcp Per  HPI/Recap of past 24 hours:  Feeling better today, no overt bleeding, no fever  Assessment/Plan: Principal Problem:   Hematemesis Active Problems:   History of alcohol abuse   Upper GI bleed   Essential hypertension   GI bleed   Bleeding gastric varices   Hematemesis:  -He is admitted to icu, he is intubated for egd on 6/26, now extubated, no active bleed --EGD:                "- Normal esophagus. No esophageal varices. - Type 1 isolated gastric varices (IGV1, varices located in the fundus), as the cause of bleeding without active bleeding high-risk stigmata. 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 He does has epigastric discomfort which he attribute to stomach bleed -lipase 71 on admission, lipase 15 on 6/28 -splenomectomy planned on 6/29, plan per  gi/general surgery.  Early cirrhosis -There is at least radiological evidence that he has early cirrhosis on CT scan done in February. -patient report has stopped drinking alcohol -Per GI. Has negative hepatitis C. -Patient denies histroy of cirrhosis, he seems to be irritated about this diagnosis  Elevated blood sugar -He was not fasting, CBG was 194.  -hemoglobin A1c 6, but may not be accurate in the setting of acute anemia, will need repeat a1c at hospital follow up   Code  Status: full  Family Communication: patient   Disposition Plan: pending   Consultants:  GI  General surgery  Procedures:  Intubation and extubation on 6/26  egd on 6/26  Antibiotics:  Rocephin from admission to 6/27   Objective: BP 131/75 (BP Location: Right Arm)   Pulse 61   Temp 97.8 F (36.6 C) (Oral)   Resp 18   Ht 6\' 2"  (1.88 m)   Wt 92.3 kg (203 lb 8 oz)   SpO2 100%   BMI 26.13 kg/m   Intake/Output Summary (Last 24 hours) at 12/20/16 1446 Last data filed at 12/20/16 1321  Gross per 24 hour  Intake             2830 ml  Output             3050 ml  Net             -220 ml   Filed Weights   12/18/16 1453 12/19/16 0500 12/20/16 0503  Weight: 90.3 kg (199 lb) 90.3 kg (199 lb 1.2 oz) 92.3 kg (203 lb 8 oz)    Exam:   General:  NAD  Cardiovascular: RRR  Respiratory: CTABL  Abdomen: mild epigastric tender, no guarding, no rebound, Soft/NT, positive BS  Musculoskeletal: No Edema  Neuro: aaox3  Data Reviewed: Basic Metabolic Panel:  Recent Labs Lab 12/13/16 2106 12/18/16 0020 12/19/16 0236 12/20/16 0159  NA 134* 135 136 135  K 3.7 3.7 3.8 3.5  CL 99* 99* 102 103  CO2 23 26 28  25  GLUCOSE 182* 194* 131* 100*  BUN 25* 20 18 11   CREATININE 0.92 0.75 0.78 0.82  CALCIUM 9.4 9.0 8.1* 8.1*  MG  --   --  1.8 1.8  PHOS  --   --  2.5  --    Liver Function Tests:  Recent Labs Lab 12/13/16 2106 12/18/16 0020 12/20/16 0159  AST 28 22 18   ALT 13* 13* 11*  ALKPHOS 69 63 47  BILITOT 0.6 0.8 0.6  PROT 8.0 7.6 5.8*  ALBUMIN 4.1 4.2 3.3*    Recent Labs Lab 12/13/16 2106 12/18/16 0020 12/20/16 0159  LIPASE 77* 71* 15   No results for input(s): AMMONIA in the last 168 hours. CBC:  Recent Labs Lab 12/13/16 2106 12/18/16 0020  12/19/16 0236 12/19/16 0817 12/19/16 1442 12/19/16 1948 12/20/16 0159  WBC 7.0 8.5  < > 3.6* 4.0 3.5* 3.5* 3.1*  NEUTROABS 5.6 6.5  --   --   --   --   --   --   HGB 11.9* 9.1*  < > 7.5* 7.8* 7.8* 7.7*  7.4*  HCT 35.9* 27.7*  < > 22.3* 23.7* 24.2* 23.2* 22.0*  MCV 97.8 100.7*  < > 97.0 95.6 96.0 94.7 94.8  PLT 213 313  < > 140* 139* 149* 131* 131*  < > = values in this interval not displayed. Cardiac Enzymes:   No results for input(s): CKTOTAL, CKMB, CKMBINDEX, TROPONINI in the last 168 hours. BNP (last 3 results) No results for input(s): BNP in the last 8760 hours.  ProBNP (last 3 results) No results for input(s): PROBNP in the last 8760 hours.  CBG: No results for input(s): GLUCAP in the last 168 hours.  Recent Results (from the past 240 hour(s))  MRSA PCR Screening     Status: None   Collection Time: 12/18/16  1:52 PM  Result Value Ref Range Status   MRSA by PCR NEGATIVE NEGATIVE Final    Comment:        The GeneXpert MRSA Assay (FDA approved for NASAL specimens only), is one component of a comprehensive MRSA colonization surveillance program. It is not intended to diagnose MRSA infection nor to guide or monitor treatment for MRSA infections.      Studies: No results found.  Scheduled Meds: . [START ON 12/21/2016] pantoprazole  40 mg Intravenous Q12H  . sodium chloride flush  3 mL Intravenous Q12H    Continuous Infusions: . sodium chloride    . sodium chloride 50 mL/hr at 12/20/16 1150  . [START ON 12/21/2016]  ceFAZolin (ANCEF) IV       Time spent: 25mins  Ruthanne Mcneish MD, PhD  [facility] Pager [phone]. If 7PM-7AM, please contact night-coverage at [email or web address], password [email or web address] 12/20/2016, 2:46 PM  LOS: 2 days

## 2016-12-20 NOTE — Progress Notes (Signed)
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  Will make NPO after 0200 tomorrow.   LOS: 2 days     2 Days Post-Op  Chief Complaint/Subjective: No complaints  Objective: Vital signs in last 24 hours: Temp:  [98 F (36.7 C)-99.2 F (37.3 C)] 98 F (36.7 C) (06/28 0503) Pulse Rate:  [59-77] 59 (06/28 0503) Resp:  [2-18] 18 (06/28 0503) BP: (118-147)/(74-86) 118/74 (06/28 0503) SpO2:  [96 %-100 %] 100 % (06/28 0503) Weight:  [92.3 kg (203 lb 8 oz)] 92.3 kg (203 lb 8 oz) (06/28 0503) Last BM Date: 12/17/16  Intake/Output from previous day: 06/27 0701 - 06/28 0700 In: 3705 [P.O.:480; I.V.:3225] Out: 2375 [Urine:2375] Intake/Output this shift: Total I/O In: -  Out: 700 [Urine:700]  PE: General- In NAD.  Awake and alert.   Lab Results:   Recent Labs  12/19/16 1948 12/20/16 0159  WBC 3.5* 3.1*  HGB 7.7* 7.4*  HCT 23.2* 22.0*  PLT 131* 131*   BMET  Recent Labs  12/19/16 0236 12/20/16 0159  NA 136 135  K 3.8 3.5  CL 102 103  CO2 28 25  GLUCOSE 131* 100*  BUN 18 11  CREATININE 0.78 0.82  CALCIUM 8.1* 8.1*   PT/INR  Recent Labs  12/18/16 0020 12/18/16 1424  LABPROT 12.8 13.7  INR 0.96 1.05   Comprehensive Metabolic Panel:    Component Value Date/Time   NA 135 12/20/2016 0159   NA 136 12/19/2016 0236   K 3.5 12/20/2016 0159   K 3.8 12/19/2016 0236   CL 103 12/20/2016 0159   CL 102 12/19/2016 0236   CO2 25 12/20/2016 0159   CO2 28 12/19/2016 0236   BUN 11 12/20/2016 0159   BUN 18 12/19/2016 0236   CREATININE 0.82 12/20/2016 0159   CREATININE 0.78 12/19/2016 0236   GLUCOSE 100 (H) 12/20/2016 0159   GLUCOSE 131 (H) 12/19/2016 0236   CALCIUM 8.1 (L) 12/20/2016 0159   CALCIUM 8.1 (L) 12/19/2016 0236   AST 18 12/20/2016 0159   AST 22 12/18/2016 0020   ALT 11  (L) 12/20/2016 0159   ALT 13 (L) 12/18/2016 0020   ALKPHOS 47 12/20/2016 0159   ALKPHOS 63 12/18/2016 0020   BILITOT 0.6 12/20/2016 0159   BILITOT 0.8 12/18/2016 0020   PROT 5.8 (L) 12/20/2016 0159   PROT 7.6 12/18/2016 0020   ALBUMIN 3.3 (L) 12/20/2016 0159   ALBUMIN 4.2 12/18/2016 0020     Studies/Results: Ct Abdomen Pelvis W Contrast  Result Date: 12/19/2016 CLINICAL DATA:  Abdominal pain with vomiting and dark stools. History of esophageal varices, pancreatitis and questionable cirrhosis. EXAM: CT ABDOMEN AND PELVIS WITH CONTRAST TECHNIQUE: Multidetector CT imaging of the abdomen and pelvis was performed using the standard protocol following bolus administration of intravenous contrast. CONTRAST:  100 ml Isovue-300. COMPARISON:  CT 08/01/2016.  Ultrasound 08/02/2016. FINDINGS: Lower chest: Linear atelectasis or scarring at both lung bases, similar to previous study. No significant pleural or pericardial effusion. Hepatobiliary: Mild heterogeneity of the parenchyma without focal abnormality. No evidence of gallstones, gallbladder Wiacek thickening or biliary dilatation. Pancreas: Changes of chronic calcific pancreatitis are again noted with pancreatic atrophy. No pancreatic ductal dilatation or enlarging peripancreatic fluid collection. Previously demonstrated lesser sac and splenic hilum pseudocysts have decreased in size. Residual collection along the posterior  Sisler of the proximal stomach measures up to 2.4 cm on sagittal image 95. Spleen: Stable in size without focal intrinsic abnormality. There is a subcapsular fluid collection along the superior aspect of the spleen which is lenticular in shape, measuring 4.7 x 1.9 x 4.8 cm. Adrenals/Urinary Tract: Both adrenal glands appear normal. No suspicious renal findings. Probable tiny right renal cysts. No evidence of urinary tract calculus or hydronephrosis. The bladder appears unremarkable. Stomach/Bowel: The previously demonstrated diffuse gastric  and proximal duodenal Shiraishi thickening has improved. There is still some Spinello thickening at the gastric fundus and duodenal bulb. There is some soft tissue stranding around the duodenal bulb. The remainder of the small bowel, appendix and colon demonstrate no significant findings. Vascular/Lymphatic: There are no enlarged abdominal or pelvic lymph nodes. Mild aortic and branch vessel atherosclerosis. There is chronic occlusion of the splenic vein. The portal and superior mesenteric veins are patent. There are multiple perigastric collateral veins. Reproductive: Stable mild enlargement of the prostate gland. The seminal vesicles appear normal. Other: No ascites or free air. Musculoskeletal: No acute or significant osseous findings. Old rib fractures on the left and postsurgical changes within the proximal left femur are noted. IMPRESSION: 1. No clear explanation for GI bleeding or acute findings demonstrated. 2. Sequela of chronic pancreatitis are again demonstrated with retroperitoneal inflammation and several fluctuating pseudocysts. Most of the cysts are improved from the prior study, although there is a new subcapsular one along the superior aspect of the spleen. 3. Previously demonstrated gastric and duodenal Wiacek thickening are improved. 4. 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 Electronically Signed   By: Carey Bullocks M.D.   On: 12/19/2016 14:59   Dg Chest Port 1 View  Result Date: 12/19/2016 CLINICAL DATA:  Shortness of breath. EXAM: PORTABLE CHEST 1 VIEW COMPARISON:  08/01/2016 . FINDINGS: Mediastinum and hilar structures are normal. No acute pulmonary disease . Faint nodular opacity previously noted pulmonary apex not definitely identified, possibly related overlapping bony structures. Reference is made to prior chest x-ray report 08/01/2016. No pleural effusion or pneumothorax . Borderline cardiomegaly. No pulmonary venous congestion. IMPRESSION: 1.  No  acute pulmonary disease.  Borderline cardiomegaly. 2. Previously identified tiny left apical pulmonary nodule is not definitely identified on today's chest x-ray, this may be secondary to overlying bony structures. Reference made to prior chest x-ray report 08/01/2016 . Electronically Signed   By: Maisie Fus  Register   On: 12/19/2016 06:50    Anti-infectives: Anti-infectives    Start     Dose/Rate Route Frequency Ordered Stop   12/19/16 0400  cefTRIAXone (ROCEPHIN) 2 g in dextrose 5 % 50 mL IVPB  Status:  Discontinued     2 g 100 mL/hr over 30 Minutes Intravenous Every 24 hours 12/18/16 1351 12/19/16 1528   12/18/16 0415  cefTRIAXone (ROCEPHIN) 1 g in dextrose 5 % 50 mL IVPB     1 g 100 mL/hr over 30 Minutes Intravenous  Once 12/18/16 0408 12/18/16 0536       Tremane Spurgeon J 12/20/2016

## 2016-12-20 NOTE — Progress Notes (Signed)
     [facility] Progress Note  Chief Complaint:    Gi bleed  Subjective: feels fine today. No further bleeding.   Objective:  Vital signs in last 24 hours: Temp:  [98 F (36.7 C)-99.2 F (37.3 C)] 98 F (36.7 C) (06/28 0503) Pulse Rate:  [59-77] 59 (06/28 0503) Resp:  [2-18] 18 (06/28 0503) BP: (118-147)/(74-86) 118/74 (06/28 0503) SpO2:  [96 %-100 %] 100 % (06/28 0503) Weight:  [203 lb 8 oz (92.3 kg)] 203 lb 8 oz (92.3 kg) (06/28 0503) Last BM Date: 12/17/16 General:   Alert, well-developed, white male in NAD EENT:  Normal hearing, non icteric sclera, conjunctive pink.  Heart:  Regular rate and rhythm; no lower extremity edema Pulm: Normal respiratory effort, lungs CTA bilaterally without wheezes or crackles. Abdomen:  Soft, nondistended, nontender.  Normal bowel sounds, no masses felt. No hepatomegaly.    Neurologic:  Alert and  oriented x4;  grossly normal neurologically. Psych:  Pleasant, cooperative.  Normal mood and affect.   Intake/Output from previous day: 06/27 0701 - 06/28 0700 In: 3705 [P.O.:480; I.V.:3225] Out: 2375 [Urine:2375] Intake/Output this shift: Total I/O In: -  Out: 700 [Urine:700]  Lab Results:  Recent Labs  12/19/16 1442 12/19/16 1948 12/20/16 0159  WBC 3.5* 3.5* 3.1*  HGB 7.8* 7.7* 7.4*  HCT 24.2* 23.2* 22.0*  PLT 149* 131* 131*   BMET  Recent Labs  12/18/16 0020 12/19/16 0236 12/20/16 0159  NA 135 136 135  K 3.7 3.8 3.5  CL 99* 102 103  CO2 26 28 25   GLUCOSE 194* 131* 100*  BUN 20 18 11   CREATININE 0.75 0.78 0.82  CALCIUM 9.0 8.1* 8.1*   LFT  Recent Labs  12/20/16 0159  PROT 5.8*  ALBUMIN 3.3*  AST 18  ALT 11*  ALKPHOS 47  BILITOT 0.6  BILIDIR <0.1*  IBILI NOT CALCULATED   PT/INR  Recent Labs  12/18/16 0020 12/18/16 1424  LABPROT 12.8 13.7  INR 0.96 1.05    ASSESSMENT / PLAN:   51 yo male with chronic pancreatitis / chronic splenic vein occlusion presenting with GI bleeding from  gastric varices. Bleeding has resolved. Hgb low but stable at 7.4 post unit of blood. Surgery following, will need splenectomy. Sounds like plan is to take him to OR tomorrow.   Principal Problem:   Hematemesis Active Problems:   History of alcohol abuse   Upper GI bleed   Essential hypertension   GI bleed   Bleeding gastric varices    LOS: 2 days   Willette ClusterPaula Guenther ,NP 12/20/2016, 9:12 AM  Pager number [phone]  GI ATTENDING  Interval history and data reviewed. Patient personally seen and examined. CT scan demonstrates splenic vein thrombosis and gastric varices. Changes of chronic pancreatitis pseudocyst. No radiographic evidence of liver disease or portal hypertension. No clinical or laboratory evidence for liver disease. Agree with plans for splenectomy as definitive treatment for his leading gastric varices. No role for octreotide. This may be stopped. Nothing additional to add from GI standpoint. Discussed with patient. We're available for questions or new issues. Will sign off. Thank you.  Wilhemina BonitoJohn N. Eda KeysPerry, Jr., M.D. [place], [facility] of Gastroenterology

## 2016-12-21 ENCOUNTER — Encounter (HOSPITAL_COMMUNITY): Admission: EM | Disposition: A | Discharge status: Home / Self Care | Payer: Self-pay | Source: Home / Self Care

## 2016-12-21 ENCOUNTER — Inpatient Hospital Stay (HOSPITAL_COMMUNITY): Payer: BLUE CROSS/BLUE SHIELD | Admitting: Anesthesiology

## 2016-12-21 ENCOUNTER — Encounter (HOSPITAL_COMMUNITY): Payer: Self-pay | Admitting: Internal Medicine

## 2016-12-21 DIAGNOSIS — Z9889 Other specified postprocedural states: Secondary | ICD-10-CM

## 2016-12-21 DIAGNOSIS — K7030 Alcoholic cirrhosis of liver without ascites: Secondary | ICD-10-CM

## 2016-12-21 HISTORY — PX: LAPAROSCOPY: SHX197

## 2016-12-21 LAB — POCT I-STAT 7, (LYTES, BLD GAS, ICA,H+H)
Acid-base deficit: 3 mmol/L — ABNORMAL HIGH (ref 0.0–2.0)
Bicarbonate: 22.3 mmol/L (ref 20.0–28.0)
Calcium, Ion: 1.15 mmol/L (ref 1.15–1.40)
HCT: 24 % — ABNORMAL LOW (ref 39.0–52.0)
HEMOGLOBIN: 8.2 g/dL — AB (ref 13.0–17.0)
O2 Saturation: 100 %
PCO2 ART: 37.5 mmHg (ref 32.0–48.0)
PH ART: 7.374 (ref 7.350–7.450)
POTASSIUM: 3.5 mmol/L (ref 3.5–5.1)
Sodium: 137 mmol/L (ref 135–145)
TCO2: 24 mmol/L (ref 0–100)
pO2, Arterial: 195 mmHg — ABNORMAL HIGH (ref 83.0–108.0)

## 2016-12-21 LAB — CBC WITH DIFFERENTIAL/PLATELET
BASOS PCT: 1 %
Basophils Absolute: 0 10*3/uL (ref 0.0–0.1)
Eosinophils Absolute: 0.3 10*3/uL (ref 0.0–0.7)
Eosinophils Relative: 10 %
HEMATOCRIT: 23.3 % — AB (ref 39.0–52.0)
Hemoglobin: 7.6 g/dL — ABNORMAL LOW (ref 13.0–17.0)
LYMPHS ABS: 0.6 10*3/uL — AB (ref 0.7–4.0)
LYMPHS PCT: 18 %
MCH: 31.3 pg (ref 26.0–34.0)
MCHC: 32.6 g/dL (ref 30.0–36.0)
MCV: 95.9 fL (ref 78.0–100.0)
MONO ABS: 0.4 10*3/uL (ref 0.1–1.0)
MONOS PCT: 13 %
NEUTROS ABS: 1.8 10*3/uL (ref 1.7–7.7)
NEUTROS PCT: 58 %
Platelets: 164 10*3/uL (ref 150–400)
RBC: 2.43 MIL/uL — ABNORMAL LOW (ref 4.22–5.81)
RDW: 14.2 % (ref 11.5–15.5)
WBC: 3.1 10*3/uL — ABNORMAL LOW (ref 4.0–10.5)

## 2016-12-21 LAB — MAGNESIUM: Magnesium: 1.8 mg/dL (ref 1.7–2.4)

## 2016-12-21 LAB — MRSA PCR SCREENING: MRSA by PCR: NEGATIVE

## 2016-12-21 LAB — BASIC METABOLIC PANEL
ANION GAP: 8 (ref 5–15)
BUN: 7 mg/dL (ref 6–20)
CHLORIDE: 100 mmol/L — AB (ref 101–111)
CO2: 29 mmol/L (ref 22–32)
Calcium: 8.7 mg/dL — ABNORMAL LOW (ref 8.9–10.3)
Creatinine, Ser: 0.72 mg/dL (ref 0.61–1.24)
GFR calc non Af Amer: >60 mL/min (ref >60)
GLUCOSE: 125 mg/dL — AB (ref 65–99)
POTASSIUM: 3.5 mmol/L (ref 3.5–5.1)
Sodium: 137 mmol/L (ref 135–145)

## 2016-12-21 LAB — PREPARE RBC (CROSSMATCH)

## 2016-12-21 SURGERY — LAPAROSCOPY, DIAGNOSTIC
Anesthesia: General

## 2016-12-21 MED ORDER — BUPIVACAINE HCL (PF) 0.5 % IJ SOLN
INTRAMUSCULAR | Status: AC
  Filled 2016-12-21: qty 90

## 2016-12-21 MED ORDER — PROPOFOL 10 MG/ML IV BOLUS
INTRAVENOUS | Status: AC
  Filled 2016-12-21: qty 20

## 2016-12-21 MED ORDER — EPHEDRINE 5 MG/ML INJ
INTRAVENOUS | Status: AC
  Filled 2016-12-21: qty 10

## 2016-12-21 MED ORDER — ROCURONIUM BROMIDE 50 MG/5ML IV SOSY
PREFILLED_SYRINGE | INTRAVENOUS | Status: AC
  Filled 2016-12-21: qty 5

## 2016-12-21 MED ORDER — SUCCINYLCHOLINE CHLORIDE 200 MG/10ML IV SOSY
PREFILLED_SYRINGE | INTRAVENOUS | Status: AC
  Filled 2016-12-21: qty 10

## 2016-12-21 MED ORDER — DIPHENHYDRAMINE HCL 50 MG/ML IJ SOLN: 12.5000 mg | Freq: Four times a day (QID) | INTRAMUSCULAR | Status: DC | PRN

## 2016-12-21 MED ORDER — MENTHOL 3 MG MT LOZG
1.0000 | LOZENGE | OROMUCOSAL | Status: DC | PRN
  Administered 2016-12-22: 3 mg via ORAL
  Filled 2016-12-21: qty 9

## 2016-12-21 MED ORDER — SUCCINYLCHOLINE CHLORIDE 200 MG/10ML IV SOSY
PREFILLED_SYRINGE | INTRAVENOUS | Status: DC | PRN
  Administered 2016-12-21: 130 mg via INTRAVENOUS

## 2016-12-21 MED ORDER — LACTATED RINGERS IV SOLN
INTRAVENOUS | Status: DC
  Administered 2016-12-21 (×3): via INTRAVENOUS

## 2016-12-21 MED ORDER — HYDROMORPHONE HCL 1 MG/ML IJ SOLN
2.0000 mg | INTRAMUSCULAR | Status: DC
  Administered 2016-12-21: 2 mg via INTRAVENOUS

## 2016-12-21 MED ORDER — HYDROMORPHONE HCL 2 MG/ML IJ SOLN
INTRAMUSCULAR | Status: AC
  Filled 2016-12-21: qty 1

## 2016-12-21 MED ORDER — DIPHENHYDRAMINE HCL 12.5 MG/5ML PO ELIX: 12.5000 mg | ORAL_SOLUTION | Freq: Four times a day (QID) | ORAL | Status: DC | PRN

## 2016-12-21 MED ORDER — 0.9 % SODIUM CHLORIDE (POUR BTL) OPTIME
TOPICAL | Status: DC | PRN
  Administered 2016-12-21: 4000 mL

## 2016-12-21 MED ORDER — DEXAMETHASONE SODIUM PHOSPHATE 10 MG/ML IJ SOLN
INTRAMUSCULAR | Status: AC
  Filled 2016-12-21: qty 1

## 2016-12-21 MED ORDER — HYDROMORPHONE HCL 1 MG/ML IJ SOLN
INTRAMUSCULAR | Status: DC | PRN
  Administered 2016-12-21: 0.5 mg via INTRAVENOUS

## 2016-12-21 MED ORDER — ONDANSETRON HCL 4 MG/2ML IJ SOLN: 4.0000 mg | Freq: Four times a day (QID) | INTRAMUSCULAR | Status: DC | PRN

## 2016-12-21 MED ORDER — HYDROMORPHONE HCL 1 MG/ML IJ SOLN: 0.2500 mg | INTRAMUSCULAR | Status: DC | PRN

## 2016-12-21 MED ORDER — ROCURONIUM BROMIDE 50 MG/5ML IV SOSY
PREFILLED_SYRINGE | INTRAVENOUS | Status: DC | PRN
  Administered 2016-12-21: 20 mg via INTRAVENOUS
  Administered 2016-12-21: 50 mg via INTRAVENOUS
  Administered 2016-12-21 (×2): 20 mg via INTRAVENOUS
  Administered 2016-12-21: 10 mg via INTRAVENOUS

## 2016-12-21 MED ORDER — LIDOCAINE 2% (20 MG/ML) 5 ML SYRINGE
INTRAMUSCULAR | Status: DC | PRN
  Administered 2016-12-21: 100 mg via INTRAVENOUS

## 2016-12-21 MED ORDER — SODIUM CHLORIDE 0.9 % IV SOLN: 10.0000 mL/h | Freq: Once | INTRAVENOUS | Status: DC

## 2016-12-21 MED ORDER — PHENYLEPHRINE 40 MCG/ML (10ML) SYRINGE FOR IV PUSH (FOR BLOOD PRESSURE SUPPORT)
PREFILLED_SYRINGE | INTRAVENOUS | Status: DC | PRN
  Administered 2016-12-21 (×6): 80 ug via INTRAVENOUS

## 2016-12-21 MED ORDER — PHENYLEPHRINE HCL 10 MG/ML IJ SOLN
INTRAVENOUS | Status: DC | PRN
  Administered 2016-12-21: 50 ug/min via INTRAVENOUS

## 2016-12-21 MED ORDER — NALOXONE HCL 0.4 MG/ML IJ SOLN: 0.4000 mg | INTRAMUSCULAR | Status: DC | PRN

## 2016-12-21 MED ORDER — LACTATED RINGERS IR SOLN
Status: DC | PRN
  Administered 2016-12-21: 1000 mL

## 2016-12-21 MED ORDER — DEXAMETHASONE SODIUM PHOSPHATE 10 MG/ML IJ SOLN
INTRAMUSCULAR | Status: DC | PRN
  Administered 2016-12-21: 10 mg via INTRAVENOUS

## 2016-12-21 MED ORDER — PROMETHAZINE HCL 25 MG/ML IJ SOLN: 6.2500 mg | INTRAMUSCULAR | Status: DC | PRN

## 2016-12-21 MED ORDER — KETAMINE HCL 10 MG/ML IJ SOLN
INTRAMUSCULAR | Status: AC
  Filled 2016-12-21: qty 1

## 2016-12-21 MED ORDER — CEFAZOLIN SODIUM-DEXTROSE 2-4 GM/100ML-% IV SOLN
INTRAVENOUS | Status: AC
  Filled 2016-12-21: qty 100

## 2016-12-21 MED ORDER — FENTANYL CITRATE (PF) 100 MCG/2ML IJ SOLN
INTRAMUSCULAR | Status: DC | PRN
  Administered 2016-12-21 (×9): 50 ug via INTRAVENOUS

## 2016-12-21 MED ORDER — SODIUM CHLORIDE 0.9% FLUSH: 9.0000 mL | INTRAVENOUS | Status: DC | PRN

## 2016-12-21 MED ORDER — HYDROMORPHONE HCL 1 MG/ML IJ SOLN
2.0000 mg | INTRAMUSCULAR | Status: AC
  Administered 2016-12-21 – 2016-12-22 (×4): 2 mg via INTRAVENOUS
  Filled 2016-12-21 (×5): qty 2

## 2016-12-21 MED ORDER — SUGAMMADEX SODIUM 200 MG/2ML IV SOLN
INTRAVENOUS | Status: AC
  Filled 2016-12-21: qty 2

## 2016-12-21 MED ORDER — PROPOFOL 10 MG/ML IV BOLUS
INTRAVENOUS | Status: DC | PRN
  Administered 2016-12-21: 200 mg via INTRAVENOUS

## 2016-12-21 MED ORDER — FENTANYL CITRATE (PF) 250 MCG/5ML IJ SOLN
INTRAMUSCULAR | Status: AC
  Filled 2016-12-21: qty 5

## 2016-12-21 MED ORDER — LIDOCAINE 2% (20 MG/ML) 5 ML SYRINGE
INTRAMUSCULAR | Status: AC
  Filled 2016-12-21: qty 5

## 2016-12-21 MED ORDER — PHENOL 1.4 % MT LIQD
1.0000 | OROMUCOSAL | Status: DC | PRN
  Administered 2016-12-25: 1 via OROMUCOSAL
  Filled 2016-12-21 (×2): qty 177

## 2016-12-21 MED ORDER — LACTATED RINGERS IV SOLN
INTRAVENOUS | Status: DC | PRN
  Administered 2016-12-21: 17:00:00 via INTRAVENOUS

## 2016-12-21 MED ORDER — HYDROMORPHONE 1 MG/ML IV SOLN
INTRAVENOUS | Status: DC
  Filled 2016-12-21: qty 25

## 2016-12-21 MED ORDER — MIDAZOLAM HCL 2 MG/2ML IJ SOLN
INTRAMUSCULAR | Status: AC
  Filled 2016-12-21: qty 2

## 2016-12-21 MED ORDER — HYDROMORPHONE HCL 1 MG/ML IJ SOLN
2.0000 mg | Freq: Once | INTRAMUSCULAR | Status: AC
  Administered 2016-12-21: 2 mg via INTRAVENOUS
  Filled 2016-12-21: qty 2

## 2016-12-21 MED ORDER — KETAMINE HCL 10 MG/ML IJ SOLN
INTRAMUSCULAR | Status: DC | PRN
  Administered 2016-12-21: 60 mg via INTRAVENOUS
  Administered 2016-12-21: 20 mg via INTRAVENOUS

## 2016-12-21 MED ORDER — ONDANSETRON HCL 4 MG/2ML IJ SOLN
INTRAMUSCULAR | Status: AC
  Filled 2016-12-21: qty 2

## 2016-12-21 MED ORDER — FENTANYL CITRATE (PF) 100 MCG/2ML IJ SOLN
INTRAMUSCULAR | Status: AC
  Filled 2016-12-21: qty 2

## 2016-12-21 MED ORDER — MIDAZOLAM HCL 5 MG/5ML IJ SOLN
INTRAMUSCULAR | Status: DC | PRN
  Administered 2016-12-21: 2 mg via INTRAVENOUS

## 2016-12-21 MED ORDER — BUPIVACAINE HCL 0.5 % IJ SOLN
INTRAMUSCULAR | Status: DC | PRN
  Administered 2016-12-21: 1 mL

## 2016-12-21 MED ORDER — PHENYLEPHRINE HCL 10 MG/ML IJ SOLN
INTRAMUSCULAR | Status: AC
  Filled 2016-12-21: qty 1

## 2016-12-21 MED ORDER — PHENYLEPHRINE 40 MCG/ML (10ML) SYRINGE FOR IV PUSH (FOR BLOOD PRESSURE SUPPORT)
PREFILLED_SYRINGE | INTRAVENOUS | Status: AC
  Filled 2016-12-21: qty 20

## 2016-12-21 MED ORDER — EPHEDRINE SULFATE-NACL 50-0.9 MG/10ML-% IV SOSY
PREFILLED_SYRINGE | INTRAVENOUS | Status: DC | PRN
Start: 2016-12-21 — End: 2016-12-21
  Administered 2016-12-21 (×2): 10 mg via INTRAVENOUS

## 2016-12-21 SURGICAL SUPPLY — 90 items
APL SKNCLS STERI-STRIP NONHPOA (GAUZE/BANDAGES/DRESSINGS) ×1
APPLIER CLIP 5 13 M/L LIGAMAX5 (MISCELLANEOUS)
APPLIER CLIP ROT 13.4 12 LRG (CLIP)
APR CLP LRG 13.4X12 ROT 20 MLT (CLIP)
APR CLP MED LRG 5 ANG JAW (MISCELLANEOUS)
BAG LAPAROSCOPIC 12 15 PORT 16 (BASKET) ×1 IMPLANT
BAG RETRIEVAL 12/15 (BASKET) ×2
BAG SPEC RTRVL LRG 6X4 10 (ENDOMECHANICALS)
BENZOIN TINCTURE PRP APPL 2/3 (GAUZE/BANDAGES/DRESSINGS) ×1 IMPLANT
BLADE EXTENDED COATED 6.5IN (ELECTRODE) ×1 IMPLANT
BLADE HEX COATED 2.75 (ELECTRODE) IMPLANT
CABLE HIGH FREQUENCY MONO STRZ (ELECTRODE) ×2 IMPLANT
CHLORAPREP W/TINT 26ML (MISCELLANEOUS) ×2 IMPLANT
CLIP APPLIE 5 13 M/L LIGAMAX5 (MISCELLANEOUS) IMPLANT
CLIP APPLIE ROT 13.4 12 LRG (CLIP) IMPLANT
CLIP TI LARGE 6 (CLIP) ×2 IMPLANT
CLIP TI MEDIUM 6 (CLIP) ×1 IMPLANT
CONNECTOR 5 IN 1 STRAIGHT STRL (MISCELLANEOUS) ×1 IMPLANT
CUTTER FLEX LINEAR 45M (STAPLE) ×1 IMPLANT
DECANTER SPIKE VIAL GLASS SM (MISCELLANEOUS) ×2 IMPLANT
DISSECTOR BLUNT TIP ENDO 5MM (MISCELLANEOUS) IMPLANT
DRAIN CHANNEL 19F RND (DRAIN) ×1 IMPLANT
DRAPE UTILITY XL STRL (DRAPES) ×2 IMPLANT
ELECT PENCIL ROCKER SW 15FT (MISCELLANEOUS) ×1 IMPLANT
ELECT REM PT RETURN 15FT ADLT (MISCELLANEOUS) ×2 IMPLANT
EVACUATOR SILICONE 100CC (DRAIN) ×1 IMPLANT
GAUZE SPONGE 4X4 12PLY STRL (GAUZE/BANDAGES/DRESSINGS) ×1 IMPLANT
GLOVE BIOGEL PI IND STRL 7.0 (GLOVE) ×1 IMPLANT
GLOVE BIOGEL PI INDICATOR 7.0 (GLOVE) ×1
GLOVE ECLIPSE 8.0 STRL XLNG CF (GLOVE) ×2 IMPLANT
GLOVE INDICATOR 8.0 STRL GRN (GLOVE) ×4 IMPLANT
GOWN STRL REUS W/TWL LRG LVL3 (GOWN DISPOSABLE) ×2 IMPLANT
GOWN STRL REUS W/TWL XL LVL3 (GOWN DISPOSABLE) ×6 IMPLANT
HANDLE STAPLE EGIA 4 XL (STAPLE) ×2 IMPLANT
HANDLE SUCTION POOLE (INSTRUMENTS) IMPLANT
HEMOSTAT SURGICEL 4X8 (HEMOSTASIS) ×2 IMPLANT
IRRIG SUCT STRYKERFLOW 2 WTIP (MISCELLANEOUS)
IRRIGATION SUCT STRKRFLW 2 WTP (MISCELLANEOUS) IMPLANT
KIT BASIN OR (CUSTOM PROCEDURE TRAY) ×2 IMPLANT
LIGASURE IMPACT 36 18CM CVD LR (INSTRUMENTS) ×1 IMPLANT
NS IRRIG 1000ML POUR BTL (IV SOLUTION) ×2 IMPLANT
POSITIONER SURGICAL ARM (MISCELLANEOUS) IMPLANT
POUCH SPECIMEN RETRIEVAL 10MM (ENDOMECHANICALS) IMPLANT
RELOAD 45 VASCULAR/THIN (ENDOMECHANICALS) ×6 IMPLANT
RELOAD EGIA 45 MED/THCK PURPLE (STAPLE) IMPLANT
RELOAD EGIA 45 TAN VASC (STAPLE) IMPLANT
RELOAD EGIA 60 MED/THCK PURPLE (STAPLE) IMPLANT
RELOAD EGIA 60 TAN VASC (STAPLE) IMPLANT
RELOAD STAPLE 45 2.5 WHT GRN (ENDOMECHANICALS) IMPLANT
RELOAD STAPLE 60 MED/THCK ART (STAPLE) IMPLANT
SCISSORS LAP 5X35 DISP (ENDOMECHANICALS) ×2 IMPLANT
SET IRRIG TUBING LAPAROSCOPIC (IRRIGATION / IRRIGATOR) ×2 IMPLANT
SET TUBE IRRIG SUCTION NO TIP (IRRIGATION / IRRIGATOR) IMPLANT
SHEARS CURVED HARMONIC AC 45CM (MISCELLANEOUS) ×1 IMPLANT
SHEARS HARMONIC ACE PLUS 36CM (ENDOMECHANICALS) ×2 IMPLANT
SLEEVE XCEL OPT CAN 5 100 (ENDOMECHANICALS) ×3 IMPLANT
SOLUTION ANTI FOG 6CC (MISCELLANEOUS) ×2 IMPLANT
SPONGE LAP 18X18 X RAY DECT (DISPOSABLE) ×2 IMPLANT
STAPLER VISISTAT 35W (STAPLE) ×1 IMPLANT
STRIP CLOSURE SKIN 1/2X4 (GAUZE/BANDAGES/DRESSINGS) ×1 IMPLANT
SUCTION POOLE HANDLE (INSTRUMENTS)
SUCTION POOLE TIP (SUCTIONS) ×1 IMPLANT
SUT ETHILON 3 0 PS 1 (SUTURE) ×1 IMPLANT
SUT MNCRL AB 4-0 PS2 18 (SUTURE) ×2 IMPLANT
SUT PDS AB 1 CTX 36 (SUTURE) ×4 IMPLANT
SUT SILK 2 0 (SUTURE) ×2
SUT SILK 2 0 SH CR/8 (SUTURE) ×1 IMPLANT
SUT SILK 2 0SH CR/8 30 (SUTURE) ×2 IMPLANT
SUT SILK 2-0 30XBRD TIE 12 (SUTURE) IMPLANT
SUT VIC AB 4-0 PS2 27 (SUTURE) IMPLANT
SUT VICRYL 2 0 18  UND BR (SUTURE)
SUT VICRYL 2 0 18 UND BR (SUTURE) IMPLANT
TAPE CLOTH 2X10 TAN LF (GAUZE/BANDAGES/DRESSINGS) ×1 IMPLANT
TAPE CLOTH 4X10 WHT NS (GAUZE/BANDAGES/DRESSINGS) ×1 IMPLANT
TOWEL OR 17X26 10 PK STRL BLUE (TOWEL DISPOSABLE) ×4 IMPLANT
TOWEL OR NON WOVEN STRL DISP B (DISPOSABLE) ×2 IMPLANT
TRAY FOLEY CATH 14FRSI W/METER (CATHETERS) IMPLANT
TRAY FOLEY W/METER SILVER 16FR (SET/KITS/TRAYS/PACK) ×1 IMPLANT
TRAY LAPAROSCOPIC (CUSTOM PROCEDURE TRAY) ×2 IMPLANT
TROCAR BLADELESS OPT 12M 100M (ENDOMECHANICALS) ×1 IMPLANT
TROCAR BLADELESS OPT 5 100 (ENDOMECHANICALS) ×1 IMPLANT
TROCAR XCEL 12X100 BLDLESS (ENDOMECHANICALS) ×2 IMPLANT
TROCAR XCEL BLUNT TIP 100MML (ENDOMECHANICALS) ×2 IMPLANT
TROCAR XCEL NON-BLD 11X100MML (ENDOMECHANICALS) IMPLANT
TROCAR XCEL NON-BLD 5MMX100MML (ENDOMECHANICALS) ×2 IMPLANT
TROCAR XCEL UNIV SLVE 11M 100M (ENDOMECHANICALS) IMPLANT
TUBING CONNECTING 10 (TUBING) ×1 IMPLANT
TUBING INSUF HEATED (TUBING) ×2 IMPLANT
YANKAUER SUCT BULB TIP 10FT TU (MISCELLANEOUS) ×2 IMPLANT
YANKAUER SUCT BULB TIP NO VENT (SUCTIONS) IMPLANT

## 2016-12-21 NOTE — Anesthesia Postprocedure Evaluation (Signed)
Anesthesia Post Note  Patient: Samuel Huffman  Procedure(s) Performed: Procedure(s) (LRB): LAPAROSCOPY LYSIS OF ADHESIONS, EXPLORATORY LAPAROTOMY, SPLENECTOMY (N/A)     Patient location during evaluation: PACU Anesthesia Type: General Level of consciousness: awake and alert Pain management: pain level not controlled Vital Signs Assessment: post-procedure vital signs reviewed and stable Respiratory status: spontaneous breathing, nonlabored ventilation, respiratory function stable and patient connected to nasal cannula oxygen Cardiovascular status: stable and tachycardic Postop Assessment: no signs of nausea or vomiting Anesthetic complications: no    Last Vitals:  Vitals:   12/21/16 1800 12/21/16 1830  BP: (!) 164/111 (!) 154/84  Pulse: (!) 124 (!) 123  Resp: (!) 28   Temp: 36.3 C 36.3 C    Last Pain:  Vitals:   12/21/16 1845  TempSrc: Oral  PainSc:                  Kennieth RadFitzgerald, Analisa Sledd E

## 2016-12-21 NOTE — Transfer of Care (Signed)
Immediate Anesthesia Transfer of Care Note  Patient: Samuel Huffman  Procedure(s) Performed: Procedure(s): LAPAROSCOPY LYSIS OF ADHESIONS, EXPLORATORY LAPAROTOMY, SPLENECTOMY (N/A)  Patient Location: ICU  Anesthesia Type:General  Level of Consciousness: sedated and responds to stimulation  Airway & Oxygen Therapy: Patient connected to face mask oxygen  Post-op Assessment: Post -op Vital signs reviewed and stable and Patient moving all extremities X 4  Post vital signs: Reviewed and stable  Last Vitals:  Vitals:   12/20/16 2131 12/21/16 0645  BP: 138/81 132/62  Pulse: 61 (!) 57  Resp: 18 16  Temp: 36.5 C 36.6 C    Last Pain:  Vitals:   12/21/16 1017  TempSrc:   PainSc: 5       Patients Stated Pain Goal: 4 (12/21/16 0947)  Complications: No apparent anesthesia complications

## 2016-12-21 NOTE — Anesthesia Procedure Notes (Signed)
Arterial Line Insertion Start/End6/29/2018 4:20 PM, 12/21/2016 4:25 PM Performed by: Marcene DuosFITZGERALD, Annielee Jemmott, anesthesiologist  Patient location: Pre-op. Preanesthetic checklist: patient identified, IV checked, site marked, risks and benefits discussed, surgical consent, monitors and equipment checked, pre-op evaluation, timeout performed and anesthesia consent Lidocaine 1% used for infiltration Right, radial was placed Catheter size: 20 Fr Hand hygiene performed  and maximum sterile barriers used   Attempts: 1 Procedure performed without using ultrasound guided technique. Following insertion, dressing applied and Biopatch. Post procedure assessment: normal and unchanged  Patient tolerated the procedure well with no immediate complications.

## 2016-12-21 NOTE — Progress Notes (Signed)
   12/21/16 1105  Clinical Encounter Type  Visited With Patient  Visit Type Initial;Pre-op  Referral From Chaplain  Consult/Referral To Chaplain   Chaplain stopped by to visit patient while rounding.  Family at bedside.  Patient says he is having surgery later today and hopes to start recovering once surgery has been done.  Bone pillow given to patient.  Patient appreciative of Chaplain presence.

## 2016-12-21 NOTE — Progress Notes (Signed)
PROGRESS NOTE  KARION CUDD ZOX:[id number] DOB: 11-Jun-1966 DOA: 12/18/2016 PCP: Patient, No Pcp Per  HPI/Recap of past 24 hours:  He does not report significant pain to me or to general surgery, though he still require prn dilaudid four times since midnight  no overt bleeding, no fever, he is ready to have surgery, family at bedside.  Assessment/Plan: Principal Problem:   Hematemesis Active Problems:   History of alcohol abuse   Upper GI bleed   Essential hypertension   GI bleed   Bleeding gastric varices   Hematemesis:  -He is admitted to icu, he is intubated for egd on 6/26, now extubated, no active bleed --EGD:                "- Normal esophagus. No esophageal varices. - Type 1 isolated gastric varices (IGV1, varices located in the fundus), as the cause of bleeding without active bleeding high-risk stigmata. 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 He does has epigastric discomfort which he attribute to stomach bleed -lipase 71 on admission, lipase 15 on 6/28 -splenomectomy planned on 6/29, plan per  gi/general surgery.  Early cirrhosis -There is at least radiological evidence that he has early cirrhosis on CT scan done in February. -patient report has stopped drinking alcohol -Per GI. Has negative hepatitis C. -Patient denies histroy of cirrhosis, he seems to be irritated about this diagnosis  Elevated blood sugar -He was  not fasting, CBG was 194.  -hemoglobin A1c 6, but may not be accurate in the setting of acute anemia, will need repeat a1c at hospital follow up   Code Status: full  Family Communication: patient and family at bedside  Disposition Plan: pending   Consultants:  GI  General surgery  Procedures:  Intubation and extubation on 6/26  egd on 6/26  splenectomy on 6/29  Antibiotics:  Rocephin from admission to 6/27   Objective: BP 132/62 (BP Location: Right Arm)   Pulse (!) 57   Temp 97.8 F (36.6 C) (Oral)   Resp 16   Ht 6\' 2"  (1.88 m)   Wt 89.9 kg (198 lb 4.8 oz)   SpO2 100%   BMI 25.46 kg/m   Intake/Output Summary (Last 24 hours) at 12/21/16 0736 Last data filed at 12/21/16 0200  Gross per 24 hour  Intake           852.08 ml  Output             2625 ml  Net         -1772.92 ml   Filed Weights   12/19/16 0500 12/20/16 0503 12/21/16 0643  Weight: 90.3 kg (199 lb 1.2 oz) 92.3 kg (203 lb 8 oz) 89.9 kg (198 lb 4.8 oz)    Exam:   General:  NAD  Cardiovascular: RRR  Respiratory: CTABL  Abdomen: mild epigastric tender, no guarding, no rebound, Soft/NT, positive BS  Musculoskeletal: No Edema  Neuro: aaox3  Data Reviewed: Basic Metabolic Panel:  Recent  Labs Lab 12/18/16 0020 12/19/16 0236 12/20/16 0159 12/21/16 0453  NA 135 136 135 137  K 3.7 3.8 3.5 3.5  CL 99* 102 103 100*  CO2 26 28 25 29   GLUCOSE 194* 131* 100* 125*  BUN 20 18 11 7   CREATININE 0.75 0.78 0.82 0.72  CALCIUM 9.0 8.1* 8.1* 8.7*  MG  --  1.8 1.8 1.8  PHOS  --  2.5  --   --    Liver Function Tests:  Recent Labs Lab 12/18/16 0020 12/20/16 0159  AST 22 18  ALT 13* 11*  ALKPHOS 63 47  BILITOT 0.8 0.6  PROT 7.6 5.8*  ALBUMIN 4.2 3.3*    Recent Labs Lab 12/18/16 0020 12/20/16 0159  LIPASE 71* 15   No results for input(s): AMMONIA in the last 168 hours. CBC:  Recent Labs Lab 12/18/16 0020  12/19/16 0817 12/19/16 1442 12/19/16 1948 12/20/16 0159  12/21/16 0453  WBC 8.5  < > 4.0 3.5* 3.5* 3.1* 3.1*  NEUTROABS 6.5  --   --   --   --   --  1.8  HGB 9.1*  < > 7.8* 7.8* 7.7* 7.4* 7.6*  HCT 27.7*  < > 23.7* 24.2* 23.2* 22.0* 23.3*  MCV 100.7*  < > 95.6 96.0 94.7 94.8 95.9  PLT 313  < > 139* 149* 131* 131* 164  < > = values in this interval not displayed. Cardiac Enzymes:   No results for input(s): CKTOTAL, CKMB, CKMBINDEX, TROPONINI in the last 168 hours. BNP (last 3 results) No results for input(s): BNP in the last 8760 hours.  ProBNP (last 3 results) No results for input(s): PROBNP in the last 8760 hours.  CBG: No results for input(s): GLUCAP in the last 168 hours.  Recent Results (from the past 240 hour(s))  MRSA PCR Screening     Status: None   Collection Time: 12/18/16  1:52 PM  Result Value Ref Range Status   MRSA by PCR NEGATIVE NEGATIVE Final    Comment:        The GeneXpert MRSA Assay (FDA approved for NASAL specimens only), is one component of a comprehensive MRSA colonization surveillance program. It is not intended to diagnose MRSA infection nor to guide or monitor treatment for MRSA infections.      Studies: No results found.  Scheduled Meds: . pantoprazole  40 mg Intravenous Q12H  . sodium chloride flush  3 mL Intravenous Q12H    Continuous Infusions: . sodium chloride    . sodium chloride 50 mL/hr at 12/21/16 0644  .  ceFAZolin (ANCEF) IV       Time spent: 25mins  Cillian Gwinner MD, PhD  [facility] Pager [phone]. If 7PM-7AM, please contact night-coverage at [email or web address], password [email or web address] 12/21/2016, 7:36 AM  LOS: 3 days

## 2016-12-21 NOTE — Op Note (Signed)
Operative Note  Samuel Huffman male 51 y.o. 12/21/2016  PREOPERATIVE DX:  Bleeding gastric varices from splenic vein thrombosis  POSTOPERATIVE DX:  Same  PROCEDURE:   Laparoscopic lysis of adhesions for1 hour. Exploratory laparotomy and open splenectomy.         Surgeon: Adolph Pollack   Assistants: Gaynelle Adu M.D.  Anesthesia: General endotracheal anesthesia  Indications:   This is a 51 year old male who is had recurrent bouts of pancreatitis with pseudocysts. He has splenic vein thrombosis and has developed bleeding gastric varices. He now presents for splenectomy.    Procedure Detail:  He was brought to the operating room placed supine on the operating table and general anesthetic was given. A Foley catheter was inserted. He was placed with the left side rotated up or 2. An oral gastric tube was placed. The hair on the abdominal Brailsford was clipped. The abdominal sterilely prepped and draped.  He was turned into the supine position. A small incision was made on the umbilicus to the skin and subcutaneous tissue, fascia and peritoneum. Peritoneal cavity was entered under direct vision. Pursestring suture of 0 Vicryl splices around the edge of the fascia. A Hassan trochars in addition to the peritoneal cavity pneumoperitoneum created by insufflation of CO2 gas.  Laparoscope was introduced next no evidence of organ injury or bleeding. 13 is noted between the liver and anterior abdominal [place]jo. Adhesions noted between the omentum and left upper quadrant abdominal Droll and stomach. A 5 mm trochars placed in the subxiphoid area. A 5 mm trocar was placed in the left mid abdomen and one in the left upper quadrant area. Began by mobilizing some adhesions between the omentum and the left upper quadrant abdominal Casten. This allowed for visualization the lateral aspect of the spleen. The omentum and what appeared to be pseudocysts were densely adherent to the splenic hilum. I added a 5 mm trocar to  the midline area.  I began to free the lateral spleen from the diaphragmatic attachments and inferior spleen from the colonic attachments using the Harmonic scalpel. I tried again into the lesser sac but there was so much fat saponification and scarring was not able to. I mobilized as much of the spleen as I could in the left upper quadrant le freeing it up from the diaphragm as much as possible. After an hour of lysing adhesions and try to mobilize the spleen felt it would not be able to be removed laparoscopically.  Thus, I made a left subcostal incision through the skin, subcutaneous, fascial layers and peritoneum entering the peritoneal cavity. I mobilized the spleen free from the diaphragm by dividing the adhesions. I then began trying to separate the pancreas pseudocyst from the spleen and cut into 1 pseudocyst and drained it. The stomach was densely adherent to the spleen and I separated this with some blunt and sharp dissection. Short gastric vessels were found and bled easily. These were controlled with clamps and hemoclips. There is a continuous ooze of blood during the entire procedure. I was able to try to approach the splenic hilum but the pancreatic pseudocyst and tail of pancreas were densely adherent to it. Thus I used a vascular stapler and staple across part of the spleen and part of the tail of pancreas leaving some of the spleen behind. I did remove remove the majority of the spleen and was sent to the lab.  The bleeding points from the pancreatic tail staple line which I controlled with silk suture. I controlled  bleeding from some of the short gastric vessels with the silk sutures. I controlled bleeding from the small remnant of the spleen with the electrocautery.  I inspected the stomach carefully and did not notice any injury to the stomach where evidence of leak. A nasogastric tube was placed in the orogastric tube removed. I then copiously irrigated out the left upper quadrant and  inspected again. There is no evidence of intestinal injury. Surgicel was placed in the left upper quadrant. I then placed 219 Blake drains through stab incisions into the left upper quadrant area. They're sewn to the skin with 3-0 nylon suture. Trochars were removed. Subumbilical fascial defect was closed by tightening up and tying down the pursestring suture.  The fascia of the left subcostal incision was closed in 2 layers with running #1 PDS suture. Subcutaneous tissues irrigated. Skin of the trocar incisions were closed with 4-0 Monocryl subcuticular stitches. Skin of the left subcostal incision was closed with staples. Sterile dressings were applied.  He tolerated the procedure fairly well on a pair complications. Estimated blood loss was 3000 cc. He received 5 units of packed red blood cells, 5 units of plasma, and 1 sixpack of platelets. He will be taken to the recovery room and then to the ICU. He was in stable condition. I do believe he is at high risk for a leak from the pancreatic tail and here I discussed this preoperatively.  He will need to be given his vaccinations prior to leaving the hospital.   Estimated Blood Loss:  3000 ml         Drains: Two #19 Blake drains                 Specimens: Spleen        Complications:  * No complications entered in OR log *         Disposition: PACU - hemodynamically stable.         Condition: stable    2

## 2016-12-21 NOTE — Anesthesia Preprocedure Evaluation (Signed)
Anesthesia Evaluation  Patient identified by MRN, date of birth, ID band Patient awake    Reviewed: Allergy & Precautions, NPO status , Patient's Chart, lab work & pertinent test results  Airway Mallampati: II  TM Distance: >3 FB Neck ROM: Full    Dental  (+) Dental Advisory Given   Pulmonary Current Smoker,    breath sounds clear to auscultation       Cardiovascular negative cardio ROS   Rhythm:Regular Rate:Normal     Neuro/Psych negative neurological ROS     GI/Hepatic hiatal hernia, GERD  ,(+) Cirrhosis       , Splenic vein thrombosis.   Endo/Other  negative endocrine ROS  Renal/GU Renal disease     Musculoskeletal   Abdominal   Peds  Hematology  (+) anemia ,   Anesthesia Other Findings   Reproductive/Obstetrics                             Lab Results  Component Value Date   WBC 3.1 (L) 12/21/2016   HGB 7.6 (L) 12/21/2016   HCT 23.3 (L) 12/21/2016   MCV 95.9 12/21/2016   PLT 164 12/21/2016   Lab Results  Component Value Date   CREATININE 0.72 12/21/2016   BUN 7 12/21/2016   NA 137 12/21/2016   K 3.5 12/21/2016   CL 100 (L) 12/21/2016   CO2 29 12/21/2016    Anesthesia Physical Anesthesia Plan  ASA: III  Anesthesia Plan: General   Post-op Pain Management:    Induction: Intravenous  PONV Risk Score and Plan: 2 and Ondansetron and Dexamethasone  Airway Management Planned: Oral ETT  Additional Equipment:   Intra-op Plan:   Post-operative Plan: Extubation in OR  Informed Consent: I have reviewed the patients History and Physical, chart, labs and discussed the procedure including the risks, benefits and alternatives for the proposed anesthesia with the patient or authorized representative who has indicated his/her understanding and acceptance.   Dental advisory given  Plan Discussed with: CRNA  Anesthesia Plan Comments:         Anesthesia Quick  Evaluation

## 2016-12-21 NOTE — Progress Notes (Signed)
[facility] Progress Note  3 Days Post-Op  Subjective: CC: No complaints Patient without further bleeding, no n/v. Chronic pain, but no acute worsening. Last meal around 7 PM last night.  UOP good. VSS.   Objective: Vital signs in last 24 hours: Temp:  [97.7 F (36.5 C)-97.8 F (36.6 C)] 97.8 F (36.6 C) (06/29 0645) Pulse Rate:  [57-61] 57 (06/29 0645) Resp:  [16-18] 16 (06/29 0645) BP: (131-138)/(62-81) 132/62 (06/29 0645) SpO2:  [100 %] 100 % (06/29 0645) Weight:  [89.9 kg (198 lb 4.8 oz)] 89.9 kg (198 lb 4.8 oz) (06/29 0643) Last BM Date: 12/17/16  Intake/Output from previous day: 06/28 0701 - 06/29 0700 In: 852.1 [I.V.:852.1] Out: 2625 [Urine:2625] Intake/Output this shift: No intake/output data recorded.  PE: Gen:  Alert, NAD, pleasant Card:  Regular rate and rhythm, no M/G/R appreciated Pulm:  Normal effort, clear to auscultation bilaterally Abd: Soft, non-tender, non-distended, bowel sounds present in all 4 quadrants. Skin: warm and dry, no rashes  Psych: A&Ox3   Lab Results:   Recent Labs  12/20/16 0159 12/21/16 0453  WBC 3.1* 3.1*  HGB 7.4* 7.6*  HCT 22.0* 23.3*  PLT 131* 164   BMET  Recent Labs  12/20/16 0159 12/21/16 0453  NA 135 137  K 3.5 3.5  CL 103 100*  CO2 25 29  GLUCOSE 100* 125*  BUN 11 7  CREATININE 0.82 0.72  CALCIUM 8.1* 8.7*   PT/INR  Recent Labs  12/18/16 1424  LABPROT 13.7  INR 1.05   CMP     Component Value Date/Time   NA 137 12/21/2016 0453   K 3.5 12/21/2016 0453   CL 100 (L) 12/21/2016 0453   CO2 29 12/21/2016 0453   GLUCOSE 125 (H) 12/21/2016 0453   BUN 7 12/21/2016 0453   CREATININE 0.72 12/21/2016 0453   CALCIUM 8.7 (L) 12/21/2016 0453   PROT 5.8 (L) 12/20/2016 0159   ALBUMIN 3.3 (L) 12/20/2016 0159   AST 18 12/20/2016 0159   ALT 11 (L) 12/20/2016 0159   ALKPHOS 47 12/20/2016 0159   BILITOT 0.6 12/20/2016 0159   GFRNONAA >60 12/21/2016 0453   GFRAA >60 12/21/2016 0453   Lipase      Component Value Date/Time   LIPASE 15 12/20/2016 0159       Studies/Results: Ct Abdomen Pelvis W Contrast  Result Date: 12/19/2016 CLINICAL DATA:  Abdominal pain with vomiting and dark stools. History of esophageal varices, pancreatitis and questionable cirrhosis. EXAM: CT ABDOMEN AND PELVIS WITH CONTRAST TECHNIQUE: Multidetector CT imaging of the abdomen and pelvis was performed using the standard protocol following bolus administration of intravenous contrast. CONTRAST:  100 ml Isovue-300. COMPARISON:  CT 08/01/2016.  Ultrasound 08/02/2016. FINDINGS: Lower chest: Linear atelectasis or scarring at both lung bases, similar to previous study. No significant pleural or pericardial effusion. Hepatobiliary: Mild heterogeneity of the parenchyma without focal abnormality. No evidence of gallstones, gallbladder Mcilwain thickening or biliary dilatation. Pancreas: Changes of chronic calcific pancreatitis are again noted with pancreatic atrophy. No pancreatic ductal dilatation or enlarging peripancreatic fluid collection. Previously demonstrated lesser sac and splenic hilum pseudocysts have decreased in size. Residual collection along the posterior Paluch of the proximal stomach measures up to 2.4 cm on sagittal image 95. Spleen: Stable in size without focal intrinsic abnormality. There is a subcapsular fluid collection along the superior aspect of the spleen which is lenticular in shape, measuring 4.7 x 1.9 x 4.8 cm. Adrenals/Urinary Tract: Both adrenal glands appear normal. No suspicious renal findings.  Probable tiny right renal cysts. No evidence of urinary tract calculus or hydronephrosis. The bladder appears unremarkable. Stomach/Bowel: The previously demonstrated diffuse gastric and proximal duodenal Mcvey thickening has improved. There is still some Kole thickening at the gastric fundus and duodenal bulb. There is some soft tissue stranding around the duodenal bulb. The remainder of the small bowel, appendix and  colon demonstrate no significant findings. Vascular/Lymphatic: There are no enlarged abdominal or pelvic lymph nodes. Mild aortic and branch vessel atherosclerosis. There is chronic occlusion of the splenic vein. The portal and superior mesenteric veins are patent. There are multiple perigastric collateral veins. Reproductive: Stable mild enlargement of the prostate gland. The seminal vesicles appear normal. Other: No ascites or free air. Musculoskeletal: No acute or significant osseous findings. Old rib fractures on the left and postsurgical changes within the proximal left femur are noted. IMPRESSION: 1. No clear explanation for GI bleeding or acute findings demonstrated. 2. Sequela of chronic pancreatitis are again demonstrated with retroperitoneal inflammation and several fluctuating pseudocysts. Most of the cysts are improved from the prior study, although there is a new subcapsular one along the superior aspect of the spleen. 3. Previously demonstrated gastric and duodenal Territo thickening are improved. 4. 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 Electronically Signed   By: Carey BullocksWilliam  Veazey M.D.   On: 12/19/2016 14:59    Anti-infectives: Anti-infectives    Start     Dose/Rate Route Frequency Ordered Stop   12/21/16 0600  ceFAZolin (ANCEF) IVPB 2g/100 mL premix     2 g 200 mL/hr over 30 Minutes Intravenous  Once 12/20/16 0837     12/19/16 0400  cefTRIAXone (ROCEPHIN) 2 g in dextrose 5 % 50 mL IVPB  Status:  Discontinued     2 g 100 mL/hr over 30 Minutes Intravenous Every 24 hours 12/18/16 1351 12/19/16 1528   12/18/16 0415  cefTRIAXone (ROCEPHIN) 1 g in dextrose 5 % 50 mL IVPB     1 g 100 mL/hr over 30 Minutes Intravenous  Once 12/18/16 0408 12/18/16 0536       Assessment/Plan Bleeding gastric varices - no bleeding overnight - H/H 7.6/23.3, stable.  - likely due to below Splenic vein thrombosis Chronic pancreatitis with pseudocysts  FEN - NPO,  IVF VTE - SCDs ID - Rocephin (6/26 >6/27). Ancef for surgical prophylaxis  Plan: OR today for splenectomy.   LOS: 3 days    Wells GuilesKelly Rayburn , [facility] 12/21/2016, 8:25 AM Pager: [phone] Consults: [phone] Mon-Fri 7:00 am-4:30 pm Sat-Sun 7:00 am-11:30 am

## 2016-12-21 NOTE — Anesthesia Procedure Notes (Signed)
Procedure Name: Intubation Date/Time: 12/21/2016 2:00 PM Performed by: Montel Clock Pre-anesthesia Checklist: Patient identified, Emergency Drugs available, Suction available, Patient being monitored and Timeout performed Patient Re-evaluated:Patient Re-evaluated prior to inductionOxygen Delivery Method: Circle system utilized Preoxygenation: Pre-oxygenation with 100% oxygen Intubation Type: IV induction and Rapid sequence Laryngoscope Size: Mac and 3 Grade View: Grade I Tube type: Oral Tube size: 7.5 mm Number of attempts: 1 Airway Equipment and Method: Stylet Placement Confirmation: ETT inserted through vocal cords under direct vision,  positive ETCO2 and breath sounds checked- equal and bilateral Secured at: 23 cm Tube secured with: Tape Dental Injury: Teeth and Oropharynx as per pre-operative assessment

## 2016-12-22 DIAGNOSIS — D500 Iron deficiency anemia secondary to blood loss (chronic): Secondary | ICD-10-CM

## 2016-12-22 DIAGNOSIS — Z9081 Acquired absence of spleen: Secondary | ICD-10-CM

## 2016-12-22 LAB — CBC
HCT: 24.4 % — ABNORMAL LOW (ref 39.0–52.0)
Hemoglobin: 8.5 g/dL — ABNORMAL LOW (ref 13.0–17.0)
MCH: 31.1 pg (ref 26.0–34.0)
MCHC: 34.8 g/dL (ref 30.0–36.0)
MCV: 89.4 fL (ref 78.0–100.0)
PLATELETS: 234 10*3/uL (ref 150–400)
RBC: 2.73 MIL/uL — AB (ref 4.22–5.81)
RDW: 17.2 % — AB (ref 11.5–15.5)
WBC: 13.5 10*3/uL — AB (ref 4.0–10.5)

## 2016-12-22 LAB — BASIC METABOLIC PANEL
Anion gap: 10 (ref 5–15)
BUN: 8 mg/dL (ref 6–20)
CALCIUM: 8.5 mg/dL — AB (ref 8.9–10.3)
CO2: 25 mmol/L (ref 22–32)
CREATININE: 0.7 mg/dL (ref 0.61–1.24)
Chloride: 101 mmol/L (ref 101–111)
Glucose, Bld: 117 mg/dL — ABNORMAL HIGH (ref 65–99)
Potassium: 3.9 mmol/L (ref 3.5–5.1)
SODIUM: 136 mmol/L (ref 135–145)

## 2016-12-22 LAB — BPAM PLATELET PHERESIS
BLOOD PRODUCT EXPIRATION DATE: 201807012359
ISSUE DATE / TIME: 201806292330
Unit Type and Rh: 5100

## 2016-12-22 LAB — TYPE AND SCREEN
ABO/RH(D): O POS
Antibody Screen: NEGATIVE
Unit division: 0
Unit division: 0
Unit division: 0
Unit division: 0
Unit division: 0
Unit division: 0
Unit division: 0
Unit division: 0
Unit division: 0
Unit division: 0

## 2016-12-22 LAB — PREPARE FRESH FROZEN PLASMA
UNIT DIVISION: 0
UNIT DIVISION: 0
Unit division: 0
Unit division: 0

## 2016-12-22 LAB — PREPARE PLATELET PHERESIS: Unit division: 0

## 2016-12-22 LAB — BPAM FFP
BLOOD PRODUCT EXPIRATION DATE: 201807042359
BLOOD PRODUCT EXPIRATION DATE: 201807042359
BLOOD PRODUCT EXPIRATION DATE: 201807042359
Blood Product Expiration Date: 201807042359
ISSUE DATE / TIME: 201806291825
ISSUE DATE / TIME: 201806291651
ISSUE DATE / TIME: 201806291651
ISSUE DATE / TIME: 201806292106
UNIT TYPE AND RH: 5100
UNIT TYPE AND RH: 5100
Unit Type and Rh: 5100
Unit Type and Rh: 5100

## 2016-12-22 LAB — BPAM RBC
BLOOD PRODUCT EXPIRATION DATE: 201807242359
BLOOD PRODUCT EXPIRATION DATE: 201807262359
BLOOD PRODUCT EXPIRATION DATE: 201807262359
BLOOD PRODUCT EXPIRATION DATE: 201807262359
BLOOD PRODUCT EXPIRATION DATE: 201807262359
Blood Product Expiration Date: 201807252359
Blood Product Expiration Date: 201807262359
Blood Product Expiration Date: 201807262359
Blood Product Expiration Date: 201807262359
Blood Product Expiration Date: 201807262359
ISSUE DATE / TIME: 201806291327
ISSUE DATE / TIME: 201806291510
ISSUE DATE / TIME: 201806291609
ISSUE DATE / TIME: 201806291609
ISSUE DATE / TIME: 201806291640
ISSUE DATE / TIME: 201806262240
ISSUE DATE / TIME: 201806291510
ISSUE DATE / TIME: 201806291609
ISSUE DATE / TIME: 201806291609
ISSUE DATE / TIME: 201806291640
UNIT TYPE AND RH: 5100
UNIT TYPE AND RH: 5100
UNIT TYPE AND RH: 5100
UNIT TYPE AND RH: 5100
UNIT TYPE AND RH: 5100
UNIT TYPE AND RH: 5100
Unit Type and Rh: 5100
Unit Type and Rh: 5100
Unit Type and Rh: 5100
Unit Type and Rh: 5100

## 2016-12-22 MED ORDER — DIPHENHYDRAMINE HCL 12.5 MG/5ML PO ELIX: 12.5000 mg | ORAL_SOLUTION | Freq: Four times a day (QID) | ORAL | Status: DC | PRN

## 2016-12-22 MED ORDER — SODIUM CHLORIDE 0.9% FLUSH: 9.0000 mL | INTRAVENOUS | Status: DC | PRN

## 2016-12-22 MED ORDER — MORPHINE SULFATE (PF) 2 MG/ML IV SOLN
1.0000 mg | INTRAVENOUS | Status: DC | PRN
  Administered 2016-12-22: 4 mg via INTRAVENOUS
  Filled 2016-12-22: qty 2

## 2016-12-22 MED ORDER — KCL IN DEXTROSE-NACL 20-5-0.9 MEQ/L-%-% IV SOLN
INTRAVENOUS | Status: DC
  Administered 2016-12-22: 1000 mL via INTRAVENOUS
  Administered 2016-12-22 – 2016-12-25 (×6): via INTRAVENOUS
  Administered 2016-12-25: 100 mL/h via INTRAVENOUS
  Administered 2016-12-26 – 2016-12-28 (×3): via INTRAVENOUS
  Filled 2016-12-22 (×14): qty 1000

## 2016-12-22 MED ORDER — LACTATED RINGERS IV SOLN
INTRAVENOUS | Status: DC
  Administered 2016-12-22: 1000 mL via INTRAVENOUS

## 2016-12-22 MED ORDER — NALOXONE HCL 0.4 MG/ML IJ SOLN: 0.4000 mg | INTRAMUSCULAR | Status: DC | PRN

## 2016-12-22 MED ORDER — HYDROMORPHONE HCL 1 MG/ML IJ SOLN
1.0000 mg | Freq: Once | INTRAMUSCULAR | Status: AC
  Administered 2016-12-22: 1 mg via INTRAVENOUS
  Filled 2016-12-22: qty 1

## 2016-12-22 MED ORDER — ONDANSETRON HCL 4 MG/2ML IJ SOLN: 4.0000 mg | Freq: Four times a day (QID) | INTRAMUSCULAR | Status: DC | PRN

## 2016-12-22 MED ORDER — DIPHENHYDRAMINE HCL 50 MG/ML IJ SOLN
12.5000 mg | Freq: Four times a day (QID) | INTRAMUSCULAR | Status: DC | PRN
  Administered 2016-12-24 – 2016-12-26 (×4): 12.5 mg via INTRAVENOUS
  Filled 2016-12-22 (×5): qty 1

## 2016-12-22 MED ORDER — HYDROMORPHONE HCL 1 MG/ML IJ SOLN: 1.0000 mg | INTRAMUSCULAR | Status: DC | PRN

## 2016-12-22 MED ORDER — HYDROMORPHONE 1 MG/ML IV SOLN
INTRAVENOUS | Status: DC
  Administered 2016-12-22: 0.5 mg via INTRAVENOUS
  Administered 2016-12-22: 25 mg via INTRAVENOUS
  Administered 2016-12-22 (×2): 0.5 mg via INTRAVENOUS
  Administered 2016-12-22: 1.2 mg via INTRAVENOUS
  Administered 2016-12-23: 25 mg via INTRAVENOUS
  Administered 2016-12-23: 2.4 mg via INTRAVENOUS
  Administered 2016-12-24: 25 mg via INTRAVENOUS
  Administered 2016-12-24: 4.2 mg via INTRAVENOUS
  Administered 2016-12-24: 3.6 mg via INTRAVENOUS
  Administered 2016-12-24: 4.5 mg via INTRAVENOUS
  Administered 2016-12-24 (×2): 3 mg via INTRAVENOUS
  Administered 2016-12-24: 1.2 mg via INTRAVENOUS
  Administered 2016-12-25: 0.9 mg via INTRAVENOUS
  Administered 2016-12-26: 4.5 mg via INTRAVENOUS
  Administered 2016-12-26: 1.2 mg via INTRAVENOUS
  Administered 2016-12-26 (×2): 3.9 mg via INTRAVENOUS
  Administered 2016-12-26: 04:00:00 via INTRAVENOUS
  Administered 2016-12-27: 1.8 mg via INTRAVENOUS
  Administered 2016-12-27: 2.6 mg via INTRAVENOUS
  Administered 2016-12-27: 3.3 mg via INTRAVENOUS
  Administered 2016-12-27: 4.2 mg via INTRAVENOUS
  Administered 2016-12-27: 1.2 mg via INTRAVENOUS
  Administered 2016-12-28: 1.5 mg via INTRAVENOUS
  Filled 2016-12-22 (×5): qty 25

## 2016-12-22 NOTE — Progress Notes (Signed)
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 No esophageal varices. - Type 1 isolated gastric varices (IGV1, varices located in the fundus), as the cause of bleeding without active bleeding high-risk stigmata. 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   Acute blood loss anemia (on admission) Then post op anemia:  -inr 1.05, plt 149, hgb 6.6 on admission -S/p prbc transfusionx1units on 6/27 -He received 5 units of packed red blood cells, 5 units of plasma, and 1 sixpack of platelets during the surgery on 6/29   Early cirrhosis -There is at least radiological evidence that he has early cirrhosis on CT scan done in  February. -patient report has stopped drinking alcohol -Per GI. Has negative hepatitis C. -Patient denies histroy of cirrhosis, he seems to be irritated about this diagnosis  Elevated blood sugar -He was not fasting, CBG was 194.  -hemoglobin A1c 6, but may not be accurate in the setting of acute anemia, will need repeat a1c at hospital follow up   Code Status: full  Family Communication: patient   Disposition Plan: pending   Consultants:  GI  General surgery  Procedures:  Intubation and extubation on 6/26  egd on 6/26  splenectomy on 6/29  prbc /plt/plasma transfusion  Antibiotics:  Rocephin from admission to 6/27   Objective: BP 123/69   Pulse (!) 105   Temp 98 F (36.7 C) (Oral)   Resp 12   Ht 6\' 2"  (1.88 m)   Wt 91.3 kg (201 lb 4.5 oz)   SpO2 98%   BMI 25.84 kg/m   Intake/Output Summary (Last 24 hours) at 12/22/16 0721 Last data filed at 12/22/16 0600  Gross per 24 hour  Intake             6476 ml  Output             5079 ml  Net             1397 ml   Filed Weights   12/20/16 0503 12/21/16 0643 12/22/16 0500  Weight: 92.3 kg (203 lb 8 oz) 89.9 kg (198 lb 4.8 oz) 91.3  kg (201 lb 4.5 oz)    Exam:   General:  Does not look comfortable due to pain  Cardiovascular: RRR  Respiratory: CTABL  Abdomen: post op changes, dressing intact, jp drain bloody, incision pain, decreased BS  Musculoskeletal: No Edema  Neuro: aaox3  Data Reviewed: Basic Metabolic Panel:  Recent Labs Lab 12/18/16 0020 12/19/16 0236 12/20/16 0159 12/21/16 0453 12/21/16 1649 12/22/16 0400  NA 135 136 135 137 137 136  K 3.7 3.8 3.5 3.5 3.5 3.9  CL 99* 102 103 100*  --  101  CO2 26 28 25 29   --  25  GLUCOSE 194* 131* 100* 125*  --  117*  BUN 20 18 11 7   --  8  CREATININE 0.75 0.78 0.82 0.72  --  0.70  CALCIUM 9.0 8.1* 8.1* 8.7*  --  8.5*  MG  --  1.8 1.8 1.8  --   --   PHOS  --  2.5  --   --   --   --    Liver Function Tests:  Recent Labs Lab  12/18/16 0020 12/20/16 0159  AST 22 18  ALT 13* 11*  ALKPHOS 63 47  BILITOT 0.8 0.6  PROT 7.6 5.8*  ALBUMIN 4.2 3.3*    Recent Labs Lab 12/18/16 0020 12/20/16 0159  LIPASE 71* 15   No results for input(s): AMMONIA in the last 168 hours. CBC:  Recent Labs Lab 12/18/16 0020  12/19/16 0817 12/19/16 1442 12/19/16 1948 12/20/16 0159 12/21/16 0453 12/21/16 1649  WBC 8.5  < > 4.0 3.5* 3.5* 3.1* 3.1*  --   NEUTROABS 6.5  --   --   --   --   --  1.8  --   HGB 9.1*  < > 7.8* 7.8* 7.7* 7.4* 7.6* 8.2*  HCT 27.7*  < > 23.7* 24.2* 23.2* 22.0* 23.3* 24.0*  MCV 100.7*  < > 95.6 96.0 94.7 94.8 95.9  --   PLT 313  < > 139* 149* 131* 131* 164  --   < > = values in this interval not displayed. Cardiac Enzymes:   No results for input(s): CKTOTAL, CKMB, CKMBINDEX, TROPONINI in the last 168 hours. BNP (last 3 results) No results for input(s): BNP in the last 8760 hours.  ProBNP (last 3 results) No results for input(s): PROBNP in the last 8760 hours.  CBG: No results for input(s): GLUCAP in the last 168 hours.  Recent Results (from the past 240 hour(s))  MRSA PCR Screening     Status: None   Collection Time: 12/18/16  1:52 PM  Result Value Ref Range Status   MRSA by PCR NEGATIVE NEGATIVE Final    Comment:        The GeneXpert MRSA Assay (FDA approved for NASAL specimens only), is one component of a comprehensive MRSA colonization surveillance program. It is not intended to diagnose MRSA infection nor to guide or monitor treatment for MRSA infections.   MRSA PCR Screening     Status: None   Collection Time: 12/21/16  6:20 PM  Result Value Ref Range Status   MRSA by PCR NEGATIVE NEGATIVE Final    Comment:        The GeneXpert MRSA Assay (FDA approved for NASAL specimens only), is one component of a comprehensive MRSA colonization surveillance program. It is not intended to diagnose MRSA infection nor to guide or monitor treatment for MRSA infections.       Studies: No results found.  Scheduled Meds: .  HYDROmorphone (DILAUDID) injection  2 mg Intravenous Q3H  . pantoprazole  40 mg Intravenous Q12H  . sodium chloride flush  3 mL Intravenous Q12H    Continuous Infusions: . sodium chloride Stopped (12/21/16 1815)     Time spent: 35mins  Maisa Bedingfield MD, PhD  [facility] Pager [phone]. If 7PM-7AM, please contact night-coverage at [email or web address], password [email or web address] 12/22/2016, 7:21 AM  LOS: 4 days

## 2016-12-22 NOTE — Progress Notes (Signed)
1 Day Post-Op   Subjective/Chief Complaint: Complains of severe abdominal pain   Objective: Vital signs in last 24 hours: Temp:  [97.4 F (36.3 C)-98.4 F (36.9 C)] 97.7 F (36.5 C) (06/30 0800) Pulse Rate:  [90-131] 105 (06/30 0100) Resp:  [8-28] 11 (06/30 0700) BP: (119-172)/(65-111) 119/65 (06/30 0700) SpO2:  [93 %-100 %] 100 % (06/30 0700) Arterial Line BP: (122-167)/(62-88) 162/62 (06/30 0700) Weight:  [91.3 kg (201 lb 4.5 oz)] 91.3 kg (201 lb 4.5 oz) (06/30 0500) Last BM Date: 12/17/16  Intake/Output from previous day: 06/29 0701 - 06/30 0700 In: 6476 [I.V.:3150; Blood:3326] Out: 5079 [Urine:1950; Drains:129; Blood:3000] Intake/Output this shift: No intake/output data recorded.  General appearance: alert and cooperative Resp: clear to auscultation bilaterally Cardio: regular rate and rhythm GI: diffusely tender. incision looks good. drain output looks like old blood  Lab Results:   Recent Labs  12/20/16 0159 12/21/16 0453 12/21/16 1649  WBC 3.1* 3.1*  --   HGB 7.4* 7.6* 8.2*  HCT 22.0* 23.3* 24.0*  PLT 131* 164  --    BMET  Recent Labs  12/21/16 0453 12/21/16 1649 12/22/16 0400  NA 137 137 136  K 3.5 3.5 3.9  CL 100*  --  101  CO2 29  --  25  GLUCOSE 125*  --  117*  BUN 7  --  8  CREATININE 0.72  --  0.70  CALCIUM 8.7*  --  8.5*   PT/INR No results for input(s): LABPROT, INR in the last 72 hours. ABG  Recent Labs  12/21/16 1649  PHART 7.374  HCO3 22.3    Studies/Results: No results found.  Anti-infectives: Anti-infectives    Start     Dose/Rate Route Frequency Ordered Stop   12/21/16 1316  ceFAZolin (ANCEF) 2-4 GM/100ML-% IVPB    Comments:  Whitlow, Cheryl   : cabinet override      12/21/16 1316 12/21/16 1402   12/21/16 0600  ceFAZolin (ANCEF) IVPB 2g/100 mL premix     2 g 200 mL/hr over 30 Minutes Intravenous  Once 12/20/16 0837 12/21/16 1407   12/19/16 0400  cefTRIAXone (ROCEPHIN) 2 g in dextrose 5 % 50 mL IVPB  Status:   Discontinued     2 g 100 mL/hr over 30 Minutes Intravenous Every 24 hours 12/18/16 1351 12/19/16 1528   12/18/16 0415  cefTRIAXone (ROCEPHIN) 1 g in dextrose 5 % 50 mL IVPB     1 g 100 mL/hr over 30 Minutes Intravenous  Once 12/18/16 0408 12/18/16 0536      Assessment/Plan: s/p Procedure(s): LAPAROSCOPY LYSIS OF ADHESIONS, EXPLORATORY LAPAROTOMY, SPLENECTOMY (N/A) continue ng and bowel rest  Work on pain control POD 1 Continue drains  LOS: 4 days    TOTH III,Samuel Huffman S 12/22/2016

## 2016-12-23 LAB — BASIC METABOLIC PANEL
ANION GAP: 3 — AB (ref 5–15)
BUN: 6 mg/dL (ref 6–20)
CALCIUM: 8.4 mg/dL — AB (ref 8.9–10.3)
CO2: 31 mmol/L (ref 22–32)
CREATININE: 0.6 mg/dL — AB (ref 0.61–1.24)
Chloride: 99 mmol/L — ABNORMAL LOW (ref 101–111)
GFR calc Af Amer: >60 mL/min (ref >60)
GLUCOSE: 105 mg/dL — AB (ref 65–99)
Potassium: 3.5 mmol/L (ref 3.5–5.1)
Sodium: 133 mmol/L — ABNORMAL LOW (ref 135–145)

## 2016-12-23 LAB — CBC
HCT: 24 % — ABNORMAL LOW (ref 39.0–52.0)
Hemoglobin: 8.2 g/dL — ABNORMAL LOW (ref 13.0–17.0)
MCH: 31.2 pg (ref 26.0–34.0)
MCHC: 34.2 g/dL (ref 30.0–36.0)
MCV: 91.3 fL (ref 78.0–100.0)
Platelets: 290 10*3/uL (ref 150–400)
RBC: 2.63 MIL/uL — ABNORMAL LOW (ref 4.22–5.81)
RDW: 16 % — AB (ref 11.5–15.5)
WBC: 14.6 10*3/uL — ABNORMAL HIGH (ref 4.0–10.5)

## 2016-12-23 NOTE — Progress Notes (Signed)
2 Days Post-Op   Subjective/Chief Complaint: Still having pain but much better controlled today   Objective: Vital signs in last 24 hours: Temp:  [97.5 F (36.4 C)-98.5 F (36.9 C)] 98.4 F (36.9 C) (07/01 0329) Resp:  [8-27] 8 (07/01 0700) BP: (118-138)/(62-73) 122/62 (07/01 0700) SpO2:  [90 %-98 %] 98 % (07/01 0700) Arterial Line BP: (116-174)/(57-79) 133/60 (07/01 0700) Weight:  [95.3 kg (210 lb 1.6 oz)] 95.3 kg (210 lb 1.6 oz) (07/01 0500) Last BM Date: 12/17/16  Intake/Output from previous day: 06/30 0701 - 07/01 0700 In: 2290.8 [P.O.:320; I.V.:1970.8] Out: 2428 [Urine:1325; Emesis/NG output:850; Drains:253] Intake/Output this shift: No intake/output data recorded.  General appearance: alert and cooperative Resp: clear to auscultation bilaterally Cardio: regular rate and rhythm GI: soft, appropriately tender. incision ok. drain output serosanguinous  Lab Results:   Recent Labs  12/21/16 0453 12/21/16 1649 12/22/16 1135  WBC 3.1*  --  13.5*  HGB 7.6* 8.2* 8.5*  HCT 23.3* 24.0* 24.4*  PLT 164  --  234   BMET  Recent Labs  12/21/16 0453 12/21/16 1649 12/22/16 0400  NA 137 137 136  K 3.5 3.5 3.9  CL 100*  --  101  CO2 29  --  25  GLUCOSE 125*  --  117*  BUN 7  --  8  CREATININE 0.72  --  0.70  CALCIUM 8.7*  --  8.5*   PT/INR No results for input(s): LABPROT, INR in the last 72 hours. ABG  Recent Labs  12/21/16 1649  PHART 7.374  HCO3 22.3    Studies/Results: No results found.  Anti-infectives: Anti-infectives    Start     Dose/Rate Route Frequency Ordered Stop   12/21/16 1316  ceFAZolin (ANCEF) 2-4 GM/100ML-% IVPB    Comments:  Whitlow, Cheryl   : cabinet override      12/21/16 1316 12/21/16 1402   12/21/16 0600  ceFAZolin (ANCEF) IVPB 2g/100 mL premix     2 g 200 mL/hr over 30 Minutes Intravenous  Once 12/20/16 0837 12/21/16 1407   12/19/16 0400  cefTRIAXone (ROCEPHIN) 2 g in dextrose 5 % 50 mL IVPB  Status:  Discontinued     2  g 100 mL/hr over 30 Minutes Intravenous Every 24 hours 12/18/16 1351 12/19/16 1528   12/18/16 0415  cefTRIAXone (ROCEPHIN) 1 g in dextrose 5 % 50 mL IVPB     1 g 100 mL/hr over 30 Minutes Intravenous  Once 12/18/16 0408 12/18/16 0536      Assessment/Plan: s/p Procedure(s): LAPAROSCOPY LYSIS OF ADHESIONS, EXPLORATORY LAPAROTOMY, SPLENECTOMY (N/A) Continue ng and bowel rest  pca for pain control Monitor drain output Leave foley in 1 more day for strict I and o's  LOS: 5 days    TOTH III,Tram Wrenn S 12/23/2016

## 2016-12-23 NOTE — Progress Notes (Signed)
L radial a-line showing inappropriate wave form. No blood return noted. A-line removed without complications. Will monitor. S.Aiden Helzer, RN

## 2016-12-23 NOTE — Progress Notes (Signed)
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 No esophageal varices. - Type 1 isolated gastric varices (IGV1, varices located in the fundus), as the cause of bleeding without active bleeding high-risk stigmata. 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   Acute blood loss anemia (on admission) Then post op anemia:  -inr 1.05, plt 149, hgb 6.6 on admission -S/p prbc transfusionx1units on 6/27 -He received 5 units of packed red blood cells, 5 units of plasma, and 1 sixpack of platelets during the surgery on 6/29   Early cirrhosis -There is at least radiological evidence that he has early cirrhosis on CT scan done in February. -patient report has stopped drinking alcohol -Per GI. Has negative hepatitis C. -Patient denies  histroy of cirrhosis, he seems to be irritated about this diagnosis  Elevated blood sugar -He was not fasting, CBG was 194.  -hemoglobin A1c 6, but may not be accurate in the setting of acute anemia, will need repeat a1c at hospital follow up    hospitalist will sign off,  General surgery is taking over the care Please do not hesitate to call if medicine consult needed.  Code Status: full  Family Communication: patient   Disposition Plan: pending   Consultants:  GI  General surgery  Procedures:  Intubation and extubation on 6/26  egd on 6/26  splenectomy on 6/29  prbc /plt/plasma transfusion  Antibiotics:  Rocephin from admission to 6/27   Objective: BP 122/62   Pulse (!) 105   Temp 98.4 F (36.9 C) (Oral)   Resp (!) 8   Ht 6\' 2"  (1.88 m)   Wt 95.3 kg (210 lb 1.6 oz)   SpO2 98%   BMI 26.98 kg/m   Intake/Output Summary (Last 24 hours) at 12/23/16 0815 Last data filed at 12/23/16 0600  Gross per 24 hour  Intake          2290.83 ml  Output             2428 ml  Net          -137.17 ml   Filed Weights   12/21/16 0643 12/22/16 0500 12/23/16 0500  Weight: 89.9 kg (198 lb 4.8 oz) 91.3 kg (201 lb 4.5 oz) 95.3 kg (210  lb 1.6 oz)    Exam:   General:  Feeling better, NAD, on ng suction  Cardiovascular: RRR  Respiratory: CTABL  Abdomen: post op changes, dressing intact, jp drain bloody, incision pain, decreased BS  Musculoskeletal: No Edema  Neuro: aaox3  Data Reviewed: Basic Metabolic Panel:  Recent Labs Lab 12/18/16 0020 12/19/16 0236 12/20/16 0159 12/21/16 0453 12/21/16 1649 12/22/16 0400  NA 135 136 135 137 137 136  K 3.7 3.8 3.5 3.5 3.5 3.9  CL 99* 102 103 100*  --  101  CO2 26 28 25 29   --  25  GLUCOSE 194* 131* 100* 125*  --  117*  BUN 20 18 11 7   --  8  CREATININE 0.75 0.78 0.82 0.72  --  0.70  CALCIUM 9.0 8.1* 8.1* 8.7*  --  8.5*  MG  --  1.8 1.8 1.8  --   --   PHOS  --  2.5  --   --   --   --    Liver Function  Tests:  Recent Labs Lab 12/18/16 0020 12/20/16 0159  AST 22 18  ALT 13* 11*  ALKPHOS 63 47  BILITOT 0.8 0.6  PROT 7.6 5.8*  ALBUMIN 4.2 3.3*    Recent Labs Lab 12/18/16 0020 12/20/16 0159  LIPASE 71* 15   No results for input(s): AMMONIA in the last 168 hours. CBC:  Recent Labs Lab 12/18/16 0020  12/19/16 1442 12/19/16 1948 12/20/16 0159 12/21/16 0453 12/21/16 1649 12/22/16 1135  WBC 8.5  < > 3.5* 3.5* 3.1* 3.1*  --  13.5*  NEUTROABS 6.5  --   --   --   --  1.8  --   --   HGB 9.1*  < > 7.8* 7.7* 7.4* 7.6* 8.2* 8.5*  HCT 27.7*  < > 24.2* 23.2* 22.0* 23.3* 24.0* 24.4*  MCV 100.7*  < > 96.0 94.7 94.8 95.9  --  89.4  PLT 313  < > 149* 131* 131* 164  --  234  < > = values in this interval not displayed. Cardiac Enzymes:   No results for input(s): CKTOTAL, CKMB, CKMBINDEX, TROPONINI in the last 168 hours. BNP (last 3 results) No results for input(s): BNP in the last 8760 hours.  ProBNP (last 3 results) No results for input(s): PROBNP in the last 8760 hours.  CBG: No results for input(s): GLUCAP in the last 168 hours.  Recent Results (from the past 240 hour(s))  MRSA PCR Screening     Status: None   Collection Time: 12/18/16  1:52 PM  Result Value Ref Range Status   MRSA by PCR NEGATIVE NEGATIVE Final    Comment:        The GeneXpert MRSA Assay (FDA approved for NASAL specimens only), is one component of a comprehensive MRSA colonization surveillance program. It is not intended to diagnose MRSA infection nor to guide or monitor treatment for MRSA infections.   MRSA PCR Screening     Status: None   Collection Time: 12/21/16  6:20 PM  Result Value Ref Range Status   MRSA by PCR NEGATIVE NEGATIVE Final    Comment:        The GeneXpert MRSA Assay (FDA approved for NASAL specimens only), is one component of a comprehensive MRSA colonization surveillance program. It is not intended to diagnose MRSA infection nor to guide or monitor treatment for MRSA  infections.      Studies: No results found.  Scheduled Meds: . HYDROmorphone  Intravenous Q4H  . pantoprazole  40 mg Intravenous Q12H  . sodium chloride flush  3 mL Intravenous Q12H    Continuous Infusions: . sodium chloride Stopped (12/21/16 1815)  . dextrose 5 % and 0.9 % NaCl with KCl 20 mEq/L 100 mL/hr at 12/23/16 9604     Time spent:  Sahid Borba MD, PhD  [facility] Pager [phone]. If 7PM-7AM, please contact night-coverage at [email or web address], password [facility] 12/23/2016, 8:15 AM  LOS: 5 days

## 2016-12-24 ENCOUNTER — Encounter (HOSPITAL_COMMUNITY): Payer: Self-pay | Admitting: General Surgery

## 2016-12-24 LAB — BASIC METABOLIC PANEL
Anion gap: 8 (ref 5–15)
BUN: 6 mg/dL (ref 6–20)
CALCIUM: 8.4 mg/dL — AB (ref 8.9–10.3)
CHLORIDE: 100 mmol/L — AB (ref 101–111)
CO2: 26 mmol/L (ref 22–32)
CREATININE: 0.66 mg/dL (ref 0.61–1.24)
GFR calc Af Amer: >60 mL/min (ref >60)
GFR calc non Af Amer: >60 mL/min (ref >60)
Glucose, Bld: 141 mg/dL — ABNORMAL HIGH (ref 65–99)
Potassium: 3.6 mmol/L (ref 3.5–5.1)
SODIUM: 134 mmol/L — AB (ref 135–145)

## 2016-12-24 LAB — CBC
HCT: 24.7 % — ABNORMAL LOW (ref 39.0–52.0)
Hemoglobin: 8.1 g/dL — ABNORMAL LOW (ref 13.0–17.0)
MCH: 29.5 pg (ref 26.0–34.0)
MCHC: 32.8 g/dL (ref 30.0–36.0)
MCV: 89.8 fL (ref 78.0–100.0)
PLATELETS: 394 10*3/uL (ref 150–400)
RBC: 2.75 MIL/uL — ABNORMAL LOW (ref 4.22–5.81)
RDW: 15 % (ref 11.5–15.5)
WBC: 11.7 10*3/uL — AB (ref 4.0–10.5)

## 2016-12-24 MED ORDER — METHOCARBAMOL 1000 MG/10ML IJ SOLN
1000.0000 mg | Freq: Three times a day (TID) | INTRAVENOUS | Status: DC
  Administered 2016-12-24 – 2016-12-26 (×6): 1000 mg via INTRAVENOUS
  Filled 2016-12-24 (×7): qty 10

## 2016-12-24 NOTE — Plan of Care (Signed)
Problem: Pain Managment: Goal: General experience of comfort will improve Outcome: Progressing Discussed pain management and need for control so movent possible  Problem: Bowel/Gastric: Goal: Will not experience complications related to bowel motility Outcome: Progressing Discussed NG tube and drains and their purpose and criteria for removal  Problem: Bowel/Gastric: Goal: Will show no signs and symptoms of gastrointestinal bleeding Outcome: Progressing Hypoactive bowel sounds present. Encouraged movement and activity

## 2016-12-24 NOTE — Progress Notes (Signed)
[facility] Progress Note  3 Days Post-Op  Subjective: CC:  7/10 abdominal pain, located around incision. Denies flatus or BM. Has not mobilized since surgery. Pulling 1,000 cc on IS.   Objective: Vital signs in last 24 hours: Temp:  [98.4 F (36.9 C)-98.8 F (37.1 C)] 98.6 F (37 C) (07/01 2345) Pulse Rate:  [87-107] 94 (07/01 2000) Resp:  [6-31] 10 (07/02 0500) BP: (101-130)/(51-69) 111/63 (07/02 0500) SpO2:  [90 %-100 %] 97 % (07/02 0500) Arterial Line BP: (84-103)/(59-67) 84/67 (07/01 1100) Weight:  [95 kg (209 lb 7 oz)] 95 kg (209 lb 7 oz) (07/02 0417) Last BM Date: 12/17/16  Intake/Output from previous day: 07/01 0701 - 07/02 0700 In: 2310.5 [I.V.:2310.5] Out: 3015 [Urine:1800; Emesis/NG output:1100; Drains:115] Intake/Output this shift: No intake/output data recorded.  PE: General appearance: alert and cooperative Resp: clear to auscultation bilaterally Cardio: regular rate and rhythm GI: soft, appropriately tender. Incision c/d/i.    LUQ blake drains: drain #1 (medial) 80 cc/24 h serosanuinous, drain #2 (lateral)  35 cc/24h more serous     NGT 1,100 cc/24h bilious  GU: UOP 1,800 cc/24h    Lab Results:   Recent Labs  12/22/16 1135 12/23/16 1047  WBC 13.5* 14.6*  HGB 8.5* 8.2*  HCT 24.4* 24.0*  PLT 234 290   BMET  Recent Labs  12/23/16 1047 12/24/16 0542  NA 133* 134*  K 3.5 3.6  CL 99* 100*  CO2 31 26  GLUCOSE 105* 141*  BUN 6 6  CREATININE 0.60* 0.66  CALCIUM 8.4* 8.4*   PT/INR No results for input(s): LABPROT, INR in the last 72 hours. CMP     Component Value Date/Time   NA 134 (L) 12/24/2016 0542   K 3.6 12/24/2016 0542   CL 100 (L) 12/24/2016 0542   CO2 26 12/24/2016 0542   GLUCOSE 141 (H) 12/24/2016 0542   BUN 6 12/24/2016 0542   CREATININE 0.66 12/24/2016 0542   CALCIUM 8.4 (L) 12/24/2016 0542   PROT 5.8 (L) 12/20/2016 0159   ALBUMIN 3.3 (L) 12/20/2016 0159   AST 18 12/20/2016 0159   ALT 11 (L) 12/20/2016 0159    ALKPHOS 47 12/20/2016 0159   BILITOT 0.6 12/20/2016 0159   GFRNONAA >60 12/24/2016 0542   GFRAA >60 12/24/2016 0542   Lipase     Component Value Date/Time   LIPASE 15 12/20/2016 0159       Studies/Results: No results found.  Anti-infectives: Anti-infectives    Start     Dose/Rate Route Frequency Ordered Stop   12/21/16 1316  ceFAZolin (ANCEF) 2-4 GM/100ML-% IVPB    Comments:  Whitlow, Cheryl   : cabinet override      12/21/16 1316 12/21/16 1402   12/21/16 0600  ceFAZolin (ANCEF) IVPB 2g/100 mL premix     2 g 200 mL/hr over 30 Minutes Intravenous  Once 12/20/16 0837 12/21/16 1407   12/19/16 0400  cefTRIAXone (ROCEPHIN) 2 g in dextrose 5 % 50 mL IVPB  Status:  Discontinued     2 g 100 mL/hr over 30 Minutes Intravenous Every 24 hours 12/18/16 1351 12/19/16 1528   12/18/16 0415  cefTRIAXone (ROCEPHIN) 1 g in dextrose 5 % 50 mL IVPB     1 g 100 mL/hr over 30 Minutes Intravenous  Once 12/18/16 0408 12/18/16 0536      Assessment/Plan Hematemesis 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 Avel Peace - continue NGT and  await bowel function, consider TNA in 48 hours if no return of bowel function.  - continue PCA, add robaxin  - follow drain output (80 + 35 = 115 cc/24h) - will need post-splenectomy vaccinations (H.flu, PCV 13, meningococcal) prior to discharge - D/C foley, adequate urine output for over 72 hours - MOBILIZE!  - transfer to the floor today vs tomorrow   ABL anemia -  hgb 8.2/hct 24; plts 290; s/p transfusion 5 units pRBCs, 5 units FFP, and 1 sixpack on platelets on 6/29  PMH chronic pancreatitis  Early cirrhosis - diagnosed by CT scan.  Elevated blood sugar - Hgb A1c 6, repeat A1c with PCP after hospital discharge  FEN - NPO, IVF; continue  GI Proph: Protonix BID  VTE: SCD's   LOS: 6 days    Adam PhenixElizabeth S Simaan , [facility] 12/24/2016, 7:49 AM Pager: [phone] Consults: [phone] Mon-Fri 7:00 am-4:30 pm Sat-Sun 7:00 am-11:30 am

## 2016-12-24 NOTE — Discharge Instructions (Signed)
SCHEDULE AN APPOINTMENT WITH YOUR PRIMARY CARE DOCTOR/AT HEALTH CLINIC IN 8 WEEKS FOR PCV 23 (PNEUMOCOCCAL) VACCINATION.   [facility]      [facility], [place] [phone]  OPEN ABDOMINAL SURGERY: POST OP INSTRUCTIONS  Always review your discharge instruction sheet given to you by the facility where your surgery was performed.  IF YOU HAVE DISABILITY OR FAMILY LEAVE FORMS, YOU MUST BRING THEM TO THE OFFICE FOR PROCESSING.  PLEASE DO NOT GIVE THEM TO YOUR DOCTOR.  1. A prescription for pain medication may be given to you upon discharge.  Take your pain medication as prescribed, if needed.  If narcotic pain medicine is not needed, then you may take acetaminophen (Tylenol) or ibuprofen (Advil) as needed. 2. Take your usually prescribed medications unless otherwise directed. 3. If you need a refill on your pain medication, please contact your pharmacy. They will contact our office to request authorization.  Prescriptions will not be filled after 5pm or on week-ends. 4. You should follow a light diet the first few days after arrival home, such as soup and crackers, pudding, etc.unless your doctor has advised otherwise. A high-fiber, low fat diet can be resumed as tolerated.   Be sure to include lots of fluids daily. Most patients will experience some swelling and bruising on the chest and neck area.  Ice packs will help.  Swelling and bruising can take several days to resolve 5. Most patients will experience some swelling and bruising in the area of the incision. Ice pack will help. Swelling and bruising can take several days to resolve..  6. It is common to experience some constipation if taking pain medication after surgery.  Increasing fluid intake and taking a stool softener will usually help or prevent this problem from occurring.  A mild laxative (Milk of Magnesia or Miralax) should be taken according to package directions if there are no bowel movements after 48 hours. 7.  You may have  steri-strips (small skin tapes) in place directly over the incision.  These strips should be left on the skin for 7-10 days.  If your surgeon used skin glue on the incision, you may shower in 24 hours.  The glue will flake off over the next 2-3 weeks.  Any sutures or staples will be removed at the office during your follow-up visit. You may find that a light gauze bandage over your incision may keep your staples from being rubbed or pulled. You may shower and replace the bandage daily. 8. ACTIVITIES:  You may resume regular (light) daily activities beginning the next day--such as daily self-care, walking, climbing stairs--gradually increasing activities as tolerated.  You may have sexual intercourse when it is comfortable.  Refrain from any heavy lifting or straining until approved by your doctor. a. You may drive when you no longer are taking prescription pain medication, you can comfortably wear a seatbelt, and you can safely maneuver your car and apply brakes b. Return to Work: ___________________________________ 9. You should see your doctor in the office for a follow-up appointment approximately two weeks after your surgery.  Make sure that you call for this appointment within a day or two after you arrive home to insure a convenient appointment time. OTHER INSTRUCTIONS:  _____________________________________________________________ _____________________________________________________________  WHEN TO CALL YOUR DOCTOR: 1. Fever over 101.0 2. Inability to urinate 3. Nausea and/or vomiting 4. Extreme swelling or bruising 5. Continued bleeding from incision. 6. Increased pain, redness, or drainage from the incision. 7. Difficulty swallowing or breathing 8. Muscle cramping  or spasms. 9. Numbness or tingling in hands or feet or around lips.  The clinic staff is available to answer your questions during regular business hours.  Please dont hesitate to call and ask to speak to one of the nurses  if you have concerns.  For further questions, please visit [email or web address]

## 2016-12-24 NOTE — Progress Notes (Signed)
Pt reporting pain is too great to get out of bed. He stated his PCA did not work properly throughout the night. PCA examined and appears to be in correct working order. PCA administration schedule in pump as ordered. This RN monitored and patient able to deliver pain medication every 8 minutes x2. Patient believes PCA is now in proper working order. Pt educated on importance of moving and risks associated with remaining in bed. Will continue to encourage patient to get up. S.Sunjai Levandoski, RN

## 2016-12-25 LAB — COMPREHENSIVE METABOLIC PANEL
ALT: 16 U/L — ABNORMAL LOW (ref 17–63)
ANION GAP: 7 (ref 5–15)
AST: 28 U/L (ref 15–41)
Albumin: 2.9 g/dL — ABNORMAL LOW (ref 3.5–5.0)
Alkaline Phosphatase: 52 U/L (ref 38–126)
BUN: <5 mg/dL — ABNORMAL LOW (ref 6–20)
CHLORIDE: 99 mmol/L — AB (ref 101–111)
CO2: 28 mmol/L (ref 22–32)
Calcium: 8.5 mg/dL — ABNORMAL LOW (ref 8.9–10.3)
Creatinine, Ser: 0.7 mg/dL (ref 0.61–1.24)
Glucose, Bld: 108 mg/dL — ABNORMAL HIGH (ref 65–99)
POTASSIUM: 3.3 mmol/L — AB (ref 3.5–5.1)
Sodium: 134 mmol/L — ABNORMAL LOW (ref 135–145)
Total Bilirubin: 0.5 mg/dL (ref 0.3–1.2)
Total Protein: 6.2 g/dL — ABNORMAL LOW (ref 6.5–8.1)

## 2016-12-25 LAB — PREALBUMIN: PREALBUMIN: 9 mg/dL — AB (ref 18–38)

## 2016-12-25 MED ORDER — LIP MEDEX EX OINT
TOPICAL_OINTMENT | CUTANEOUS | Status: AC
  Filled 2016-12-25: qty 7

## 2016-12-25 MED ORDER — HEPARIN SODIUM (PORCINE) 5000 UNIT/ML IJ SOLN
5000.0000 [IU] | Freq: Three times a day (TID) | INTRAMUSCULAR | Status: DC
  Administered 2016-12-25 – 2016-12-29 (×12): 5000 [IU] via SUBCUTANEOUS
  Filled 2016-12-25 (×11): qty 1

## 2016-12-25 NOTE — Progress Notes (Signed)
In room with pt, he was complaining of NG tube discomfort. He moved his head and NG tube fell from nose. Pt states that he is not currently nauseated and hasn't been since his night of discharge. Will continue to monitor at this time.

## 2016-12-25 NOTE — Progress Notes (Signed)
[facility] Progress Note  4 Days Post-Op  Subjective: CC:  Abdominal pain improved with addition of robaxin. Reports getting up to chair yesterday and walking short distances in his room. Reports stomach "gurgling" but denies flatus or BM.   Afebrile, VSS Objective: Vital signs in last 24 hours: Temp:  [98 F (36.7 C)-98.9 F (37.2 C)] 98.5 F (36.9 C) (07/03 0332) Pulse Rate:  [82-100] 92 (07/02 1610) Resp:  [10-29] 14 (07/03 0600) BP: (105-130)/(48-71) 116/62 (07/03 0600) SpO2:  [80 %-100 %] 93 % (07/03 0600) FiO2 (%):  [94 %] 94 % (07/02 2019) Weight:  [94.4 kg (208 lb 1.8 oz)] 94.4 kg (208 lb 1.8 oz) (07/03 0320) Last BM Date: 01/16/17  Intake/Output from previous day: 07/02 0701 - 07/03 0700 In: 2419.9 [I.V.:2209.9; NG/GT:30; IV Piggyback:180] Out: 3010 [Urine:1840; Emesis/NG output:1050; Drains:120] Intake/Output this shift: No intake/output data recorded.  PE: General appearance: alert and cooperative Resp: clear to auscultation bilaterally Cardio: regular rate and rhythm GI: soft, appropriately tender, bowel sounds in all 4 quadrants, Honeycomb dressing removed and splenectomy incision c/d/i. Staples in place.             LUQ blake drains: drain #1 (medial) 60 cc/24 h SS, drain #2 (lateral)60 cc/24h SS                      NGT 1,050 cc/24h bilious   Lab Results:   Recent Labs  12/23/16 1047 12/24/16 1241  WBC 14.6* 11.7*  HGB 8.2* 8.1*  HCT 24.0* 24.7*  PLT 290 394   BMET  Recent Labs  12/23/16 1047 12/24/16 0542  NA 133* 134*  K 3.5 3.6  CL 99* 100*  CO2 31 26  GLUCOSE 105* 141*  BUN 6 6  CREATININE 0.60* 0.66  CALCIUM 8.4* 8.4*   PT/INR No results for input(s): LABPROT, INR in the last 72 hours. CMP     Component Value Date/Time   NA 134 (L) 12/24/2016 0542   K 3.6 12/24/2016 0542   CL 100 (L) 12/24/2016 0542   CO2 26 12/24/2016 0542   GLUCOSE 141 (H) 12/24/2016 0542   BUN 6 12/24/2016 0542   CREATININE 0.66 12/24/2016  0542   CALCIUM 8.4 (L) 12/24/2016 0542   PROT 5.8 (L) 12/20/2016 0159   ALBUMIN 3.3 (L) 12/20/2016 0159   AST 18 12/20/2016 0159   ALT 11 (L) 12/20/2016 0159   ALKPHOS 47 12/20/2016 0159   BILITOT 0.6 12/20/2016 0159   GFRNONAA >60 12/24/2016 0542   GFRAA >60 12/24/2016 0542   Lipase     Component Value Date/Time   LIPASE 15 12/20/2016 0159       Studies/Results: No results found.  Anti-infectives: Anti-infectives    Start     Dose/Rate Route Frequency Ordered Stop   12/21/16 1316  ceFAZolin (ANCEF) 2-4 GM/100ML-% IVPB    Comments:  Whitlow, Cheryl   : cabinet override      12/21/16 1316 12/21/16 1402   12/21/16 0600  ceFAZolin (ANCEF) IVPB 2g/100 mL premix     2 g 200 mL/hr over 30 Minutes Intravenous  Once 12/20/16 0837 12/21/16 1407   12/19/16 0400  cefTRIAXone (ROCEPHIN) 2 g in dextrose 5 % 50 mL IVPB  Status:  Discontinued     2 g 100 mL/hr over 30 Minutes Intravenous Every 24 hours 12/18/16 1351 12/19/16 1528   12/18/16 0415  cefTRIAXone (ROCEPHIN) 1 g in dextrose 5 % 50 mL IVPB  1 g 100 mL/hr over 30 Minutes Intravenous  Once 12/18/16 0408 12/18/16 0536     Assessment/Plan Hematemesis 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 Avel Peaceodd Rosenbower - continue NGT and await bowel function, consider TNA in 24-48 hours if no return of bowel function.  - follow drain output (120cc/24h) - will need post-splenectomy vaccinations (H.flu, PCV 13, meningococcal) prior to discharge - MOBILIZE!   - Pain dilaudid PCA, Robaxin q 8h PRN  - transfer to the floor   ABL anemia -  hgb 8.2/hct 24; plts 290; s/p transfusion 5 units pRBCs, 5 units FFP, and 1 sixpack on platelets on 6/29  PMH EtOH abuse - quit 2016 Chronic pancreatitis  Early cirrhosis - diagnosed by CT scan Elevated blood sugar - Hgb A1c 6, repeat A1c with PCP after hospital  discharge  FEN - NPO, IVF; checking CMET and pre-albumin.  GI Proph: Protonix BID  VTE: SCD's, will discuss anticoagulation with MD   LOS: 7 days    Adam PhenixElizabeth S Kayana Thoen , [facility] 12/25/2016, 7:35 AM Pager: [phone] Consults: [phone] Mon-Fri 7:00 am-4:30 pm Sat-Sun 7:00 am-11:30 am

## 2016-12-26 LAB — CBC
HCT: 21.6 % — ABNORMAL LOW (ref 39.0–52.0)
HEMOGLOBIN: 7.1 g/dL — AB (ref 13.0–17.0)
MCH: 29.7 pg (ref 26.0–34.0)
MCHC: 32.9 g/dL (ref 30.0–36.0)
MCV: 90.4 fL (ref 78.0–100.0)
Platelets: 571 10*3/uL — ABNORMAL HIGH (ref 150–400)
RBC: 2.39 MIL/uL — ABNORMAL LOW (ref 4.22–5.81)
RDW: 15.2 % (ref 11.5–15.5)
WBC: 9.6 10*3/uL (ref 4.0–10.5)

## 2016-12-26 MED ORDER — METHOCARBAMOL 1000 MG/10ML IJ SOLN
1000.0000 mg | Freq: Four times a day (QID) | INTRAVENOUS | Status: DC
  Administered 2016-12-26 – 2016-12-29 (×11): 1000 mg via INTRAVENOUS
  Filled 2016-12-26 (×14): qty 10

## 2016-12-26 NOTE — Progress Notes (Signed)
Spoke with surgery on call instructed to leave NG tube out til the morning and let day shift MD decide if pt needs NG placed or not

## 2016-12-26 NOTE — Progress Notes (Signed)
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 Avel Peace - has a postop ileus - follow drain output (120cc/24h) - will need post-splenectomy vaccinations (H.flu, PCV 13, meningococcal) prior to discharge - Pain dilaudid PCA, increase Robaxin to q 6h  ABL anemia- hgb down to 7.1; no clinical evidence of bleeding  PMH EtOH abuse - quit 2016 Chronic pancreatitis  Early cirrhosis - diagnosed by CT scan; liver did not look cirrhotic at time of surgery Elevated blood sugar- Hgb A1c 6, repeat A1c with PCP after hospital discharge  FEN - NPO, IVF; checking CMET and pre-albumin.  GI Proph: Protonix BID  VTE: SCD's, hold on pharmaceutical anticoagulation given drop in hemoglobin  Plan:  Repeat CBC in AM.  Wait for ileus to resolve.   LOS: 8 days     5 Days Post-Op  Chief Complaint/Subjective: Ng fell out last night.  No nausea.  No flatus or BM. Still using PCA a lot  Objective: Vital signs in last 24 hours: Temp:  [97.7 F (36.5 C)-99.3 F (37.4 C)] 98.4 F (36.9 C) (07/04 0500) Pulse Rate:  [80-88] 80 (07/04 0500) Resp:  [10-24] 11 (07/04 0800) BP: (96-125)/(50-69) 102/61 (07/04 0500) SpO2:  [93 %-99 %] 96 % (07/04 0800) Weight:  [95.8 kg (211 lb 3.2 oz)] 95.8 kg (211 lb 3.2 oz) (07/04 0649) Last BM Date: 01/16/17  Intake/Output from previous day: 07/03 0701 - 07/04 0700 In: 2826 [I.V.:2706; IV Piggyback:120] Out: 1760 [Urine:1650; Drains:110] Intake/Output this shift: No intake/output data recorded.  PE: General- In NAD.  Awake and alert. Abdomen-soft,serous drain output, incisions are clean and intact, hypoactive bowel sounds  Lab Results:   Recent Labs  12/24/16 1241 12/26/16 0508  WBC 11.7* 9.6  HGB 8.1* 7.1*  HCT 24.7* 21.6*  PLT 394 571*   BMET  Recent Labs  12/24/16 0542 12/25/16 1243  NA 134* 134*  K  3.6 3.3*  CL 100* 99*  CO2 26 28  GLUCOSE 141* 108*  BUN 6 <5*  CREATININE 0.66 0.70  CALCIUM 8.4* 8.5*   PT/INR No results for input(s): LABPROT, INR in the last 72 hours. Comprehensive Metabolic Panel:    Component Value Date/Time   NA 134 (L) 12/25/2016 1243   NA 134 (L) 12/24/2016 0542   K 3.3 (L) 12/25/2016 1243   K 3.6 12/24/2016 0542   CL 99 (L) 12/25/2016 1243   CL 100 (L) 12/24/2016 0542   CO2 28 12/25/2016 1243   CO2 26 12/24/2016 0542   BUN <5 (L) 12/25/2016 1243   BUN 6 12/24/2016 0542   CREATININE 0.70 12/25/2016 1243   CREATININE 0.66 12/24/2016 0542   GLUCOSE 108 (H) 12/25/2016 1243   GLUCOSE 141 (H) 12/24/2016 0542   CALCIUM 8.5 (L) 12/25/2016 1243   CALCIUM 8.4 (L) 12/24/2016 0542   AST 28 12/25/2016 1243   AST 18 12/20/2016 0159   ALT 16 (L) 12/25/2016 1243   ALT 11 (L) 12/20/2016 0159   ALKPHOS 52 12/25/2016 1243   ALKPHOS 47 12/20/2016 0159   BILITOT 0.5 12/25/2016 1243   BILITOT 0.6 12/20/2016 0159   PROT 6.2 (L) 12/25/2016 1243   PROT 5.8 (L) 12/20/2016 0159   ALBUMIN 2.9 (L) 12/25/2016 1243   ALBUMIN 3.3 (L) 12/20/2016 0159     Studies/Results: No results found.  Anti-infectives: Anti-infectives    Start     Dose/Rate Route Frequency Ordered Stop  12/21/16 1316  ceFAZolin (ANCEF) 2-4 GM/100ML-% IVPB    Comments:  Whitlow, Cheryl   : cabinet override      12/21/16 1316 12/21/16 1402   12/21/16 0600  ceFAZolin (ANCEF) IVPB 2g/100 mL premix     2 g 200 mL/hr over 30 Minutes Intravenous  Once 12/20/16 0837 12/21/16 1407   12/19/16 0400  cefTRIAXone (ROCEPHIN) 2 g in dextrose 5 % 50 mL IVPB  Status:  Discontinued     2 g 100 mL/hr over 30 Minutes Intravenous Every 24 hours 12/18/16 1351 12/19/16 1528   12/18/16 0415  cefTRIAXone (ROCEPHIN) 1 g in dextrose 5 % 50 mL IVPB     1 g 100 mL/hr over 30 Minutes Intravenous  Once 12/18/16 0408 12/18/16 0536       Vanesa Renier J 12/26/2016

## 2016-12-27 LAB — CBC
HEMATOCRIT: 21.5 % — AB (ref 39.0–52.0)
HEMOGLOBIN: 7.2 g/dL — AB (ref 13.0–17.0)
MCH: 30.1 pg (ref 26.0–34.0)
MCHC: 33.5 g/dL (ref 30.0–36.0)
MCV: 90 fL (ref 78.0–100.0)
Platelets: 715 10*3/uL — ABNORMAL HIGH (ref 150–400)
RBC: 2.39 MIL/uL — AB (ref 4.22–5.81)
RDW: 15.3 % (ref 11.5–15.5)
WBC: 11.9 10*3/uL — AB (ref 4.0–10.5)

## 2016-12-27 NOTE — Progress Notes (Signed)
6 Days Post-Op   Subjective/Chief Complaint: No complaints. Ng fell out last night. No nausea   Objective: Vital signs in last 24 hours: Temp:  [97.9 F (36.6 C)-98.9 F (37.2 C)] 98.4 F (36.9 C) (07/05 0550) Pulse Rate:  [90-95] 92 (07/05 0550) Resp:  [9-17] 13 (07/05 0625) BP: (107-122)/(60-67) 109/60 (07/05 0550) SpO2:  [93 %-100 %] 98 % (07/05 0625) Weight:  [89.4 kg (197 lb 1.5 oz)] 89.4 kg (197 lb 1.5 oz) (07/05 0500) Last BM Date: 01/16/17  Intake/Output from previous day: 07/04 0701 - 07/05 0700 In: 1431 [I.V.:1311; IV Piggyback:120] Out: 505 [Urine:425; Drains:80] Intake/Output this shift: No intake/output data recorded.  General appearance: alert and cooperative Resp: clear to auscultation bilaterally Cardio: regular rate and rhythm GI: soft, minimal tenderness. drain output serous  Lab Results:   Recent Labs  12/26/16 0508 12/27/16 0001  WBC 9.6 11.9*  HGB 7.1* 7.2*  HCT 21.6* 21.5*  PLT 571* 715*   BMET  Recent Labs  12/25/16 1243  NA 134*  K 3.3*  CL 99*  CO2 28  GLUCOSE 108*  BUN <5*  CREATININE 0.70  CALCIUM 8.5*   PT/INR No results for input(s): LABPROT, INR in the last 72 hours. ABG No results for input(s): PHART, HCO3 in the last 72 hours.  Invalid input(s): PCO2, PO2  Studies/Results: No results found.  Anti-infectives: Anti-infectives    Start     Dose/Rate Route Frequency Ordered Stop   12/21/16 1316  ceFAZolin (ANCEF) 2-4 GM/100ML-% IVPB    Comments:  Whitlow, Cheryl   : cabinet override      12/21/16 1316 12/21/16 1402   12/21/16 0600  ceFAZolin (ANCEF) IVPB 2g/100 mL premix     2 g 200 mL/hr over 30 Minutes Intravenous  Once 12/20/16 0837 12/21/16 1407   12/19/16 0400  cefTRIAXone (ROCEPHIN) 2 g in dextrose 5 % 50 mL IVPB  Status:  Discontinued     2 g 100 mL/hr over 30 Minutes Intravenous Every 24 hours 12/18/16 1351 12/19/16 1528   12/18/16 0415  cefTRIAXone (ROCEPHIN) 1 g in dextrose 5 % 50 mL IVPB     1 g 100  mL/hr over 30 Minutes Intravenous  Once 12/18/16 0408 12/18/16 0536      Assessment/Plan: s/p Procedure(s): LAPAROSCOPY LYSIS OF ADHESIONS, EXPLORATORY LAPAROTOMY, SPLENECTOMY (N/A) Advance diet. Start clears today Monitor drain appearance as diet advances ambulate  LOS: 9 days    TOTH III,PAUL S 12/27/2016

## 2016-12-27 NOTE — Progress Notes (Signed)
Initial Nutrition Assessment  INTERVENTION:   Encourage PO intake Diet advancement per MD RD will monitor for needs  NUTRITION DIAGNOSIS:   Inadequate oral intake related to vomiting (post-op ileus) as evidenced by  (clear liquid diet).  GOAL:   Patient will meet greater than or equal to 90% of their needs  MONITOR:   PO intake, Labs, Weight trends, I & O's  REASON FOR ASSESSMENT:   Malnutrition Screening Tool    ASSESSMENT:   51 y.o. male with medical history significant of history of upper GI bleed, esophageal varices and questionable cirrhosis. He also had history of acute pancreatitis with pseudocyst. Reported his stools started to be dark for the past couple of days, he vomited 3-4 times since yesterday afternoon, maroon blood, he presented initially to [facility] and then [facility] for further evaluation. His vitals stable, hemoglobin dropped from 11.9 to 9.1. He'll be admitted to stepdown. 6/29: s/p LAPAROSCOPY LYSIS OF ADHESIONS, EXPLORATORY LAPAROTOMY, SPLENECTOMY (N/A)  Patient in room but in the bathroom at time of visit and requested RD return at a later time.  Per chart review pt with h/o ETOH abuse. Prior to surgery, pt was consuming 100% of meals. Developed post-op ileus, had NGT placed which is now out.   Per chart review, weight is stable. Did not perform NFPE but will attempt at follow-up.  Medications: IV Protonix BID Labs reviewed: Low Na, K  Diet Order:  Diet clear liquid Room service appropriate? Yes; Fluid consistency: Thin  Skin:  Reviewed, no issues  Last BM:   unknown  Height:   Ht Readings from Last 1 Encounters:  12/18/16 6\' 2"  (1.88 m)    Weight:   Wt Readings from Last 1 Encounters:  12/27/16 197 lb 1.5 oz (89.4 kg)    Ideal Body Weight:  86.4 kg  BMI:  Body mass index is 25.31 kg/m.  Estimated Nutritional Needs:   Kcal:  2200-2400  Protein:  100-110g  Fluid:  2.2L/day  EDUCATION NEEDS:    No education needs identified at this time  Tilda FrancoLindsey Lysbeth Dicola, MS, RD, LDN Pager: [phone] After Hours Pager: [phone]

## 2016-12-28 LAB — CBC
HEMATOCRIT: 21.5 % — AB (ref 39.0–52.0)
Hemoglobin: 7 g/dL — ABNORMAL LOW (ref 13.0–17.0)
MCH: 28.6 pg (ref 26.0–34.0)
MCHC: 32.6 g/dL (ref 30.0–36.0)
MCV: 87.8 fL (ref 78.0–100.0)
PLATELETS: 894 10*3/uL — AB (ref 150–400)
RBC: 2.45 MIL/uL — AB (ref 4.22–5.81)
RDW: 15.6 % — ABNORMAL HIGH (ref 11.5–15.5)
WBC: 8.5 10*3/uL (ref 4.0–10.5)

## 2016-12-28 MED ORDER — ACETAMINOPHEN 325 MG PO TABS
650.0000 mg | ORAL_TABLET | Freq: Four times a day (QID) | ORAL | Status: DC | PRN
  Administered 2016-12-30: 650 mg via ORAL
  Filled 2016-12-28: qty 2

## 2016-12-28 MED ORDER — HYDROMORPHONE HCL-NACL 0.5-0.9 MG/ML-% IV SOSY
0.5000 mg | PREFILLED_SYRINGE | INTRAVENOUS | Status: DC | PRN
  Administered 2016-12-28: 1 mg via INTRAVENOUS
  Administered 2016-12-28 (×2): 0.5 mg via INTRAVENOUS
  Administered 2016-12-28 – 2016-12-29 (×7): 1 mg via INTRAVENOUS
  Filled 2016-12-28: qty 1
  Filled 2016-12-28 (×5): qty 2
  Filled 2016-12-28: qty 1
  Filled 2016-12-28 (×3): qty 2

## 2016-12-28 MED ORDER — ENSURE ENLIVE PO LIQD
237.0000 mL | Freq: Two times a day (BID) | ORAL | Status: DC
  Administered 2016-12-29: 237 mL via ORAL

## 2016-12-28 MED ORDER — OXYCODONE HCL 5 MG PO TABS
5.0000 mg | ORAL_TABLET | ORAL | Status: DC | PRN
  Administered 2016-12-28 (×2): 10 mg via ORAL
  Administered 2016-12-28 (×2): 5 mg via ORAL
  Administered 2016-12-28 – 2016-12-30 (×8): 10 mg via ORAL
  Filled 2016-12-28 (×8): qty 2
  Filled 2016-12-28: qty 1
  Filled 2016-12-28 (×3): qty 2
  Filled 2016-12-28: qty 1

## 2016-12-28 NOTE — Progress Notes (Signed)
Patient ID: Samuel Huffman, male   DOB: 07/02/1965, 51 y.o.   MRN: [id number]  [facility] Progress Note  7 Days Post-Op  Subjective: CC- tired Main complaint this morning is being unable to sleep due to beeping from PCA. States that his abdominal pain is slowly improving. He is tolerating clear liquids. Denies n/v. Passing flatus, no BM.  Objective: Vital signs in last 24 hours: Temp:  [98.2 F (36.8 C)-98.3 F (36.8 C)] 98.3 F (36.8 C) (07/06 0458) Pulse Rate:  [78-82] 78 (07/06 0458) Resp:  [10-16] 10 (07/06 0458) SpO2:  [87 %-100 %] 96 % (07/06 0458) Weight:  [196 lb (88.9 kg)] 196 lb (88.9 kg) (07/06 0458) Last BM Date: 01/16/17  Intake/Output from previous day: 07/05 0701 - 07/06 0700 In: -  Out: 50 [Drains:50] Intake/Output this shift: No intake/output data recorded.  PE: Gen:  Alert, NAD, pleasant HEENT: EOM's intact, pupils equal  Card:  RRR, no M/G/R heard Pulm:  CTAB, no W/R/R, effort normal Abd: Soft, ND, appropriately tender, +BS, incision C/D/I with staples intact, drains x2 with minimal serosanguinous drainage Ext:  No erythema, edema, or tenderness BUE/BLE  Psych: A&Ox3  Skin: no rashes noted, warm and dry  Lab Results:   Recent Labs  12/26/16 0508 12/27/16 0001  WBC 9.6 11.9*  HGB 7.1* 7.2*  HCT 21.6* 21.5*  PLT 571* 715*   BMET  Recent Labs  12/25/16 1243  NA 134*  K 3.3*  CL 99*  CO2 28  GLUCOSE 108*  BUN <5*  CREATININE 0.70  CALCIUM 8.5*   PT/INR No results for input(s): LABPROT, INR in the last 72 hours. CMP     Component Value Date/Time   NA 134 (L) 12/25/2016 1243   K 3.3 (L) 12/25/2016 1243   CL 99 (L) 12/25/2016 1243   CO2 28 12/25/2016 1243   GLUCOSE 108 (H) 12/25/2016 1243   BUN <5 (L) 12/25/2016 1243   CREATININE 0.70 12/25/2016 1243   CALCIUM 8.5 (L) 12/25/2016 1243   PROT 6.2 (L) 12/25/2016 1243   ALBUMIN 2.9 (L) 12/25/2016 1243   AST 28 12/25/2016 1243   ALT 16 (L) 12/25/2016 1243   ALKPHOS 52  12/25/2016 1243   BILITOT 0.5 12/25/2016 1243   GFRNONAA >60 12/25/2016 1243   GFRAA >60 12/25/2016 1243   Lipase     Component Value Date/Time   LIPASE 15 12/20/2016 0159       Studies/Results: No results found.  Anti-infectives: Anti-infectives    Start     Dose/Rate Route Frequency Ordered Stop   12/21/16 1316  ceFAZolin (ANCEF) 2-4 GM/100ML-% IVPB    Comments:  Whitlow, Cheryl   : cabinet override      12/21/16 1316 12/21/16 1402   12/21/16 0600  ceFAZolin (ANCEF) IVPB 2g/100 mL premix     2 g 200 mL/hr over 30 Minutes Intravenous  Once 12/20/16 0837 12/21/16 1407   12/19/16 0400  cefTRIAXone (ROCEPHIN) 2 g in dextrose 5 % 50 mL IVPB  Status:  Discontinued     2 g 100 mL/hr over 30 Minutes Intravenous Every 24 hours 12/18/16 1351 12/19/16 1528   12/18/16 0415  cefTRIAXone (ROCEPHIN) 1 g in dextrose 5 % 50 mL IVPB     1 g 100 mL/hr over 30 Minutes Intravenous  Once 12/18/16 0408 12/18/16 0536       Assessment/Plan Hematemesis 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 Avel Peaceodd Rosenbower - POD 7 - WBC WNL, afebrile - drain output 20 and 30cc/24hr serosanguinous - will need post-splenectomy vaccinations (H.flu, PCV 13, meningococcal) prior to discharge - d/c PCA and add oral pain medications. Advance to full liquids. Will wait on transfusion as patient is not symptomatic. Recheck CBC in AM.  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

## 2016-12-29 LAB — CBC
HEMATOCRIT: 20 % — AB (ref 39.0–52.0)
HEMOGLOBIN: 6.6 g/dL — AB (ref 13.0–17.0)
MCH: 29.1 pg (ref 26.0–34.0)
MCHC: 33 g/dL (ref 30.0–36.0)
MCV: 88.1 fL (ref 78.0–100.0)
Platelets: 961 10*3/uL (ref 150–400)
RBC: 2.27 MIL/uL — AB (ref 4.22–5.81)
RDW: 16.3 % — ABNORMAL HIGH (ref 11.5–15.5)
WBC: 7.4 10*3/uL (ref 4.0–10.5)

## 2016-12-29 MED ORDER — PANTOPRAZOLE SODIUM 40 MG PO TBEC: 40.0000 mg | DELAYED_RELEASE_TABLET | Freq: Two times a day (BID) | ORAL | Status: DC

## 2016-12-29 MED ORDER — BACITRACIN ZINC 500 UNIT/GM EX OINT
TOPICAL_OINTMENT | Freq: Three times a day (TID) | CUTANEOUS | Status: DC
  Administered 2016-12-29 – 2016-12-30 (×3): via TOPICAL
  Filled 2016-12-29: qty 28.35

## 2016-12-29 MED ORDER — PANTOPRAZOLE SODIUM 40 MG PO TBEC
40.0000 mg | DELAYED_RELEASE_TABLET | Freq: Every day | ORAL | Status: DC
  Administered 2016-12-29 – 2016-12-30 (×2): 40 mg via ORAL
  Filled 2016-12-29 (×2): qty 1

## 2016-12-29 NOTE — Progress Notes (Signed)
  General Surgery [facility]  Assessment & Plan: POD#8 status post open splenectomy  Hgb 6.6 this AM, VSS, not symptomatic  Repeat CBC in AM 7/8  Tolerating full liquid diet - advance to regular  PO meds, stop IV meds  Heplock IV  OOB, ambulate in halls        Samuel Huffman M. Samuel Vanalstine, MD, [facility]       [facility]       Office: [phone]    Chief Complaint: abd pain, fatigue  Subjective: Patient resting in bed.  Tolerating full liquid diet.  Passing flatus, no BM yet.  Objective: Vital signs in last 24 hours: Temp:  [98.4 F (36.9 C)-98.6 F (37 C)] 98.4 F (36.9 C) (07/07 0449) Pulse Rate:  [73-91] 73 (07/07 0449) Resp:  [14-16] 16 (07/07 0449) BP: (114-133)/(47-60) 122/47 (07/07 0449) SpO2:  [94 %-99 %] 94 % (07/07 0449) Weight:  [88.2 kg (194 lb 7.1 oz)] 88.2 kg (194 lb 7.1 oz) (07/07 0449) Last BM Date: 12/17/16  Intake/Output from previous day: 07/06 0701 - 07/07 0700 In: 960 [P.O.:310; I.V.:550; IV Piggyback:100] Out: 50 [Drains:50] Intake/Output this shift: No intake/output data recorded.  Physical Exam: HEENT - sclerae clear, mucous membranes moist Neck - soft Chest - clear bilaterally Cor - RRR Abdomen - soft without distension; BS present; wound dry and intact; JP drains with serous output Ext - no edema, non-tender Neuro - alert & oriented, no focal deficits  Lab Results:   Recent Labs  12/28/16 0855 12/29/16 0525  WBC 8.5 7.4  HGB 7.0* 6.6*  HCT 21.5* 20.0*  PLT 894* 961*   BMET No results for input(s): NA, K, CL, CO2, GLUCOSE, BUN, CREATININE, CALCIUM in the last 72 hours. PT/INR No results for input(s): LABPROT, INR in the last 72 hours. Comprehensive Metabolic Panel:    Component Value Date/Time   NA 134 (L) 12/25/2016 1243   NA 134 (L) 12/24/2016 0542   K 3.3 (L) 12/25/2016 1243   K 3.6 12/24/2016 0542   CL 99 (L) 12/25/2016 1243   CL 100 (L) 12/24/2016 0542   CO2 28 12/25/2016 1243   CO2 26  12/24/2016 0542   BUN <5 (L) 12/25/2016 1243   BUN 6 12/24/2016 0542   CREATININE 0.70 12/25/2016 1243   CREATININE 0.66 12/24/2016 0542   GLUCOSE 108 (H) 12/25/2016 1243   GLUCOSE 141 (H) 12/24/2016 0542   CALCIUM 8.5 (L) 12/25/2016 1243   CALCIUM 8.4 (L) 12/24/2016 0542   AST 28 12/25/2016 1243   AST 18 12/20/2016 0159   ALT 16 (L) 12/25/2016 1243   ALT 11 (L) 12/20/2016 0159   ALKPHOS 52 12/25/2016 1243   ALKPHOS 47 12/20/2016 0159   BILITOT 0.5 12/25/2016 1243   BILITOT 0.6 12/20/2016 0159   PROT 6.2 (L) 12/25/2016 1243   PROT 5.8 (L) 12/20/2016 0159   ALBUMIN 2.9 (L) 12/25/2016 1243   ALBUMIN 3.3 (L) 12/20/2016 0159    Studies/Results: No results found.    Laszlo Ellerby M 12/29/2016  Patient ID: Samuel Huffman, male   DOB: 1965/07/08, 51 y.o.   MRN: [id number]

## 2016-12-29 NOTE — Progress Notes (Signed)
12/29/16 Nursing 0650 Dr Magnus IvanBlackman called reg hemoglobin 6.6 and platelet count 961. No orders recieved

## 2016-12-30 DIAGNOSIS — D649 Anemia, unspecified: Secondary | ICD-10-CM | POA: Diagnosis not present

## 2016-12-30 DIAGNOSIS — D473 Essential (hemorrhagic) thrombocythemia: Secondary | ICD-10-CM | POA: Diagnosis not present

## 2016-12-30 LAB — CBC
HCT: 20.4 % — ABNORMAL LOW (ref 39.0–52.0)
HEMOGLOBIN: 6.9 g/dL — AB (ref 13.0–17.0)
MCH: 29.7 pg (ref 26.0–34.0)
MCHC: 33.8 g/dL (ref 30.0–36.0)
MCV: 87.9 fL (ref 78.0–100.0)
PLATELETS: 1069 10*3/uL — AB (ref 150–400)
RBC: 2.32 MIL/uL — AB (ref 4.22–5.81)
RDW: 16.3 % — ABNORMAL HIGH (ref 11.5–15.5)
WBC: 7 10*3/uL (ref 4.0–10.5)

## 2016-12-30 MED ORDER — PNEUMOCOCCAL VAC POLYVALENT 25 MCG/0.5ML IJ INJ
0.5000 mL | INJECTION | Freq: Once | INTRAMUSCULAR | Status: AC
  Administered 2016-12-30: 0.5 mL via INTRAMUSCULAR
  Filled 2016-12-30: qty 0.5

## 2016-12-30 MED ORDER — HAEMOPHILUS B POLYSAC CONJ VAC IM SOLR
0.5000 mL | Freq: Once | INTRAMUSCULAR | Status: AC
  Administered 2016-12-30: 0.5 mL via INTRAMUSCULAR
  Filled 2016-12-30 (×2): qty 0.5

## 2016-12-30 MED ORDER — MENINGOCOCCAL A C Y&W-135 OLIG IM SOLR
0.5000 mL | Freq: Once | INTRAMUSCULAR | Status: AC
  Administered 2016-12-30: 0.5 mL via INTRAMUSCULAR
  Filled 2016-12-30: qty 0.5

## 2016-12-30 MED ORDER — OXYCODONE HCL 5 MG PO TABS: 5.0000 mg | ORAL_TABLET | ORAL | 0 refills | Status: DC | PRN

## 2016-12-30 NOTE — Care Management Note (Signed)
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     Expected Discharge Date:  12/30/16               Expected Discharge Plan:  Home/Self Care  In-House Referral:  NA  Discharge planning Services  CM Consult  Post Acute Care Choice:  NA Choice offered to:  NA  DME Arranged:  N/A DME Agency:  NA  HH Arranged:  NA HH Agency:  NA  Status of Service:  Completed, signed off  If discussed at Long Length of Stay Meetings, dates discussed:    Additional Comments:  Elliot CousinShavis, [place]ardonnay Holzmann Ellen, RN 12/30/2016, 11:52 AM

## 2016-12-30 NOTE — Discharge Summary (Signed)
Physician Discharge Summary [facility]  Patient ID: Samuel Huffman MRN: [id number] DOB/AGE: Jan 08, 1966 51 y.o.  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  Post op course uncomplicated.  Hgb fell to low of 6.6 and then stablilized.  Patient not symptomatic.  Ambulating.  Tolerating regular diet.  Will give vaccinations prior to discharge today.  Follow up later this week for wound check and staple removal.  Consults: GI  Treatments: surgery: open splenectomy  Discharge Exam: Blood pressure (!) 100/53, pulse 72, temperature 98.9 F (37.2 C), temperature source Oral, resp. rate 17, height 6\' 2"  (1.88 m), weight 89.1 kg (196 lb 6.9 oz), SpO2 99 %. HEENT - clear Neck - soft Chest - clear bilaterally Cor - RRR Abd - LUQ wound dry and intact with staples; drains with minimal serous output; port sites dry and intact  Disposition: Home  Discharge Instructions    Diet - low sodium heart healthy    Complete by:  As directed    Discharge instructions    Complete by:  As directed    [facility]  OPEN ABDOMINAL SURGERY: POST OP INSTRUCTIONS  Always review your discharge instruction sheet given to you by the facility where your surgery was performed.  A prescription for pain medication may be given to you upon discharge.  Take your pain medication as prescribed.  If narcotic pain medicine is not needed, then you may take acetaminophen (Tylenol) or ibuprofen (Advil) as needed. Take your usually  prescribed medications unless otherwise directed. If you need a refill on your pain medication, please contact your pharmacy. They will contact our office to request authorization.  Prescriptions will not be filled after 5 pm or on weekends. You should follow a light diet the first few days after arrival home, such as soup and crackers, unless your doctor has advised otherwise. A high-fiber, low fat diet can be resumed as tolerated.  Be sure to include plenty of fluids daily.  Most patients will experience some swelling and bruising in the area of the incision. Ice packs will help. Swelling and bruising can take several days to resolve. It is common to experience some constipation if taking pain medication after surgery.  Increasing fluid intake and taking a stool softener will usually help or prevent this problem from occurring.  A mild laxative (Milk of Magnesia or Miralax) should be taken according to package directions if there are no bowel movements after 48 hours.  You may have steri-strips (small skin tapes) in place directly over the incision.  These strips should be left on the skin for 5-7 days.  Any sutures or staples will be removed at the office during your follow-up visit. You may find that a light gauze bandage over your incision may keep your staples from being rubbed or pulled. You may shower and replace the bandage daily. ACTIVITIES:  You may resume regular (light) daily activities beginning the next day - such as daily self-care, walking, climbing stairs - gradually increasing activities as tolerated.  You may have sexual intercourse  when it is comfortable.  Refrain from any heavy lifting or straining until approved by your doctor.  You may drive when you no longer are taking prescription pain medication, you can comfortably wear a seatbelt, and you can safely maneuver your car and apply brakes. You should see your doctor in the office for a follow-up appointment approximately 2-3 weeks  after your surgery.  Make sure that you call for this appointment within a day or two after you arrive home to insure a convenient appointment time.  WHEN TO CALL YOUR DOCTOR: Fever greater than 101.0 Inability to urinate Persistent nausea and/or vomiting Extreme swelling or bruising Continued bleeding from incision Increased pain, redness, or drainage from the incision Difficulty swallowing or breathing Muscle cramping or spasms Numbness or tingling in hands or around lips  IF YOU HAVE DISABILITY OR FAMILY LEAVE FORMS, YOU MUST BRING THEM TO THE OFFICE FOR PROCESSING.  PLEASE DO NOT GIVE THEM TO YOUR DOCTOR.  The clinic staff is available to answer your questions during regular business hours.  Please don't hesitate to call and ask to speak to one of the nurses if you have concerns.  [facility], [place] Office: [phone]  For further questions, please visit [email or web address]   Increase activity slowly    Complete by:  As directed    Remove dressing in 24 hours    Complete by:  As directed    May begin showers on 7/9.     Allergies as of 12/30/2016   No Known Allergies     Medication List    TAKE these medications   oxyCODONE 5 MG immediate release tablet Commonly known as:  Oxy IR/ROXICODONE Take 1-2 tablets (5-10 mg total) by mouth every 4 (four) hours as needed for moderate pain or severe pain.   pantoprazole 40 MG tablet Commonly known as:  PROTONIX Take 1 tablet (40 mg total) by mouth daily at 6 (six) AM.   promethazine 25 MG tablet Commonly known as:  PHENERGAN Take 1 tablet (25 mg total) by mouth every 6 (six) hours as needed for nausea or vomiting.      Follow-up Information    Avel Peaceosenbower, Filicia Scogin, MD Follow up.   Specialty:  General Surgery Contact information: [address] [place] [address] [phone]           Velora Hecklerodd M. Django Nguyen, MD, [facility] Office:  [phone]   Signed: Velora HecklerGERKIN,Kristianna Saperstein M 12/30/2016, 8:54 AM

## 2016-12-30 NOTE — Progress Notes (Signed)
Pt was discharged home today. Instructions were reviewed with patient, prescriptions was given to the patient and questions were answered. Pt was taken to main entrance via wheelchair by NT.

## 2017-01-02 LAB — PATHOLOGIST SMEAR REVIEW

## 2017-01-18 ENCOUNTER — Telehealth: Payer: Self-pay | Admitting: General Practice

## 2017-01-18 NOTE — Telephone Encounter (Signed)
Relation to ZO:XWRUpt:self Call back number:[phone]   Reason for call:  Samuel Huffman, Todd, MD recommended patient to establish with a PCP as soon as possible due to being seen in the ED. Patient would like to establish with a male medical doctor located in the [place]. Dr. Patsy Lageropland next available would not be until October please advise if patient can be seen for acute prior to new patient appointment.

## 2017-01-18 NOTE — Telephone Encounter (Signed)
That is fine- can work him in as a new pt

## 2017-01-21 NOTE — Telephone Encounter (Signed)
Patient scheduled with PCP for 01/23/2017.  °

## 2017-01-21 NOTE — Progress Notes (Signed)
[facility] [address], [address] [place], [place] [phone] [phone]  Date:  01/23/2017   Name:  Samuel Huffman   DOB:  [birth year]   MRN:  [id number]  PCP:  Pearline Cablesopland, Iria Jamerson C, MD    Chief Complaint: Establish Care (Pt here to est care. Pt states that he recently had a splenectomy. )   History of Present Illness:  Samuel Huffman is a 51 y.o. very pleasant male patient who presents with the following:  Here today as a new patient to my clinic.  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  Post op course uncomplicated.  Hgb fell to low of 6.6 and then stablilized.  Patient not symptomatic.  Ambulating.  Tolerating regular diet.  Will give vaccinations prior to discharge today.  Follow up later this week for wound check and staple removal.  Consults: GI  Per most recent out-pt GI note from April of this year:  Review of pertinent gastrointestinal problems: 1. 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  [facility] shows fluid collection within [place] of stomach 6cm that is almost certainly psuedocyst.  Admit [facility] 2018 the gastric Dorfman fluid collection was 2.5cm.    +gastric varices (CT and EGD), doubtful underlying cirrhosis (normal Plts, INR, LFTs as of 2018 labs)  EGD 07/2016 Dr. Christella HartiganJacobs: 2.5cm intrawall cyst (see above), also  gastric varices and mild portal gastropathy changes.  CT 07/2016: pseudocyst panc tail, gastric Bonilla, also abrupt narrowing sigmoid (spasm?)  He is not yet back at work- he will be allowed to get back to work later on this month per his report He is a truck Public librariandriver-long haul and local.  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  He feels like his energy level is getting back to normal, he is getting his strength back and is eager to return to work  He is done following-up with Dr. Abbey Chattersosenbower.  Reports that he is cleared to RTW in 2 weeks per Dr. Elvera Lennox  BP Readings from Last 3 Encounters:  01/23/17 130/84  12/30/16 (!) 100/53  12/17/16 (!) 141/100   Wt Readings from Last 3 Encounters:  01/23/17 206 lb (93.4 kg)  12/30/16 196 lb 6.9 oz (89.1 kg)  12/13/16 199 lb (90.3 kg)   Staples are out, his belly pain is resolved.  He is walking for exercise but is not yet allowed to lift anything Bowels are normal  Pulse Readings from Last 3 Encounters:  01/23/17 (!) 102  12/30/16 72  12/17/16 100   Pt reports his pulse is generally a bit high    Patient Active Problem List   Diagnosis Date Noted  . S/P laparoscopy 12/21/2016  . Bleeding gastric varices   . Hyponatremia 08/04/2016  . Hypokalemia 08/04/2016  . Abnormal CT scan, stomach   . Upper GI bleed 08/02/2016  . Hematemesis 08/02/2016  .  Pancreatic pseudocyst   . Pulmonary nodule   . Acute recurrent pancreatitis 10/01/2014  . AKI (acute kidney injury) (HCC) 10/01/2014  . History of alcohol abuse 10/01/2014  . Lactic acidosis 10/01/2014  . High anion gap metabolic acidosis 10/01/2014  . Insomnia 07/05/2014    Past Medical History:  Diagnosis Date  . Acute upper GI bleeding 08/02/2016  . Anxiety   . Blood transfusion without reported diagnosis   . Cirrhosis of liver with ascites (HCC)   . Hiatal hernia   . History of alcohol abuse   . Hypertension   . Insomnia   . Pancreatic pseudocyst   . Pancreatitis   . Portal  hypertensive gastropathy (HCC)   . PTSD (post-traumatic stress disorder)   . Pulmonary nodule   . Varices, gastric     Past Surgical History:  Procedure Laterality Date  . ESOPHAGOGASTRODUODENOSCOPY N/A 12/18/2016   Procedure: ESOPHAGOGASTRODUODENOSCOPY (EGD);  Surgeon: Hilarie FredricksonPerry, John N, MD;  [facility];  Service: Endoscopy;  Laterality: N/A;  . ESOPHAGOGASTRODUODENOSCOPY (EGD) WITH PROPOFOL N/A 08/03/2016   Procedure: ESOPHAGOGASTRODUODENOSCOPY (EGD) WITH PROPOFOL;  Surgeon: Rachael Feeaniel P Jacobs, MD;  [facility];  Service: Endoscopy;  Laterality: N/A;  . EUS  08/03/2016   Procedure: UPPER ENDOSCOPIC ULTRASOUND (EUS) LINEAR;  Surgeon: Rachael Feeaniel P Jacobs, MD;  [facility];  Service: Endoscopy;;  . FEMUR SURGERY Left   . LAPAROSCOPY N/A 12/21/2016   Procedure: LAPAROSCOPY LYSIS OF ADHESIONS, EXPLORATORY LAPAROTOMY, SPLENECTOMY;  Surgeon: Avel Peaceosenbower, Todd, MD;  Location: [facility];  Service: General;  Laterality: N/A;    Social History  Substance Use Topics  . Smoking status: Current Every Day Smoker    Packs/day: 0.50    Types: Cigarettes  . Smokeless tobacco: Current User    Types: Snuff  . Alcohol use No    Family History  Problem Relation Age of Onset  . Adopted: Yes  . Healthy Son        x1    No Known Allergies  Medication list has been reviewed and updated.  Current Outpatient Prescriptions on File Prior to Visit  Medication Sig Dispense Refill  . pantoprazole (PROTONIX) 40 MG tablet Take 1 tablet (40 mg total) by mouth daily at 6 (six) AM. 30 tablet 11   No current facility-administered medications on file prior to visit.     Review of Systems:  As per HPI- otherwise negative. No fever or chills No chest pain or SOB Weakness is getting better, he feels ready to RTW soon   Physical Examination: Vitals:   01/23/17 0823  BP: 130/84  Pulse: (!) 102  Temp: 98.3 F (36.8 C)   Vitals:   01/23/17 0823  Weight: 206 lb (93.4 kg)  Height: 6'  2" (1.88 m)   Body mass index is 26.45 kg/m. Ideal Body Weight: Weight in (lb) to have BMI = 25: 194.3  GEN: WDWN, NAD, Non-toxic, A & O x 3, tall, looks well HEENT: Atraumatic, Normocephalic. Neck supple. No masses, No LAD.  Bilateral TM wnl, oropharynx normal.  PEERL,EOMI.   Conjunctiva are still pale- est hg 9 Ears and Nose: No external deformity. CV: RRR, No M/G/R. No JVD. No thrill. No extra heart sounds. PULM: CTA B, no wheezes, crackles, rhonchi. No retractions. No resp. distress. No accessory muscle use. ABD: S, NT, ND, +BS. No rebound. No HSM. EXTR: No c/c/e NEURO Normal gait.  PSYCH: Normally interactive. Conversant. Not depressed or anxious appearing.  Calm demeanor.  Well healed open splenectomy scar on  his left abdomen.     Assessment and Plan: Hospital discharge follow-up - Plan: Comprehensive metabolic panel  Acute blood loss anemia - Plan: CBC  H/O splenectomy - Plan: Comprehensive metabolic panel  History of hypertension  Here today to establish care Repeat CBC and CMP today He is recovering well following recent splenectomy He has HTN listed on his history, but reports he has not taken BP meds in many years and his BP has been ok without BP looks ok today, will follow as he continues to regain his blood counts and strength  Will plan further follow- up pending labs.    Signed Abbe Amsterdam, MD

## 2017-01-21 NOTE — Telephone Encounter (Signed)
lvm advising patient of message below °

## 2017-01-23 ENCOUNTER — Ambulatory Visit (INDEPENDENT_AMBULATORY_CARE_PROVIDER_SITE_OTHER): Payer: BLUE CROSS/BLUE SHIELD | Admitting: Family Medicine

## 2017-01-23 ENCOUNTER — Encounter: Payer: Self-pay | Admitting: Family Medicine

## 2017-01-23 VITALS — BP 130/84 | HR 102 | Temp 98.3°F | Ht 74.0 in | Wt 206.0 lb

## 2017-01-23 DIAGNOSIS — Z9081 Acquired absence of spleen: Secondary | ICD-10-CM

## 2017-01-23 DIAGNOSIS — Z8679 Personal history of other diseases of the circulatory system: Secondary | ICD-10-CM | POA: Diagnosis not present

## 2017-01-23 DIAGNOSIS — Z09 Encounter for follow-up examination after completed treatment for conditions other than malignant neoplasm: Secondary | ICD-10-CM | POA: Diagnosis not present

## 2017-01-23 DIAGNOSIS — D62 Acute posthemorrhagic anemia: Secondary | ICD-10-CM

## 2017-01-23 LAB — CBC
HCT: 29.7 % — ABNORMAL LOW (ref 39.0–52.0)
Hemoglobin: 9.2 g/dL — ABNORMAL LOW (ref 13.0–17.0)
MCHC: 31.2 g/dL (ref 30.0–36.0)
MCV: 93.3 fl (ref 78.0–100.0)
PLATELETS: 759 10*3/uL — AB (ref 150.0–400.0)
RBC: 3.18 Mil/uL — AB (ref 4.22–5.81)
RDW: 22.7 % — ABNORMAL HIGH (ref 11.5–15.5)
WBC: 10.9 10*3/uL — ABNORMAL HIGH (ref 4.0–10.5)

## 2017-01-23 LAB — COMPREHENSIVE METABOLIC PANEL
ALBUMIN: 3.9 g/dL (ref 3.5–5.2)
ALT: 17 U/L (ref 0–53)
AST: 16 U/L (ref 0–37)
Alkaline Phosphatase: 98 U/L (ref 39–117)
BILIRUBIN TOTAL: 0.3 mg/dL (ref 0.2–1.2)
BUN: 19 mg/dL (ref 6–23)
CALCIUM: 9.4 mg/dL (ref 8.4–10.5)
CHLORIDE: 100 meq/L (ref 96–112)
CO2: 27 mEq/L (ref 19–32)
CREATININE: 0.84 mg/dL (ref 0.40–1.50)
GFR: 102.44 mL/min (ref >60.00)
Glucose, Bld: 157 mg/dL — ABNORMAL HIGH (ref 70–99)
Potassium: 4.5 mEq/L (ref 3.5–5.1)
Sodium: 134 mEq/L — ABNORMAL LOW (ref 135–145)
Total Protein: 7.9 g/dL (ref 6.0–8.3)

## 2017-01-23 NOTE — Patient Instructions (Addendum)
It was very nice to meet you today- take care and I will be in touch with your labs asap Hopefully we will see your blood counts coming up; depending on your progress we may order a repeat blood count in 1-2 months  Let me know if you have any concerns, and otherwise we can plan to follow-up in 4-6 months assuming all is wellhis

## 2017-04-09 ENCOUNTER — Emergency Department (HOSPITAL_BASED_OUTPATIENT_CLINIC_OR_DEPARTMENT_OTHER): Payer: BLUE CROSS/BLUE SHIELD

## 2017-04-09 ENCOUNTER — Inpatient Hospital Stay (HOSPITAL_BASED_OUTPATIENT_CLINIC_OR_DEPARTMENT_OTHER)
Admission: EM | Admit: 2017-04-09 | Discharge: 2017-04-12 | DRG: 438 | Disposition: A | Discharge status: Home / Self Care | Payer: BLUE CROSS/BLUE SHIELD | Attending: Internal Medicine | Admitting: Internal Medicine

## 2017-04-09 ENCOUNTER — Emergency Department (HOSPITAL_BASED_OUTPATIENT_CLINIC_OR_DEPARTMENT_OTHER)
Admission: EM | Admit: 2017-04-09 | Discharge: 2017-04-09 | Disposition: A | Discharge status: Home / Self Care | Payer: BLUE CROSS/BLUE SHIELD | Source: Home / Self Care | Attending: Emergency Medicine | Admitting: Emergency Medicine

## 2017-04-09 ENCOUNTER — Encounter (HOSPITAL_BASED_OUTPATIENT_CLINIC_OR_DEPARTMENT_OTHER): Payer: Self-pay

## 2017-04-09 ENCOUNTER — Other Ambulatory Visit: Payer: Self-pay

## 2017-04-09 ENCOUNTER — Emergency Department: Payer: BLUE CROSS/BLUE SHIELD

## 2017-04-09 DIAGNOSIS — R1110 Vomiting, unspecified: Secondary | ICD-10-CM | POA: Diagnosis not present

## 2017-04-09 DIAGNOSIS — K219 Gastro-esophageal reflux disease without esophagitis: Secondary | ICD-10-CM | POA: Diagnosis present

## 2017-04-09 DIAGNOSIS — F17210 Nicotine dependence, cigarettes, uncomplicated: Secondary | ICD-10-CM

## 2017-04-09 DIAGNOSIS — F419 Anxiety disorder, unspecified: Secondary | ICD-10-CM | POA: Diagnosis present

## 2017-04-09 DIAGNOSIS — I10 Essential (primary) hypertension: Secondary | ICD-10-CM

## 2017-04-09 DIAGNOSIS — I864 Gastric varices: Secondary | ICD-10-CM | POA: Diagnosis not present

## 2017-04-09 DIAGNOSIS — F4310 Post-traumatic stress disorder, unspecified: Secondary | ICD-10-CM | POA: Diagnosis not present

## 2017-04-09 DIAGNOSIS — I81 Portal vein thrombosis: Secondary | ICD-10-CM | POA: Diagnosis not present

## 2017-04-09 DIAGNOSIS — R1013 Epigastric pain: Secondary | ICD-10-CM | POA: Insufficient documentation

## 2017-04-09 DIAGNOSIS — Z9081 Acquired absence of spleen: Secondary | ICD-10-CM

## 2017-04-09 DIAGNOSIS — K766 Portal hypertension: Secondary | ICD-10-CM | POA: Diagnosis present

## 2017-04-09 DIAGNOSIS — G4700 Insomnia, unspecified: Secondary | ICD-10-CM | POA: Diagnosis not present

## 2017-04-09 DIAGNOSIS — K859 Acute pancreatitis without necrosis or infection, unspecified: Secondary | ICD-10-CM

## 2017-04-09 DIAGNOSIS — Z79899 Other long term (current) drug therapy: Secondary | ICD-10-CM

## 2017-04-09 DIAGNOSIS — K7460 Unspecified cirrhosis of liver: Secondary | ICD-10-CM | POA: Diagnosis not present

## 2017-04-09 DIAGNOSIS — R6510 Systemic inflammatory response syndrome (SIRS) of non-infectious origin without acute organ dysfunction: Secondary | ICD-10-CM | POA: Diagnosis present

## 2017-04-09 DIAGNOSIS — D509 Iron deficiency anemia, unspecified: Secondary | ICD-10-CM | POA: Diagnosis not present

## 2017-04-09 DIAGNOSIS — K2980 Duodenitis without bleeding: Secondary | ICD-10-CM | POA: Diagnosis present

## 2017-04-09 DIAGNOSIS — K852 Alcohol induced acute pancreatitis without necrosis or infection: Secondary | ICD-10-CM | POA: Diagnosis not present

## 2017-04-09 DIAGNOSIS — R188 Other ascites: Secondary | ICD-10-CM | POA: Diagnosis not present

## 2017-04-09 DIAGNOSIS — I748 Embolism and thrombosis of other arteries: Secondary | ICD-10-CM | POA: Diagnosis present

## 2017-04-09 DIAGNOSIS — F1011 Alcohol abuse, in remission: Secondary | ICD-10-CM | POA: Diagnosis present

## 2017-04-09 DIAGNOSIS — K3189 Other diseases of stomach and duodenum: Secondary | ICD-10-CM | POA: Diagnosis present

## 2017-04-09 DIAGNOSIS — K7030 Alcoholic cirrhosis of liver without ascites: Secondary | ICD-10-CM | POA: Diagnosis present

## 2017-04-09 DIAGNOSIS — D649 Anemia, unspecified: Secondary | ICD-10-CM | POA: Diagnosis not present

## 2017-04-09 DIAGNOSIS — F17220 Nicotine dependence, chewing tobacco, uncomplicated: Secondary | ICD-10-CM | POA: Diagnosis present

## 2017-04-09 DIAGNOSIS — K449 Diaphragmatic hernia without obstruction or gangrene: Secondary | ICD-10-CM | POA: Diagnosis not present

## 2017-04-09 DIAGNOSIS — K860 Alcohol-induced chronic pancreatitis: Secondary | ICD-10-CM | POA: Diagnosis present

## 2017-04-09 DIAGNOSIS — R109 Unspecified abdominal pain: Secondary | ICD-10-CM | POA: Diagnosis not present

## 2017-04-09 DIAGNOSIS — K861 Other chronic pancreatitis: Secondary | ICD-10-CM | POA: Diagnosis present

## 2017-04-09 DIAGNOSIS — Z87898 Personal history of other specified conditions: Secondary | ICD-10-CM | POA: Diagnosis not present

## 2017-04-09 LAB — CBC WITH DIFFERENTIAL/PLATELET
BASOS ABS: 0 10*3/uL (ref 0.0–0.1)
BASOS PCT: 0 %
Basophils Absolute: 0.1 10*3/uL (ref 0.0–0.1)
Basophils Relative: 0 %
EOS ABS: 0 10*3/uL (ref 0.0–0.7)
EOS ABS: 0 10*3/uL (ref 0.0–0.7)
EOS PCT: 0 %
EOS PCT: 0 %
HCT: 31.9 % — ABNORMAL LOW (ref 39.0–52.0)
HCT: 33.2 % — ABNORMAL LOW (ref 39.0–52.0)
HEMOGLOBIN: 10.1 g/dL — AB (ref 13.0–17.0)
HEMOGLOBIN: 10.3 g/dL — AB (ref 13.0–17.0)
LYMPHS ABS: 0.9 10*3/uL (ref 0.7–4.0)
LYMPHS PCT: 7 %
Lymphocytes Relative: 8 %
Lymphs Abs: 1.2 10*3/uL (ref 0.7–4.0)
MCH: 24.1 pg — AB (ref 26.0–34.0)
MCH: 24.5 pg — AB (ref 26.0–34.0)
MCHC: 31 g/dL (ref 30.0–36.0)
MCHC: 31.7 g/dL (ref 30.0–36.0)
MCV: 77.4 fL — ABNORMAL LOW (ref 78.0–100.0)
MCV: 77.8 fL — AB (ref 78.0–100.0)
MONOS PCT: 4 %
Monocytes Absolute: 0.5 10*3/uL (ref 0.1–1.0)
Monocytes Absolute: 1.3 10*3/uL — ABNORMAL HIGH (ref 0.1–1.0)
Monocytes Relative: 8 %
NEUTROS PCT: 84 %
NEUTROS PCT: 89 %
Neutro Abs: 11.7 10*3/uL — ABNORMAL HIGH (ref 1.7–7.7)
Neutro Abs: 12.3 10*3/uL — ABNORMAL HIGH (ref 1.7–7.7)
PLATELETS: 527 10*3/uL — AB (ref 150–400)
Platelets: 579 10*3/uL — ABNORMAL HIGH (ref 150–400)
RBC: 4.12 MIL/uL — AB (ref 4.22–5.81)
RBC: 4.27 MIL/uL (ref 4.22–5.81)
RDW: 20.6 % — ABNORMAL HIGH (ref 11.5–15.5)
RDW: 21 % — ABNORMAL HIGH (ref 11.5–15.5)
WBC: 13.1 10*3/uL — AB (ref 4.0–10.5)
WBC: 14.8 10*3/uL — AB (ref 4.0–10.5)

## 2017-04-09 LAB — COMPREHENSIVE METABOLIC PANEL
ALK PHOS: 110 U/L (ref 38–126)
ALT: 20 U/L (ref 17–63)
ANION GAP: 13 (ref 5–15)
AST: 48 U/L — ABNORMAL HIGH (ref 15–41)
Albumin: 4.5 g/dL (ref 3.5–5.0)
BUN: 25 mg/dL — ABNORMAL HIGH (ref 6–20)
CALCIUM: 9.7 mg/dL (ref 8.9–10.3)
CO2: 23 mmol/L (ref 22–32)
CREATININE: 0.99 mg/dL (ref 0.61–1.24)
Chloride: 96 mmol/L — ABNORMAL LOW (ref 101–111)
Glucose, Bld: 173 mg/dL — ABNORMAL HIGH (ref 65–99)
Potassium: 3.9 mmol/L (ref 3.5–5.1)
SODIUM: 132 mmol/L — AB (ref 135–145)
TOTAL PROTEIN: 9.2 g/dL — AB (ref 6.5–8.1)
Total Bilirubin: 0.5 mg/dL (ref 0.3–1.2)

## 2017-04-09 LAB — LIPASE, BLOOD: LIPASE: 297 U/L — AB (ref 11–51)

## 2017-04-09 MED ORDER — HYDROMORPHONE HCL 1 MG/ML IJ SOLN
1.0000 mg | Freq: Once | INTRAMUSCULAR | Status: AC
  Administered 2017-04-09: 1 mg via INTRAVENOUS
  Filled 2017-04-09: qty 1

## 2017-04-09 MED ORDER — ONDANSETRON HCL 4 MG/2ML IJ SOLN
4.0000 mg | Freq: Once | INTRAMUSCULAR | Status: AC
  Administered 2017-04-09: 4 mg via INTRAVENOUS
  Filled 2017-04-09: qty 2

## 2017-04-09 MED ORDER — HYDROMORPHONE HCL 1 MG/ML IJ SOLN
0.5000 mg | Freq: Once | INTRAMUSCULAR | Status: AC
  Administered 2017-04-09: 0.5 mg via INTRAVENOUS
  Filled 2017-04-09: qty 1

## 2017-04-09 MED ORDER — MORPHINE SULFATE 15 MG PO TABS: 15.0000 mg | ORAL_TABLET | ORAL | 0 refills | Status: DC | PRN

## 2017-04-09 MED ORDER — SODIUM CHLORIDE 0.9 % IV BOLUS (SEPSIS)
1000.0000 mL | Freq: Once | INTRAVENOUS | Status: AC
  Administered 2017-04-09: 1000 mL via INTRAVENOUS

## 2017-04-09 MED ORDER — ONDANSETRON 4 MG PO TBDP: ORAL_TABLET | ORAL | 0 refills | Status: DC

## 2017-04-09 MED ORDER — IOPAMIDOL (ISOVUE-300) INJECTION 61%
100.0000 mL | Freq: Once | INTRAVENOUS | Status: AC | PRN
  Administered 2017-04-09: 100 mL via INTRAVENOUS

## 2017-04-09 MED FILL — ONDANSETRON ODT 4 MG TABLET: 4 | 2 days supply | Qty: 9 | Fill #0

## 2017-04-09 MED FILL — MORPHINE SULFATE IR 15 MG T: 15 | 2 days supply | Qty: 10 | Fill #0

## 2017-04-09 NOTE — ED Triage Notes (Signed)
C/o abd, back pain "my pancreas"-n/v since 4am

## 2017-04-09 NOTE — ED Notes (Signed)
ED Provider at bedside. 

## 2017-04-09 NOTE — ED Notes (Signed)
Pt states he drove self, md made aware , pending admission, narcotics given

## 2017-04-09 NOTE — Discharge Instructions (Signed)
Return for sudden worsening pain, fever, inability to eat or drink.

## 2017-04-09 NOTE — ED Triage Notes (Signed)
Pt was just discharged this afternoon, is back because he cannot tolerate his PO pain meds despite using the zofran that was given as well

## 2017-04-09 NOTE — ED Provider Notes (Signed)
[facility] EMERGENCY DEPARTMENT Provider Note   CSN: [id number] Arrival date & time: 04/09/17  1141     History   Chief Complaint Chief Complaint  Patient presents with  . Abdominal Pain    HPI Samuel Huffman is a 51 y.o. male.  51 yo M with a chief complaint of epigastric abdominal pain. Going on since yesterday. Having some vomiting and nausea as well. Feels like his prior pancreatitis. Goes from his umbilicus into his back. Denies fevers or chills. Denies diarrhea. Denies sick contacts. Pancreatitis was initially caused by alcoholism though he is quit drinking. Denies recent alcohol use.   The history is provided by the patient.  Abdominal Pain   This is a recurrent problem. The current episode started yesterday. The problem occurs constantly. The problem has been rapidly worsening. The pain is associated with an unknown factor. The pain is located in the epigastric region. The quality of the pain is shooting and sharp. The pain is at a severity of 10/10. The pain is severe. Associated symptoms include nausea and vomiting. Pertinent negatives include fever, diarrhea, headaches, arthralgias and myalgias. Nothing aggravates the symptoms. Nothing relieves the symptoms. Past medical history comments: pancreatitis.    Past Medical History:  Diagnosis Date  . Acute upper GI bleeding 08/02/2016  . Anxiety   . Blood transfusion without reported diagnosis   . Cirrhosis of liver with ascites (HCC)   . Hiatal hernia   . History of alcohol abuse   . Hypertension   . Insomnia   . Pancreatic pseudocyst   . Pancreatitis   . Portal hypertensive gastropathy (HCC)   . PTSD (post-traumatic stress disorder)   . Pulmonary nodule   . Varices, gastric     Patient Active Problem List   Diagnosis Date Noted  . S/P laparoscopy 12/21/2016  . Bleeding gastric varices   . Hyponatremia 08/04/2016  . Hypokalemia 08/04/2016  . Abnormal CT scan, stomach   . Upper GI bleed 08/02/2016    . Hematemesis 08/02/2016  . Pancreatic pseudocyst   . Pulmonary nodule   . Acute recurrent pancreatitis 10/01/2014  . AKI (acute kidney injury) (HCC) 10/01/2014  . History of alcohol abuse 10/01/2014  . Lactic acidosis 10/01/2014  . High anion gap metabolic acidosis 10/01/2014  . Insomnia 07/05/2014    Past Surgical History:  Procedure Laterality Date  . ESOPHAGOGASTRODUODENOSCOPY N/A 12/18/2016   Procedure: ESOPHAGOGASTRODUODENOSCOPY (EGD);  Surgeon: Hilarie Fredrickson, MD;  [facility];  Service: Endoscopy;  Laterality: N/A;  . ESOPHAGOGASTRODUODENOSCOPY (EGD) WITH PROPOFOL N/A 08/03/2016   Procedure: ESOPHAGOGASTRODUODENOSCOPY (EGD) WITH PROPOFOL;  Surgeon: Rachael Fee, MD;  [facility];  Service: Endoscopy;  Laterality: N/A;  . EUS  08/03/2016   Procedure: UPPER ENDOSCOPIC ULTRASOUND (EUS) LINEAR;  Surgeon: Rachael Fee, MD;  [facility];  Service: Endoscopy;;  . FEMUR SURGERY Left   . LAPAROSCOPY N/A 12/21/2016   Procedure: LAPAROSCOPY LYSIS OF ADHESIONS, EXPLORATORY LAPAROTOMY, SPLENECTOMY;  Surgeon: Avel Peace, MD;  Location: [facility];  Service: General;  Laterality: N/A;  . SPLENECTOMY         Home Medications    Prior to Admission medications   Medication Sig Start Date End Date Taking? Authorizing Provider  morphine (MSIR) 15 MG tablet Take 1 tablet (15 mg total) by mouth every 4 (four) hours as needed for severe pain. 04/09/17   Melene Plan, DO  ondansetron (ZOFRAN ODT) 4 MG disintegrating tablet  ODT q4 hours prn nausea/vomit 04/09/17  Melene Plan, DO  pantoprazole (PROTONIX) 40 MG tablet Take 1 tablet (40 mg total) by mouth daily at 6 (six) AM. 10/15/16   Rachael Fee, MD    Family History Family History  Problem Relation Age of Onset  . Adopted: Yes  . Healthy Son        x1    Social History Social History  Substance Use Topics  . Smoking status: Current Every Day Smoker    Packs/day: 0.50    Types: Cigarettes  .  Smokeless tobacco: Current User    Types: Snuff  . Alcohol use No     Allergies   Patient has no known allergies.   Review of Systems Review of Systems  Constitutional: Negative for chills and fever.  HENT: Negative for congestion and facial swelling.   Eyes: Negative for discharge and visual disturbance.  Respiratory: Negative for shortness of breath.   Cardiovascular: Negative for chest pain and palpitations.  Gastrointestinal: Positive for abdominal pain, nausea and vomiting. Negative for diarrhea.  Musculoskeletal: Negative for arthralgias and myalgias.  Skin: Negative for color change and rash.  Neurological: Negative for tremors, syncope and headaches.  Psychiatric/Behavioral: Negative for confusion and dysphoric mood.     Physical Exam Updated Vital Signs BP (!) 161/96 (BP Location: Left Arm)   Pulse 86   Temp 97.8 F (36.6 C) (Oral)   Resp 20   Ht  (1.905 m)   Wt 95.3 kg (210 lb)   SpO2 100%   BMI 26.25 kg/m   Physical Exam  Constitutional: He is oriented to person, place, and time. He appears well-developed and well-nourished.  HENT:  Head: Normocephalic and atraumatic.  Eyes: Pupils are equal, round, and reactive to light. EOM are normal.  Neck: Normal range of motion. Neck supple. No JVD present.  Cardiovascular: Normal rate and regular rhythm.  Exam reveals no gallop and no friction rub.   No murmur heard. Pulmonary/Chest: No respiratory distress. He has no wheezes.  Abdominal: He exhibits no distension. There is tenderness. There is guarding (RUQ with + murphys). There is no rebound.  Musculoskeletal: Normal range of motion.  Neurological: He is alert and oriented to person, place, and time.  Skin: No rash noted. No pallor.  Psychiatric: He has a normal mood and affect. His behavior is normal.  Nursing note and vitals reviewed.    ED Treatments / Results  Labs (all labs ordered are listed, but only abnormal results are displayed) Labs  Reviewed  CBC WITH DIFFERENTIAL/PLATELET - Abnormal; Notable for the following:       Result Value   WBC 13.1 (*)    Hemoglobin 10.3 (*)    HCT 33.2 (*)    MCV 77.8 (*)    MCH 24.1 (*)    RDW 21.0 (*)    Platelets 579 (*)    Neutro Abs 11.7 (*)    All other components within normal limits  COMPREHENSIVE METABOLIC PANEL - Abnormal; Notable for the following:    Sodium 132 (*)    Chloride 96 (*)    Glucose, Bld 173 (*)    BUN 25 (*)    Total Protein 9.2 (*)    AST 48 (*)    All other components within normal limits  LIPASE, BLOOD - Abnormal; Notable for the following:    Lipase 297 (*)    All other components within normal limits    EKG  EKG Interpretation None       Radiology Ct  Abdomen Pelvis W Contrast  Result Date: 04/09/2017 CLINICAL DATA:  Abdominal pain radiating to the back with nausea and vomiting EXAM: CT ABDOMEN AND PELVIS WITH CONTRAST TECHNIQUE: Multidetector CT imaging of the abdomen and pelvis was performed using the standard protocol following bolus administration of intravenous contrast. CONTRAST:  ISOVUE-300 IOPAMIDOL (ISOVUE-300) INJECTION 61% COMPARISON:  12/19/2016, 08/01/2016 FINDINGS: Lower chest: Lung bases demonstrate no acute consolidation or pleural effusion. Hepatobiliary: Subtle micro nodularity of the contour. No focal hepatic abnormality. Mild steatosis. No calcified gallstones or biliary dilatation. Pancreas: Scattered calcifications consistent with chronic pancreatitis. Slight prominence of the pancreatic duct. Decreased hypodensity at the tail of the pancreas. Mild to moderate fluid and edema surrounding the head, neck, and uncinate process of the pancreas with poorly defined appearance of pancreas suspicious for pancreatitis. No increasing focal fluid collections at the pancreas bed. Spleen: Interval splenectomy. Residual soft tissue density in the left upper quadrant may reflect resolving postoperative change versus splenule.  Adrenals/Urinary Tract: Adrenal glands are within normal limits. Kidneys show no hydronephrosis. Subcentimeter hypodensity in the right kidney too small to further characterize. Bladder within normal limits. Stomach/Bowel: Stomach is nonenlarged. Further decrease in the focal Cutrona thickening of the proximal stomach. Mild inflammatory change at the proximal duodenum. No dilated small bowel. No colon Kutner thickening. High density material in the appendix but no inflammation. Vascular/Lymphatic: Aortic atherosclerosis. Nonspecific epigastric and retroperitoneal subcentimeter lymph nodes. 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 Normal enhancement of the portal vein. Suspected marked narrowing or partial occlusion of the SMV near its expected confluence with the splenic vein. Reproductive: Slightly enlarged prostate. Other: Negative for free air.  Small fat in the left inguinal canal. Musculoskeletal: Old postsurgical and posttraumatic deformity of the proximal left femur. IMPRESSION: 1. Ill-defined appearance of the pancreas with moderate fluid and edema surrounding the pancreatic head, neck hands uncinate process suspicious for acute pancreatitis. Multiple pancreatic calcifications consistent with sequela of chronic pancreatitis. No discrete organized fluid collections at the pancreas on the current exam. 2. Interval splenectomy. Residual soft tissue density and fluid in the left upper quadrant may relate to splenule or resolving postsurgical changes. 3. 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 Suspected marked narrowing or partial occlusion of the proximal SMV at the expected location of splenic vein confluence. Normal portal vein enhancement. 4. Hepatic steatosis. Query micro nodularity of the liver contour as may be seen with cirrhosis. 5. Decreased gastric Finch thickening since the prior study. Mild duodenum thickening may relate to  duodenitis Electronically Signed   By: Jasmine Pang M.D.   On: 04/09/2017 14:25    Procedures Procedures (including critical care time)  Medications Ordered in ED Medications  sodium chloride 0.9 % bolus 1,000 mL (0 mLs Intravenous Stopped 04/09/17 1315)  ondansetron (ZOFRAN) injection 4 mg (4 mg Intravenous Given 04/09/17 1215)  HYDROmorphone (DILAUDID) injection 1 mg (1 mg Intravenous Given 04/09/17 1241)  HYDROmorphone (DILAUDID) injection 0.5 mg (0.5 mg Intravenous Given 04/09/17 1336)  ondansetron (ZOFRAN) injection 4 mg (4 mg Intravenous Given 04/09/17 1336)  sodium chloride 0.9 % bolus 1,000 mL (0 mLs Intravenous Stopped 04/09/17 1428)  iopamidol (ISOVUE-300) 61 % injection 100 mL (100 mLs Intravenous Contrast Given 04/09/17 1347)  HYDROmorphone (DILAUDID) injection 1 mg (1 mg Intravenous Given 04/09/17 1446)  ondansetron (ZOFRAN) injection 4 mg (4 mg Intravenous Given 04/09/17 1444)     Initial Impression / Assessment and Plan / ED Course  I have reviewed the triage  vital signs and the nursing notes.  Pertinent labs & imaging results that were available during my care of the patient were reviewed by me and considered in my medical decision making (see chart for details).     51 yo M with a chief complaint of epigastric abdominal pain. History of pancreatitis feels like it's the same. Lipase elevated consistent with pancreatitis. Patient with some guarding and positive Murphy sign in the right upper quadrant. Will obtain a CT scan to further evaluate.  CT without complication. Patient is able to tolerate by mouth without difficulty. At this time we'll try outpatient follow-up. Give pain and nausea meds.   Feeling better.  D/c home.   3:40 PM:  I have discussed the diagnosis/risks/treatment options with the patient and believe the pt to be eligible for discharge home to follow-up with PCP. We also discussed returning to the ED immediately if new or worsening sx occur. We  discussed the sx which are most concerning (e.g., sudden worsening pain, fever, inability to tolerate by mouth) that necessitate immediate return. Medications administered to the patient during their visit and any new prescriptions provided to the patient are listed below.  Medications given during this visit Medications  sodium chloride 0.9 % bolus 1,000 mL (0 mLs Intravenous Stopped 04/09/17 1315)  ondansetron (ZOFRAN) injection 4 mg (4 mg Intravenous Given 04/09/17 1215)  HYDROmorphone (DILAUDID) injection 1 mg (1 mg Intravenous Given 04/09/17 1241)  HYDROmorphone (DILAUDID) injection 0.5 mg (0.5 mg Intravenous Given 04/09/17 1336)  ondansetron (ZOFRAN) injection 4 mg (4 mg Intravenous Given 04/09/17 1336)  sodium chloride 0.9 % bolus 1,000 mL (0 mLs Intravenous Stopped 04/09/17 1428)  iopamidol (ISOVUE-300) 61 % injection 100 mL (100 mLs Intravenous Contrast Given 04/09/17 1347)  HYDROmorphone (DILAUDID) injection 1 mg (1 mg Intravenous Given 04/09/17 1446)  ondansetron (ZOFRAN) injection 4 mg (4 mg Intravenous Given 04/09/17 1444)     The patient appears reasonably screen and/or stabilized for discharge and I doubt any other medical condition or other [facility] requiring further screening, evaluation, or treatment in the ED at this time prior to discharge.      Final Clinical Impressions(s) / ED Diagnoses   Final diagnoses:  Epigastric pain  Acute pancreatitis without infection or necrosis, unspecified pancreatitis type    New Prescriptions New Prescriptions   MORPHINE (MSIR) 15 MG TABLET    Take 1 tablet (15 mg total) by mouth every 4 (four) hours as needed for severe pain.   ONDANSETRON (ZOFRAN ODT) 4 MG DISINTEGRATING TABLET     ODT q4 hours prn nausea/vomit     Melene Plan, DO 04/09/17 1540

## 2017-04-09 NOTE — ED Provider Notes (Signed)
[facility] EMERGENCY DEPARTMENT Provider Note   CSN: [id number] Arrival date & time: 04/09/17  2309     History   Chief Complaint Chief Complaint  Patient presents with  . Emesis    HPI Samuel Huffman is a 51 y.o. male.  Patient is a 51 year old male with history of recurrent pancreatitis, pancreatic pseudocyst, and cirrhosis. He presents today for evaluation of severe epigastric pain. He was seen earlier today and had laboratory studies and CT scan. Both were consistent with acute pancreatitis. He was feeling better, then sent home with pain and nausea medicine. Since returning home, he has been unable to tolerate by mouth intake. He has been unable to take his medications and keep them down.    The history is provided by the patient.  Emesis   This is a recurrent problem. The current episode started yesterday. The problem occurs continuously. The problem has been rapidly worsening. The emesis has an appearance of stomach contents. There has been no fever. Associated symptoms include abdominal pain. Pertinent negatives include no chills, no diarrhea and no fever.    Past Medical History:  Diagnosis Date  . Acute upper GI bleeding 08/02/2016  . Anxiety   . Blood transfusion without reported diagnosis   . Cirrhosis of liver with ascites (HCC)   . Hiatal hernia   . History of alcohol abuse   . Hypertension   . Insomnia   . Pancreatic pseudocyst   . Pancreatitis   . Portal hypertensive gastropathy (HCC)   . PTSD (post-traumatic stress disorder)   . Pulmonary nodule   . Varices, gastric     Patient Active Problem List   Diagnosis Date Noted  . S/P laparoscopy 12/21/2016  . Bleeding gastric varices   . Hyponatremia 08/04/2016  . Hypokalemia 08/04/2016  . Abnormal CT scan, stomach   . Upper GI bleed 08/02/2016  . Hematemesis 08/02/2016  . Pancreatic pseudocyst   . Pulmonary nodule   . Acute recurrent pancreatitis 10/01/2014  . AKI (acute kidney injury)  (HCC) 10/01/2014  . History of alcohol abuse 10/01/2014  . Lactic acidosis 10/01/2014  . High anion gap metabolic acidosis 10/01/2014  . Insomnia 07/05/2014    Past Surgical History:  Procedure Laterality Date  . ESOPHAGOGASTRODUODENOSCOPY N/A 12/18/2016   Procedure: ESOPHAGOGASTRODUODENOSCOPY (EGD);  Surgeon: Hilarie Fredrickson, MD;  Location: [facility] ENDOSCOPY;  Service: Endoscopy;  Laterality: N/A;  . ESOPHAGOGASTRODUODENOSCOPY (EGD) WITH PROPOFOL N/A 08/03/2016   Procedure: ESOPHAGOGASTRODUODENOSCOPY (EGD) WITH PROPOFOL;  Surgeon: Rachael Fee, MD;  Location: [facility] ENDOSCOPY;  Service: Endoscopy;  Laterality: N/A;  . EUS  08/03/2016   Procedure: UPPER ENDOSCOPIC ULTRASOUND (EUS) LINEAR;  Surgeon: Rachael Fee, MD;  Location: [facility] ENDOSCOPY;  Service: Endoscopy;;  . FEMUR SURGERY Left   . LAPAROSCOPY N/A 12/21/2016   Procedure: LAPAROSCOPY LYSIS OF ADHESIONS, EXPLORATORY LAPAROTOMY, SPLENECTOMY;  Surgeon: Avel Peace, MD;  Location: [facility] ORS;  Service: General;  Laterality: N/A;  . SPLENECTOMY         Home Medications    Prior to Admission medications   Medication Sig Start Date End Date Taking? Authorizing Provider  morphine (MSIR) 15 MG tablet Take 1 tablet (15 mg total) by mouth every 4 (four) hours as needed for severe pain. 04/09/17   Melene Plan, DO  ondansetron (ZOFRAN ODT) 4 MG disintegrating tablet  ODT q4 hours prn nausea/vomit 04/09/17   Melene Plan, DO  pantoprazole (PROTONIX) 40 MG tablet Take 1 tablet (40 mg total) by mouth daily at  6 (six) AM. 10/15/16   Rachael Fee, MD    Family History Family History  Problem Relation Age of Onset  . Adopted: Yes  . Healthy Son        x1    Social History Social History  Substance Use Topics  . Smoking status: Current Every Day Smoker    Packs/day: 0.50    Types: Cigarettes  . Smokeless tobacco: Current User    Types: Snuff  . Alcohol use No     Allergies   Patient has no known allergies.   Review of  Systems Review of Systems  Constitutional: Negative for chills and fever.  Gastrointestinal: Positive for abdominal pain and vomiting. Negative for diarrhea.  All other systems reviewed and are negative.    Physical Exam Updated Vital Signs BP (!) 163/101 (BP Location: Left Arm)   Pulse (!) 129   Temp 98.2 F (36.8 C) (Oral)   Resp (!) 22   Ht  (1.905 m)   Wt 95.3 kg (210 lb)   SpO2 100%   BMI 26.25 kg/m   Physical Exam  Constitutional: He is oriented to person, place, and time. He appears well-developed and well-nourished. No distress.  HENT:  Head: Normocephalic and atraumatic.  Mouth/Throat: Oropharynx is clear and moist.  Neck: Normal range of motion. Neck supple.  Cardiovascular: Normal rate and regular rhythm.  Exam reveals no friction rub.   No murmur heard. Pulmonary/Chest: Effort normal and breath sounds normal. No respiratory distress. He has no wheezes. He has no rales.  Abdominal: Soft. Bowel sounds are normal. He exhibits no distension. There is tenderness. There is no rebound and no guarding.  There is tenderness to palpation in the epigastric region.  Musculoskeletal: Normal range of motion. He exhibits no edema.  Neurological: He is alert and oriented to person, place, and time. Coordination normal.  Skin: Skin is warm and dry. He is not diaphoretic.  Nursing note and vitals reviewed.    ED Treatments / Results  Labs (all labs ordered are listed, but only abnormal results are displayed) Labs Reviewed  COMPREHENSIVE METABOLIC PANEL  CBC WITH DIFFERENTIAL/PLATELET  LIPASE, BLOOD    EKG  EKG Interpretation None       Radiology Ct Abdomen Pelvis W Contrast  Result Date: 04/09/2017 CLINICAL DATA:  Abdominal pain radiating to the back with nausea and vomiting EXAM: CT ABDOMEN AND PELVIS WITH CONTRAST TECHNIQUE: Multidetector CT imaging of the abdomen and pelvis was performed using the standard protocol following bolus administration of  intravenous contrast. CONTRAST:  ISOVUE-300 IOPAMIDOL (ISOVUE-300) INJECTION 61% COMPARISON:  12/19/2016, 08/01/2016 FINDINGS: Lower chest: Lung bases demonstrate no acute consolidation or pleural effusion. Hepatobiliary: Subtle micro nodularity of the contour. No focal hepatic abnormality. Mild steatosis. No calcified gallstones or biliary dilatation. Pancreas: Scattered calcifications consistent with chronic pancreatitis. Slight prominence of the pancreatic duct. Decreased hypodensity at the tail of the pancreas. Mild to moderate fluid and edema surrounding the head, neck, and uncinate process of the pancreas with poorly defined appearance of pancreas suspicious for pancreatitis. No increasing focal fluid collections at the pancreas bed. Spleen: Interval splenectomy. Residual soft tissue density in the left upper quadrant may reflect resolving postoperative change versus splenule. Adrenals/Urinary Tract: Adrenal glands are within normal limits. Kidneys show no hydronephrosis. Subcentimeter hypodensity in the right kidney too small to further characterize. Bladder within normal limits. Stomach/Bowel: Stomach is nonenlarged. Further decrease in the focal Maggart thickening of the proximal stomach. Mild inflammatory change at  the proximal duodenum. No dilated small bowel. No colon North thickening. High density material in the appendix but no inflammation. Vascular/Lymphatic: Aortic atherosclerosis. Nonspecific epigastric and retroperitoneal subcentimeter lymph nodes. 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 Normal enhancement of the portal vein. Suspected marked narrowing or partial occlusion of the SMV near its expected confluence with the splenic vein. Reproductive: Slightly enlarged prostate. Other: Negative for free air.  Small fat in the left inguinal canal. Musculoskeletal: Old postsurgical and posttraumatic deformity of the proximal left femur. IMPRESSION: 1. Ill-defined  appearance of the pancreas with moderate fluid and edema surrounding the pancreatic head, neck hands uncinate process suspicious for acute pancreatitis. Multiple pancreatic calcifications consistent with sequela of chronic pancreatitis. No discrete organized fluid collections at the pancreas on the current exam. 2. Interval splenectomy. Residual soft tissue density and fluid in the left upper quadrant may relate to splenule or resolving postsurgical changes. 3. 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 Suspected marked narrowing or partial occlusion of the proximal SMV at the expected location of splenic vein confluence. Normal portal vein enhancement. 4. Hepatic steatosis. Query micro nodularity of the liver contour as may be seen with cirrhosis. 5. Decreased gastric Myhre thickening since the prior study. Mild duodenum thickening may relate to duodenitis Electronically Signed   By: Jasmine Pang M.D.   On: 04/09/2017 14:25    Procedures Procedures (including critical care time)  Medications Ordered in ED Medications  sodium chloride 0.9 % bolus 1,000 mL (not administered)  HYDROmorphone (DILAUDID) injection 1 mg (not administered)  ondansetron (ZOFRAN) injection 4 mg (not administered)     Initial Impression / Assessment and Plan / ED Course  I have reviewed the triage vital signs and the nursing notes.  Pertinent labs & imaging results that were available during my care of the patient were reviewed by me and considered in my medical decision making (see chart for details).  Patient diagnosed with pancreatitis by blood and CT scan earlier today He returns complaining of worsening pain and being unable to keep down his medications. His lipase is actually improving this evening. Due to his inability to tolerate PO and intractable pain, he will be admitted to [facility] under the care of Dr. Clyde Lundborg.  Final Clinical Impressions(s) / ED Diagnoses   Final diagnoses:   None    New Prescriptions New Prescriptions   No medications on file     Geoffery Lyons, MD 04/10/17 [phone]

## 2017-04-10 ENCOUNTER — Encounter (HOSPITAL_BASED_OUTPATIENT_CLINIC_OR_DEPARTMENT_OTHER): Payer: Self-pay | Admitting: Internal Medicine

## 2017-04-10 ENCOUNTER — Inpatient Hospital Stay (HOSPITAL_COMMUNITY): Payer: BLUE CROSS/BLUE SHIELD

## 2017-04-10 DIAGNOSIS — D649 Anemia, unspecified: Secondary | ICD-10-CM | POA: Diagnosis not present

## 2017-04-10 DIAGNOSIS — K852 Alcohol induced acute pancreatitis without necrosis or infection: Secondary | ICD-10-CM | POA: Diagnosis present

## 2017-04-10 DIAGNOSIS — I748 Embolism and thrombosis of other arteries: Secondary | ICD-10-CM | POA: Diagnosis present

## 2017-04-10 DIAGNOSIS — K2980 Duodenitis without bleeding: Secondary | ICD-10-CM | POA: Diagnosis present

## 2017-04-10 DIAGNOSIS — F4310 Post-traumatic stress disorder, unspecified: Secondary | ICD-10-CM | POA: Diagnosis present

## 2017-04-10 DIAGNOSIS — K766 Portal hypertension: Secondary | ICD-10-CM | POA: Diagnosis present

## 2017-04-10 DIAGNOSIS — Z87898 Personal history of other specified conditions: Secondary | ICD-10-CM | POA: Diagnosis not present

## 2017-04-10 DIAGNOSIS — K859 Acute pancreatitis without necrosis or infection, unspecified: Secondary | ICD-10-CM | POA: Diagnosis not present

## 2017-04-10 DIAGNOSIS — R6510 Systemic inflammatory response syndrome (SIRS) of non-infectious origin without acute organ dysfunction: Secondary | ICD-10-CM | POA: Diagnosis present

## 2017-04-10 DIAGNOSIS — K7030 Alcoholic cirrhosis of liver without ascites: Secondary | ICD-10-CM | POA: Diagnosis not present

## 2017-04-10 DIAGNOSIS — K3189 Other diseases of stomach and duodenum: Secondary | ICD-10-CM | POA: Diagnosis present

## 2017-04-10 DIAGNOSIS — K219 Gastro-esophageal reflux disease without esophagitis: Secondary | ICD-10-CM | POA: Diagnosis present

## 2017-04-10 DIAGNOSIS — K7460 Unspecified cirrhosis of liver: Secondary | ICD-10-CM | POA: Diagnosis not present

## 2017-04-10 DIAGNOSIS — Z9081 Acquired absence of spleen: Secondary | ICD-10-CM | POA: Diagnosis not present

## 2017-04-10 DIAGNOSIS — R188 Other ascites: Secondary | ICD-10-CM

## 2017-04-10 DIAGNOSIS — K449 Diaphragmatic hernia without obstruction or gangrene: Secondary | ICD-10-CM | POA: Diagnosis present

## 2017-04-10 DIAGNOSIS — I864 Gastric varices: Secondary | ICD-10-CM | POA: Diagnosis present

## 2017-04-10 DIAGNOSIS — F17220 Nicotine dependence, chewing tobacco, uncomplicated: Secondary | ICD-10-CM | POA: Diagnosis present

## 2017-04-10 DIAGNOSIS — Z79899 Other long term (current) drug therapy: Secondary | ICD-10-CM | POA: Diagnosis not present

## 2017-04-10 DIAGNOSIS — D509 Iron deficiency anemia, unspecified: Secondary | ICD-10-CM

## 2017-04-10 DIAGNOSIS — F1011 Alcohol abuse, in remission: Secondary | ICD-10-CM | POA: Diagnosis present

## 2017-04-10 DIAGNOSIS — F17210 Nicotine dependence, cigarettes, uncomplicated: Secondary | ICD-10-CM | POA: Diagnosis present

## 2017-04-10 DIAGNOSIS — I10 Essential (primary) hypertension: Secondary | ICD-10-CM | POA: Diagnosis present

## 2017-04-10 DIAGNOSIS — I81 Portal vein thrombosis: Secondary | ICD-10-CM | POA: Diagnosis present

## 2017-04-10 DIAGNOSIS — K860 Alcohol-induced chronic pancreatitis: Secondary | ICD-10-CM | POA: Diagnosis present

## 2017-04-10 DIAGNOSIS — G4700 Insomnia, unspecified: Secondary | ICD-10-CM | POA: Diagnosis present

## 2017-04-10 DIAGNOSIS — F419 Anxiety disorder, unspecified: Secondary | ICD-10-CM | POA: Diagnosis present

## 2017-04-10 LAB — COMPREHENSIVE METABOLIC PANEL
ALBUMIN: 4.3 g/dL (ref 3.5–5.0)
ALK PHOS: 104 U/L (ref 38–126)
ALT: 17 U/L (ref 17–63)
ANION GAP: 11 (ref 5–15)
AST: 34 U/L (ref 15–41)
BILIRUBIN TOTAL: 0.8 mg/dL (ref 0.3–1.2)
BUN: 19 mg/dL (ref 6–20)
CO2: 24 mmol/L (ref 22–32)
Calcium: 9.3 mg/dL (ref 8.9–10.3)
Chloride: 98 mmol/L — ABNORMAL LOW (ref 101–111)
Creatinine, Ser: 0.9 mg/dL (ref 0.61–1.24)
GFR calc Af Amer: >60 mL/min (ref >60)
GFR calc non Af Amer: >60 mL/min (ref >60)
GLUCOSE: 153 mg/dL — AB (ref 65–99)
POTASSIUM: 3.9 mmol/L (ref 3.5–5.1)
Sodium: 133 mmol/L — ABNORMAL LOW (ref 135–145)
TOTAL PROTEIN: 8.7 g/dL — AB (ref 6.5–8.1)

## 2017-04-10 LAB — BASIC METABOLIC PANEL
Anion gap: 6 (ref 5–15)
BUN: 14 mg/dL (ref 6–20)
CALCIUM: 8.9 mg/dL (ref 8.9–10.3)
CO2: 28 mmol/L (ref 22–32)
CREATININE: 0.77 mg/dL (ref 0.61–1.24)
Chloride: 100 mmol/L — ABNORMAL LOW (ref 101–111)
GFR calc Af Amer: >60 mL/min (ref >60)
GLUCOSE: 131 mg/dL — AB (ref 65–99)
Potassium: 3.6 mmol/L (ref 3.5–5.1)
Sodium: 134 mmol/L — ABNORMAL LOW (ref 135–145)

## 2017-04-10 LAB — LIPASE, BLOOD: Lipase: 155 U/L — ABNORMAL HIGH (ref 11–51)

## 2017-04-10 LAB — IRON AND TIBC
IRON: 32 ug/dL — AB (ref 45–182)
SATURATION RATIOS: 6 % — AB (ref 17.9–39.5)
TIBC: 508 ug/dL — ABNORMAL HIGH (ref 250–450)
UIBC: 476 ug/dL

## 2017-04-10 LAB — PROTIME-INR
INR: 0.89
Prothrombin Time: 11.9 seconds (ref 11.4–15.2)

## 2017-04-10 LAB — CBC
HCT: 32.4 % — ABNORMAL LOW (ref 39.0–52.0)
Hemoglobin: 10 g/dL — ABNORMAL LOW (ref 13.0–17.0)
MCH: 24.2 pg — AB (ref 26.0–34.0)
MCHC: 30.9 g/dL (ref 30.0–36.0)
MCV: 78.5 fL (ref 78.0–100.0)
PLATELETS: 496 10*3/uL — AB (ref 150–400)
RBC: 4.13 MIL/uL — ABNORMAL LOW (ref 4.22–5.81)
RDW: 20.5 % — AB (ref 11.5–15.5)
WBC: 13.5 10*3/uL — AB (ref 4.0–10.5)

## 2017-04-10 LAB — RETICULOCYTES
RBC.: 3.89 MIL/uL — ABNORMAL LOW (ref 4.22–5.81)
Retic Count, Absolute: 38.9 10*3/uL (ref 19.0–186.0)
Retic Ct Pct: 1 % (ref 0.4–3.1)

## 2017-04-10 LAB — VITAMIN B12: Vitamin B-12: 384 pg/mL (ref 180–914)

## 2017-04-10 LAB — PROCALCITONIN: Procalcitonin: <0.1 ng/mL

## 2017-04-10 LAB — LIPID PANEL
Cholesterol: 254 mg/dL — ABNORMAL HIGH (ref 0–200)
HDL: 74 mg/dL (ref >40)
LDL CALC: 141 mg/dL — AB (ref 0–99)
TRIGLYCERIDES: 194 mg/dL — AB (ref <150)
Total CHOL/HDL Ratio: 3.4 RATIO
VLDL: 39 mg/dL (ref 0–40)

## 2017-04-10 LAB — LACTIC ACID, PLASMA
Lactic Acid, Venous: 0.9 mmol/L (ref 0.5–1.9)
Lactic Acid, Venous: 0.9 mmol/L (ref 0.5–1.9)

## 2017-04-10 LAB — APTT: aPTT: 30 seconds (ref 24–36)

## 2017-04-10 LAB — FERRITIN: Ferritin: 24 ng/mL (ref 24–336)

## 2017-04-10 LAB — FOLATE: FOLATE: 39 ng/mL (ref >5.9)

## 2017-04-10 LAB — AMMONIA: Ammonia: 14 umol/L (ref 9–35)

## 2017-04-10 MED ORDER — NICOTINE 21 MG/24HR TD PT24
21.0000 mg | MEDICATED_PATCH | Freq: Every day | TRANSDERMAL | Status: DC
  Administered 2017-04-10 – 2017-04-11 (×2): 21 mg via TRANSDERMAL
  Filled 2017-04-10 (×3): qty 1

## 2017-04-10 MED ORDER — ACETAMINOPHEN 650 MG RE SUPP: 650.0000 mg | Freq: Four times a day (QID) | RECTAL | Status: DC | PRN

## 2017-04-10 MED ORDER — ACETAMINOPHEN 325 MG PO TABS: 650.0000 mg | ORAL_TABLET | Freq: Four times a day (QID) | ORAL | Status: DC | PRN

## 2017-04-10 MED ORDER — SODIUM CHLORIDE 0.9 % IV BOLUS (SEPSIS)
1000.0000 mL | Freq: Once | INTRAVENOUS | Status: AC
  Administered 2017-04-10: 1000 mL via INTRAVENOUS

## 2017-04-10 MED ORDER — HYDROMORPHONE HCL 1 MG/ML IJ SOLN
1.0000 mg | Freq: Once | INTRAMUSCULAR | Status: AC
  Administered 2017-04-10: 1 mg via INTRAVENOUS
  Filled 2017-04-10: qty 1

## 2017-04-10 MED ORDER — ZOLPIDEM TARTRATE 5 MG PO TABS: 5.0000 mg | ORAL_TABLET | Freq: Every evening | ORAL | Status: DC | PRN

## 2017-04-10 MED ORDER — SODIUM CHLORIDE 0.9 % IV SOLN
INTRAVENOUS | Status: DC
  Administered 2017-04-10 – 2017-04-12 (×4): via INTRAVENOUS

## 2017-04-10 MED ORDER — HYDRALAZINE HCL 20 MG/ML IJ SOLN: 5.0000 mg | INTRAMUSCULAR | Status: DC | PRN

## 2017-04-10 MED ORDER — PANTOPRAZOLE SODIUM 40 MG PO TBEC
40.0000 mg | DELAYED_RELEASE_TABLET | Freq: Every day | ORAL | Status: DC
  Administered 2017-04-10 – 2017-04-12 (×3): 40 mg via ORAL
  Filled 2017-04-10 (×3): qty 1

## 2017-04-10 MED ORDER — ONDANSETRON HCL 4 MG/2ML IJ SOLN: 4.0000 mg | Freq: Three times a day (TID) | INTRAMUSCULAR | Status: DC | PRN

## 2017-04-10 MED ORDER — DIPHENHYDRAMINE HCL 50 MG/ML IJ SOLN
25.0000 mg | Freq: Four times a day (QID) | INTRAMUSCULAR | Status: DC | PRN
  Administered 2017-04-10 – 2017-04-11 (×3): 25 mg via INTRAVENOUS
  Filled 2017-04-10 (×3): qty 1

## 2017-04-10 MED ORDER — ENOXAPARIN SODIUM 40 MG/0.4ML ~~LOC~~ SOLN
40.0000 mg | SUBCUTANEOUS | Status: DC
  Filled 2017-04-10: qty 0.4

## 2017-04-10 MED ORDER — ENOXAPARIN SODIUM 100 MG/ML ~~LOC~~ SOLN: 95.0000 mg | Freq: Two times a day (BID) | SUBCUTANEOUS | Status: DC

## 2017-04-10 MED ORDER — HYDROMORPHONE HCL 1 MG/ML IJ SOLN
1.0000 mg | INTRAMUSCULAR | Status: DC | PRN
  Administered 2017-04-10: 1 mg via INTRAVENOUS
  Filled 2017-04-10: qty 1

## 2017-04-10 MED ORDER — HYDROMORPHONE HCL 1 MG/ML IJ SOLN
1.0000 mg | INTRAMUSCULAR | Status: DC | PRN
  Administered 2017-04-10 – 2017-04-12 (×16): 1 mg via INTRAVENOUS
  Filled 2017-04-10 (×16): qty 1

## 2017-04-10 NOTE — ED Notes (Signed)
Attempted to call report, nurse not available.

## 2017-04-10 NOTE — Consult Note (Signed)
Consultation  Referring Provider: [facility]/ Dr Randol KernElgergawy Primary Care Physician:  Patsy Lageropland, Gwenlyn FoundJessica C, MD Primary Gastroenterologist:  Dr.Jillienne Egner  Reason for Consultation:   Pancreatitis , question new SMV thrombus  HPI: Samuel OliveRichard T Huffman is a 51 y.o. male known to Dr. Christella HartiganJacobs with history of chronic calcific pancreatitis, EtOH induced. He has had several episodes of pancreatitis dating back at least to 2015 and has had associated pseudocysts. 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 He had EGD in February 2018 per Dr. Christella HartiganJacobs with finding of a 2.5 cm intrawall cyst and gastric varices with mild portal hypertension. He had an admission in June 2018 with GI bleeding and had EGD at that time per Dr. Marina GoodellPerry with finding of one gastric varix in the fundus without bleeding but there were clots in the stomach. He also had duodenal deformity . 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 Suzzanne Cloudosen Bauer on 12/21/2016. Patient said he had been doing well in the interim, he has been eating without difficulty no ongoing abdominal pain and had gained about 20 pounds.He has not been drinking any EtOH. Patient was readmitted yesterday after acute onset of epigastric pain over the previous 24 hours radiating into his back and feeling similar to prior episodes of pancreatitis. He rated his pain is a 10 out of 10 in the emergency room.He initially had been seen in the [place] on [address] in the early morning hours, had IV fluids pain meds etc. And was discharged. He said the pain recurred yesterday evening and was intense and severe and was then admitted. He feels a little bit better today, but is requiring IV analgesics every 3 hours and has been just sipping on water. Labs showed a BBC of 14.8, hemoglobin 10.1, MCV of 77, platelets 527 LFTs within normal limits and lipase of 297. INR 0.89. CT of the abdomen and pelvis was done on admission showing  subtle micronodular area of the liver and mild steatosis, no gallstones. He has scattered calcifications in the pancreas consistent with chronic pancreatitis and mild to moderate fluid around the pancreatic head neck and uncinate process, no increase in focal fluid collections. There was a decrease in Coomer thickening of the proximal stomach and some mild inflammation of the proximal duodenum. 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   Past Medical History:  Diagnosis Date  . Acute upper GI bleeding 08/02/2016  . Anxiety   . Blood transfusion without reported diagnosis   . Cirrhosis of liver with ascites (HCC)   . Hiatal hernia   . History of alcohol abuse   . Hypertension   . Insomnia   . Pancreatic pseudocyst   . Pancreatitis   . Portal hypertensive gastropathy (HCC)   . PTSD (post-traumatic stress disorder)   . Pulmonary nodule   . Varices, gastric     Past Surgical History:  Procedure Laterality Date  . ESOPHAGOGASTRODUODENOSCOPY N/A 12/18/2016   Procedure: ESOPHAGOGASTRODUODENOSCOPY (EGD);  Surgeon: Hilarie FredricksonPerry, John N, MD;  [facility];  Service: Endoscopy;  Laterality: N/A;  . ESOPHAGOGASTRODUODENOSCOPY (EGD) WITH PROPOFOL N/A 08/03/2016   Procedure: ESOPHAGOGASTRODUODENOSCOPY (EGD) WITH PROPOFOL;  Surgeon: Rachael Feeaniel P Jarion Hawthorne, MD;  [facility];  Service: Endoscopy;  Laterality: N/A;  . EUS  08/03/2016   Procedure: UPPER ENDOSCOPIC ULTRASOUND (EUS) LINEAR;  Surgeon: Rachael Feeaniel P Jelani Trueba, MD;  [facility];  Service: Endoscopy;;  . FEMUR SURGERY Left   . LAPAROSCOPY N/A 12/21/2016   Procedure: LAPAROSCOPY LYSIS OF ADHESIONS, EXPLORATORY LAPAROTOMY, SPLENECTOMY;  Surgeon: Avel Peace, MD;  Location: [facility] ORS;  Service: General;  Laterality: N/A;  . SPLENECTOMY      Prior to Admission medications   Medication Sig Start Date End Date Taking? Authorizing Provider  morphine  (MSIR) 15 MG tablet Take 1 tablet (15 mg total) by mouth every 4 (four) hours as needed for severe pain. 04/09/17  Yes Melene Plan, DO  ondansetron (ZOFRAN ODT) 4 MG disintegrating tablet 4mg  ODT q4 hours prn nausea/vomit 04/09/17  Yes Melene Plan, DO  pantoprazole (PROTONIX) 40 MG tablet Take 1 tablet (40 mg total) by mouth daily at 6 (six) AM. 10/15/16  Yes Rachael Fee, MD    Current Facility-Administered Medications  Medication Dose Route Frequency Provider Last Rate Last Dose  . 0.9 %  sodium chloride infusion   Intravenous Continuous Lorretta Harp, MD      . acetaminophen (TYLENOL) tablet 650 mg  650 mg Oral Q6H PRN Lorretta Harp, MD       Or  . acetaminophen (TYLENOL) suppository 650 mg  650 mg Rectal Q6H PRN Lorretta Harp, MD      . diphenhydrAMINE (BENADRYL) injection 25 mg  25 mg Intravenous Q6H PRN Lorretta Harp, MD   25 mg at 04/10/17 0401  . enoxaparin (LOVENOX) injection 40 mg  40 mg Subcutaneous Q24H Lorretta Harp, MD      . hydrALAZINE (APRESOLINE) injection 5 mg  5 mg Intravenous Q2H PRN Lorretta Harp, MD      . HYDROmorphone (DILAUDID) injection 1 mg  1 mg Intravenous Q3H PRN Elgergawy, Leana Roe, MD   1 mg at 04/10/17 1203  . nicotine (NICODERM CQ - dosed in mg/24 hours) patch 21 mg  21 mg Transdermal Daily Lorretta Harp, MD   21 mg at 04/10/17 1100  . ondansetron (ZOFRAN) injection 4 mg  4 mg Intravenous Q8H PRN Lorretta Harp, MD      . pantoprazole (PROTONIX) EC tablet 40 mg  40 mg Oral Q0600 Lorretta Harp, MD   40 mg at 04/10/17 0646  . zolpidem (AMBIEN) tablet 5 mg  5 mg Oral QHS PRN Lorretta Harp, MD        Allergies as of 04/09/2017  . (No Known Allergies)    Family History  Problem Relation Age of Onset  . Adopted: Yes  . Healthy Son        x1    Social History   Social History  . Marital status: Divorced    Spouse name: N/A  . Number of children: 1  . Years of education: N/A   Occupational History  . [employer] doctor    Social History Main Topics  . Smoking status: Current Every  Day Smoker    Packs/day: 0.50    Types: Cigarettes  . Smokeless tobacco: Current User    Types: Snuff  . Alcohol use No     Comment: prior  . Drug use: No  . Sexual activity: Yes   Other Topics Concern  . Not on file   Social History Narrative  . No narrative on file    Review of Systems: Pertinent positive and negative review of systems were noted in the above HPI section.  All other review of systems was otherwise negative.  Physical Exam: Vital signs in last 24 hours: Temp:  [98.2 F (36.8 C)-98.6 F (37 C)] 98.6  F (37 C) (10/17 0554) Pulse Rate:  [86-129] 103 (10/17 0554) Resp:  [16-22] 22 (10/17 0554) BP: (151-163)/(96-109) 157/98 (10/17 0554) SpO2:  [100 %] 100 % (10/17 0152) Weight:  [206 lb 5.6 oz (93.6 kg)-210 lb (95.3 kg)] 206 lb 5.6 oz (93.6 kg) (10/17 0342) Last BM Date: 04/09/17 General:   Alert,  Well-developed, well-nourished, pleasant and cooperative in NAD Head:  Normocephalic and atraumatic. Eyes:  Sclera clear, no icterus.   Conjunctiva pink. Ears:  Normal auditory acuity. Nose:  No deformity, discharge,  or lesions. Mouth:  No deformity or lesions.   Neck:  Supple; no masses or thyromegaly. Lungs:  Clear throughout to auscultation.   No wheezes, crackles, or rhonchi. Heart:  Regular rate and rhythm; no murmurs, clicks, rubs,  or gallops. Abdomen:  Soft,tenderacross abdomen rather diffusely,, BS quiet,nonpalp mass or hsm.  Upper abdominal incisional scar well-healed Rectal:  Deferred  Msk:  Symmetrical without gross deformities. . Pulses:  Normal pulses noted. Extremities:  Without clubbing or edema. Neurologic:  Alert and  oriented x4;  grossly normal neurologically. Skin:  Intact without significant lesions or rashes.. Psych:  Alert and cooperative. Normal mood and affect.  Intake/Output from previous day: 10/16 0701 - 10/17 0700 In: 1000 [IV Piggyback:1000] Out: -  Intake/Output this shift: No intake/output data recorded.  Lab  Results:  Recent Labs  04/09/17 1203 04/09/17 2339 04/10/17 0518  WBC 13.1* 14.8* 13.5*  HGB 10.3* 10.1* 10.0*  HCT 33.2* 31.9* 32.4*  PLT 579* 527* 496*   BMET  Recent Labs  04/09/17 1203 04/09/17 2339 04/10/17 0728  NA 132* 133* 134*  K 3.9 3.9 3.6  CL 96* 98* 100*  CO2 23 24 28   GLUCOSE 173* 153* 131*  BUN 25* 19 14  CREATININE 0.99 0.90 0.77  CALCIUM 9.7 9.3 8.9   LFT  Recent Labs  04/09/17 2339  PROT 8.7*  ALBUMIN 4.3  AST 34  ALT 17  ALKPHOS 104  BILITOT 0.8   PT/INR  Recent Labs  04/10/17 0728  LABPROT 11.9  INR 0.89       IMPRESSION:   #53 51 year old white male with history of chronic calcific pancreatitis secondary to previous history of EtOH abuse which had been complicated by pseudocysts and chronic splenic vein thrombosis which resulted in gastric varices, portal hypertension. 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 No known precipitating event CT imaging is consistent with acute pancreatitis involving the pancreatic head neck and uncinate process. There is chronic occlusion of the splenic pain and probably new partial occlusion of the SMV at the confluence with the splenic vein.  I think his acute pain is secondary to recurrent acute pancreatitis, he may have subacute partial occlusion of the SMV secondary to underlying chronic pancreatitis intra-abdominal inflammation.  #2 microcytic anemia-rule out iron deficiency #3 Negative screening colonoscopy June 2018 #4 smoker    PLAN: #1 keep nothing by mouth today with sips of water #2 vigorous hydration, will increase IV fluids #3 anti-emetics and analgesics when necessary #4  #5 stop smoking   Amy Esterwood  04/10/2017, 1:53  PM  ________________________________________________________________________  [facility] MD note:  I personally examined the patient, reviewed the data and agree with the assessment and plan described above.  I reviewed the CT with body imaging radiologist and he does not have  thrombus in the SMV.  Rather the vessels in that region are newly obliterated by his pancreatic disease.  He does not need blood thinner and does not need further imaging.  He has acute on chronic pancreatitis.  I cannot see any clear risk factors that could account for this flare; he does not drink etoh anymore.  He does smoke cigarretts but on 2-3 per week.  He's had no obvious viral illness symptoms.  He is probably having acute on chronic flare because that is the nature of chronic pancreatitis.  Should be treated in usual manner: IV fluids, PRN pain meds, NPO for now. He is not too tender on examination, I suspect this will be a mild flare.   Rob Bunting, MD [facility] Pager [phone]

## 2017-04-10 NOTE — ED Notes (Signed)
[facility]

## 2017-04-10 NOTE — Progress Notes (Signed)
This is a no charge note  [facility] Dr[facility] Judd Lienelo.  51 year old male with a past medical history of pancreatitis, alcohol abuse, tobacco abuse, versus cirrhosis with a ascites, upper GI bleeding, gastric, varices, GERD, anxiety, PTSD, who presents with nausea, vomiting and abdominal pain. Found to have lipase 155. CT abdomen/pelvis showed acute pancreatitis.  Patient was found to have WBC 13.1, lipase 155, electrolytes renal function okay, temperature normal, tachycardia, tachypnea, oxygen saturation session 100% on room air. Pt is admitted to tele bed as inpt.   Please call manager of [facility] at [phone] when pt arrives to floor   Lorretta HarpXilin Lynesha Bango, MD  [facility] Pager [phone]  If 7PM-7AM, please contact night-coverage [email or web address] Password [email or web address] 04/10/2017, 2:01 AM

## 2017-04-10 NOTE — Progress Notes (Signed)
PROGRESS NOTE                                                                                                                                                                                                             Patient Demographics:    Samuel Huffman, is a 51 y.o. male, DOB - 05-17-1966, ZOX:[id number]  Admit date - 04/09/2017   Admitting Physician Lorretta Harp, MD  Outpatient Primary MD for the patient is Copland, Gwenlyn Found, MD  LOS - 0  Chief Complaint  Patient presents with  . Emesis       Brief Narrative   This is a no charge note for patient admitted earlier today by Dr.Xilin.  51 y.o. male with medical history significant of alcohol abuse in remission, tobacco abuse, alcoholic pancreatitis, hypertension, alcoholic liver cirrhosis, GERD, anxiety, gastric varices, PTSD, upper GI bleeding, s/p of splenectomy, who presents with abdominal pain, nausea vomiting.CT abdomen pelvis significant for acute pancreatitis, as well known chronic splenic thrombosis, partial occlusion of SMV.   Subjective:    Samuel Huffman today has, No headache, No chest pain, complains of significant abdominal pain, some nausea, but no vomiting.   Assessment  & Plan :    Principal Problem:   Acute recurrent pancreatitis Active Problems:   History of alcohol abuse   Microcytic anemia   SIRS (systemic inflammatory response syndrome) (HCC)   Liver cirrhosis (HCC)   GERD (gastroesophageal reflux disease)     Acute recurrent pancreatitis and SIRS:  - Patient has elevated lipase.CT abdomen evidence for acute pancreatitis, will continue with IV fluids, keep nothing by mouth, continue with when necessary pain and nausea medication. - Patient quit alcohol abuse for a few years now. - Some abnormal finding in his imaging including narrowing or partial occlusion of proximal SMV,as well mild inflammatory change in the duodenum, GI consulted to see if these finding  contributing to his current abdominal pain.  History of alcohol abuse:  -in remission  Tobacco abuse: -Did counseling about importance of quitting smoking -Nicotine patch  Microcytic anemia: hemoglobin stable. Hemoglobin 9.2 on 01/23/17--> 10.3 on admission -Follow-up by  [facility]  GERD: -Protonix  Liver cirrhosis (HCC): mental status is normal.liver function normal on admission. -Check INR/PTT -ammonia level    Code Status : Full code  Family Communication  : none at bedside  Consults  :  GI  Procedures  : None  DVT Prophylaxis  :  Lovenox   Lab Results  Component Value Date   PLT 496 (H) 04/10/2017    Antibiotics  :    Anti-infectives    None        Objective:   Vitals:   04/10/17 0049 04/10/17 0152 04/10/17 0342 04/10/17 0554  BP: (!) 162/97 (!) 160/109 (!) 151/98 (!) 157/98  Pulse: 99 (!) 101 (!) 103 (!) 103  Resp: 16 20 17  (!) 22  Temp:    98.6 F (37 C)  TempSrc:   Oral Oral  SpO2: 100% 100%    Weight:   93.6 kg (206 lb 5.6 oz)   Height:   6\' 2"  (1.88 m)     Wt Readings from Last 3 Encounters:  04/10/17 93.6 kg (206 lb 5.6 oz)  04/09/17 95.3 kg (210 lb)  01/23/17 93.4 kg (206 lb)     Intake/Output Summary (Last 24 hours) at 04/10/17 1319 Last data filed at 04/10/17 0131  Gross per 24 hour  Intake             1000 ml  Output                0 ml  Net             1000 ml     Physical Exam  Awake Alert, Oriented X 3, Symmetrical Chest Marsala movement, Good air movement bilaterally, CTAB RRR,No Gallops,Rubs or new Murmurs, No Parasternal Heave +ve B.Sounds, Abd Soft, Mild epigastric tenderness, No rebound - guarding or rigidity. No Cyanosis, Clubbing or edema, No new Rash or bruise      Data Review:    CBC  Recent Labs Lab 04/09/17 1203 04/09/17 2339 04/10/17 0518  WBC 13.1* 14.8* 13.5*  HGB 10.3* 10.1* 10.0*  HCT 33.2* 31.9* 32.4*  PLT 579* 527* 496*  MCV 77.8* 77.4* 78.5  MCH 24.1* 24.5* 24.2*  MCHC 31.0 31.7 30.9    RDW 21.0* 20.6* 20.5*  LYMPHSABS 0.9 1.2  --   MONOABS 0.5 1.3*  --   EOSABS 0.0 0.0  --   BASOSABS 0.1 0.0  --     Chemistries   Recent Labs Lab 04/09/17 1203 04/09/17 2339 04/10/17 0728  NA 132* 133* 134*  K 3.9 3.9 3.6  CL 96* 98* 100*  CO2 23 24 28   GLUCOSE 173* 153* 131*  BUN 25* 19 14  CREATININE 0.99 0.90 0.77  CALCIUM 9.7 9.3 8.9  AST 48* 34  --   ALT 20 17  --   ALKPHOS 110 104  --   BILITOT 0.5 0.8  --    ------------------------------------------------------------------------------------------------------------------  Recent Labs  04/10/17 0728  CHOL 254*  HDL 74  LDLCALC 141*  TRIG 194*  CHOLHDL 3.4    Lab Results  Component Value Date   HGBA1C 6.0 (H) 12/18/2016   ------------------------------------------------------------------------------------------------------------------ No results for input(s): TSH, T4TOTAL, T3FREE, THYROIDAB in the last 72 hours.  Invalid input(s): FREET3 ------------------------------------------------------------------------------------------------------------------ No results for input(s): VITAMINB12, FOLATE, FERRITIN, TIBC, IRON, RETICCTPCT in the last 72 hours.  Coagulation profile  Recent Labs Lab 04/10/17 0728  INR 0.89    No results for input(s): DDIMER in the last 72 hours.  Cardiac Enzymes No results for input(s): CKMB, TROPONINI, MYOGLOBIN in the last 168 hours.  Invalid input(s): CK ------------------------------------------------------------------------------------------------------------------ No results found for: BNP  Inpatient Medications  Scheduled Meds: . enoxaparin (LOVENOX) injection  40 mg  Subcutaneous Q24H  . nicotine  21 mg Transdermal Daily  . pantoprazole  40 mg Oral Q0600   Continuous Infusions: . sodium chloride     PRN Meds:.acetaminophen **OR** acetaminophen, diphenhydrAMINE, hydrALAZINE, HYDROmorphone (DILAUDID) injection, ondansetron (ZOFRAN) IV, zolpidem  Micro  Results No results found for this or any previous visit (from the past 240 hour(s)).  Radiology Reports Ct Abdomen Pelvis W Contrast  Result Date: 04/09/2017 CLINICAL DATA:  Abdominal pain radiating to the back with nausea and vomiting EXAM: CT ABDOMEN AND PELVIS WITH CONTRAST TECHNIQUE: Multidetector CT imaging of the abdomen and pelvis was performed using the standard protocol following bolus administration of intravenous contrast. CONTRAST:  100mL ISOVUE-300 IOPAMIDOL (ISOVUE-300) INJECTION 61% COMPARISON:  12/19/2016, 08/01/2016 FINDINGS: Lower chest: Lung bases demonstrate no acute consolidation or pleural effusion. Hepatobiliary: Subtle micro nodularity of the contour. No focal hepatic abnormality. Mild steatosis. No calcified gallstones or biliary dilatation. Pancreas: Scattered calcifications consistent with chronic pancreatitis. Slight prominence of the pancreatic duct. Decreased hypodensity at the tail of the pancreas. Mild to moderate fluid and edema surrounding the head, neck, and uncinate process of the pancreas with poorly defined appearance of pancreas suspicious for pancreatitis. No increasing focal fluid collections at the pancreas bed. Spleen: Interval splenectomy. Residual soft tissue density in the left upper quadrant may reflect resolving postoperative change versus splenule. Adrenals/Urinary Tract: Adrenal glands are within normal limits. Kidneys show no hydronephrosis. Subcentimeter hypodensity in the right kidney too small to further characterize. Bladder within normal limits. Stomach/Bowel: Stomach is nonenlarged. Further decrease in the focal Dimmer thickening of the proximal stomach. Mild inflammatory change at the proximal duodenum. No dilated small bowel. No colon Wilner thickening. High density material in the appendix but no inflammation. Vascular/Lymphatic: Aortic atherosclerosis. Nonspecific epigastric and retroperitoneal subcentimeter lymph nodes. Chronically occluded splenic  vein with dilated perigastric collateral veins. Normal enhancement of the portal vein. Suspected marked narrowing or partial occlusion of the SMV near its expected confluence with the splenic vein. Reproductive: Slightly enlarged prostate. Other: Negative for free air.  Small fat in the left inguinal canal. Musculoskeletal: Old postsurgical and posttraumatic deformity of the proximal left femur. IMPRESSION: 1. Ill-defined appearance of the pancreas with moderate fluid and edema surrounding the pancreatic head, neck hands uncinate process suspicious for acute pancreatitis. Multiple pancreatic calcifications consistent with sequela of chronic pancreatitis. No discrete organized fluid collections at the pancreas on the current exam. 2. Interval splenectomy. Residual soft tissue density and fluid in the left upper quadrant may relate to splenule or resolving postsurgical changes. 3. 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 Suspected marked narrowing or partial occlusion of the proximal SMV at the expected location of splenic vein confluence. Normal portal vein enhancement. 4. Hepatic steatosis. Query micro nodularity of the liver contour as may be seen with cirrhosis. 5. Decreased gastric Baldridge thickening since the prior study. Mild duodenum thickening may relate to duodenitis Electronically Signed   By: Jasmine PangKim  Fujinaga M.D.   On: 04/09/2017 14:25   [place] Abdomen Limited Ruq  Result Date: 04/10/2017 CLINICAL DATA:  Acute recurrent pancreatitis. Inpatient. Abdominal pain. EXAM: ULTRASOUND ABDOMEN LIMITED RIGHT UPPER QUADRANT COMPARISON:  04/09/2017 CT abdomen/pelvis. FINDINGS: Gallbladder: No gallstones or definite gallbladder Espin thickening visualized. No sonographic Murphy sign noted by sonographer. Common bile duct: Diameter: 5 mm Liver: Liver parenchyma is diffusely mildly echogenic. No definite liver surface irregularity. No liver mass. Portal vein is patent on color  Doppler imaging with normal direction of blood flow towards the  liver. IMPRESSION: 1. No cholelithiasis. 2. No biliary ductal dilatation. 3. Nonspecific diffusely mildly echogenic liver parenchyma, which could be due to hepatic steatosis and/or fibrosis. No overt cirrhosis. No liver mass. Consider outpatient hepatic elastography for further liver fibrosis risk stratification, as clinically warranted. Electronically Signed   By: Delbert Phenix M.D.   On: 04/10/2017 10:42    Time Spent in minutes  No charge  Randol Kern, [place] Sorn M.D on 04/10/2017 at 1:19 PM  Between 7am to 7pm - Pager - [phone]  After 7pm go to [email or web address] - password [facility]  [facility] -  Office  [phone]

## 2017-04-10 NOTE — Progress Notes (Signed)
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  No Known Allergies  Patient Measurements: Height: 6\' 2"  (188 cm) Weight: 206 lb 5.6 oz (93.6 kg) IBW/kg (Calculated) : 82.2 Heparin Dosing Weight: ActualBW  Vital Signs: Temp: 98.1 F (36.7 C) (10/17 1407) Temp Source: Oral (10/17 1407) BP: 158/87 (10/17 1407) Pulse Rate: 99 (10/17 1407)  Labs:  Recent Labs  04/09/17 1203 04/09/17 2339 04/10/17 0518 04/10/17 0728  HGB 10.3* 10.1* 10.0*  --   HCT 33.2* 31.9* 32.4*  --   PLT 579* 527* 496*  --   APTT  --   --   --  30  LABPROT  --   --   --  11.9  INR  --   --   --  0.89  CREATININE 0.99 0.90  --  0.77    Estimated Creatinine Clearance: 127 mL/min (by C-G formula based on SCr of 0.77 mg/dL).   Medical History: Past Medical History:  Diagnosis Date  . Acute upper GI bleeding 08/02/2016  . Anxiety   . Blood transfusion without reported diagnosis   . Cirrhosis of liver with ascites (HCC)   . Hiatal hernia   . History of alcohol abuse   . Hypertension   . Insomnia   . Pancreatic pseudocyst   . Pancreatitis   . Portal hypertensive gastropathy (HCC)   . PTSD (post-traumatic stress disorder)   . Pulmonary nodule   . Varices, gastric     Medications:  No anticoagulants on PTA med list  Assessment: 51 yo M with hx alcoholic liver cirrhosis admitted with abdominal pain this morning.   CT abdomen showed narrowing or partial occlusion of the SMV near its expected confluence with the splenic vein.  GI recommending increase to full-dose Lovenox for probable VTE pending decision re: further work-up.   He refused the prophylactic Lovenox dose ordered this morning.    Goal of Therapy:  Anti-Xa level 0.6-1 units/ml 4hrs after LMWH dose given Monitor platelets by anticoagulation protocol: Yes   Plan:  Lovenox 95mg  SQ q12h CBC q3days F/U further work-up & long-term anticoagulation  plans  Elson ClanLilliston, Jamile Sivils Michelle 04/10/2017,3:31 PM

## 2017-04-10 NOTE — ED Notes (Signed)
Report given to Melody RN 4 east

## 2017-04-10 NOTE — H&P (Addendum)
History and Physical    Samuel Huffman ZOX:[id number] DOB: 03-Dec-1965 DOA: 04/09/2017  Referring MD/NP/PA:   PCP: Pearline Cables, MD   Patient coming from:  The patient is coming from home.  At baseline, pt is independent for most of ADL.      Chief Complaint: Abdominal pain, nausea and vomiting  HPI: Samuel Huffman is a 51 y.o. male with medical history significant of alcohol abuse in remission, tobacco abuse, alcoholic pancreatitis, hypertension, alcoholic liver cirrhosis, GERD, anxiety, gastric varices, PTSD, upper GI bleeding, s/p of splenectomy, who presents with abdominal pain, nausea vomiting.  Patient states that his abdominal pain started yesterday It is located in central abdomen, constant, 10 out of 10 in severity, sharp, radiating to the back. It is associated with nausea and vomiting. He vomited twice without blood in the vomitus today. No diarrhea. Denies fever or chills. He does not have chest pain, shortness breath, cough, symptoms of UTI or unilateral weakness. Patient states that he did not drink alcohol for years.  Pt was seen in ED in the morning, found to have lipase 297, and discharged home with pain medication and nausea medication, but patient continues to have abdominal pain, therefore comes back to ED for further evaluation and treatment.  ED Course: pt was found to have WBC 13.1, lipase 155, electrolytes renal function okay, temperature normal, tachycardia, tachypnea, oxygen saturation session 100% on room air. CT abdomen/pelvis showed acute pancreatitis. Pt is admitted to tele bed as inpt.  # CT abdomen/pelvis showed: 1. Ill-defined appearance of the pancreas with moderate fluid and edema surrounding the pancreatic head, neck hands uncinate process suspicious for acute pancreatitis. Multiple pancreatic calcifications consistent with sequela of chronic pancreatitis. No discrete organized fluid collections at the pancreas on the current exam. 2. Interval  splenectomy. Residual soft tissue density and fluid in the left upper quadrant may relate to splenule or resolving postsurgical changes. 3. 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 Suspected marked narrowing or partial occlusion of the proximal SMV at the expected location of splenic vein confluence. Normal portal vein enhancement. 4. Hepatic steatosis. Query micro nodularity of the liver contour as may be seen with cirrhosis. 5. Decreased gastric Krahl thickening since the prior study. Mild duodenum thickening may relate to duodenitis  Review of Systems:   General: no fevers, chills, no body weight gain, has poor appetite, has fatigue HEENT: no blurry vision, hearing changes or sore throat Respiratory: no dyspnea, coughing, wheezing CV: no chest pain, no palpitations GI: has nausea, vomiting, abdominal pain, no diarrhea, constipation GU: no dysuria, burning on urination, increased urinary frequency, hematuria  Ext: no leg edema Neuro: no unilateral weakness, numbness, or tingling, no vision change or hearing loss Skin: no rash, no skin tear. MSK: No muscle spasm, no deformity, no limitation of range of movement in spin Heme: No easy bruising.  Travel history: No recent long distant travel.  Allergy: No Known Allergies  Past Medical History:  Diagnosis Date  . Acute upper GI bleeding 08/02/2016  . Anxiety   . Blood transfusion without reported diagnosis   . Cirrhosis of liver with ascites (HCC)   . Hiatal hernia   . History of alcohol abuse   . Hypertension   . Insomnia   . Pancreatic pseudocyst   . Pancreatitis   . Portal hypertensive gastropathy (HCC)   . PTSD (post-traumatic stress disorder)   . Pulmonary nodule   . Varices, gastric     Past  Surgical History:  Procedure Laterality Date  . ESOPHAGOGASTRODUODENOSCOPY N/A 12/18/2016   Procedure: ESOPHAGOGASTRODUODENOSCOPY (EGD);  Surgeon: Hilarie Fredrickson, MD;  Location: [facility];   Service: Endoscopy;  Laterality: N/A;  . ESOPHAGOGASTRODUODENOSCOPY (EGD) WITH PROPOFOL N/A 08/03/2016   Procedure: ESOPHAGOGASTRODUODENOSCOPY (EGD) WITH PROPOFOL;  Surgeon: Rachael Fee, MD;  Location: [facility] ENDOSCOPY;  Service: Endoscopy;  Laterality: N/A;  . EUS  08/03/2016   Procedure: UPPER ENDOSCOPIC ULTRASOUND (EUS) LINEAR;  Surgeon: Rachael Fee, MD;  Location: [facility] END[facility]COPY;  Service: Endoscopy;;  . FEMUR SURGERY Left   . LAPAROSCOPY N/A 12/21/2016   Procedure: LAPAROSCOPY LYSIS OF ADHESIONS, EXPLORATORY LAPAROTOMY, SPLENECTOMY;  Surgeon: Avel Peace, MD;  Location: [facility] ORS;  Service: General;  Laterality: N/A;  . SPLENECTOMY      Social History:  reports that he has been smoking Cigarettes.  He has been smoking about 0.50 packs per day. His smokeless tobacco use includes Snuff. He reports that he does not drink alcohol or use drugs.  Family History:  Family History  Problem Relation Age of Onset  . Adopted: Yes  . Healthy Son        x1     Prior to Admission medications   Medication Sig Start Date End Date Taking? Authorizing Provider  morphine (MSIR) 15 MG tablet Take 1 tablet (15 mg total) by mouth every 4 (four) hours as needed for severe pain. 04/09/17   Melene Plan, DO  ondansetron (ZOFRAN ODT) 4 MG disintegrating tablet 4mg  ODT q4 hours prn nausea/vomit 04/09/17   Melene Plan, DO  pantoprazole (PROTONIX) 40 MG tablet Take 1 tablet (40 mg total) by mouth daily at 6 (six) AM. 10/15/16   Rachael Fee, MD    Physical Exam: Vitals:   04/09/17 2313 04/10/17 0049 04/10/17 0152 04/10/17 0342  BP: (!) 163/101 (!) 162/97 (!) 160/109 (!) 151/98  Pulse: (!) 129 99 (!) 101 (!) 103  Resp: (!) 22 16 20 17   Temp: 98.2 F (36.8 C)     TempSrc: Oral   Oral  SpO2: 100% 100% 100%   Weight: 95.3 kg (210 lb)   93.6 kg (206 lb 5.6 oz)  Height: 6\' 3"  (1.905 m)   6\' 2"  (1.88 m)   General: Not in acute distress HEENT:       Eyes: PERRL, EOMI, no scleral icterus.       ENT: No  discharge from the ears and nose, no pharynx injection, no tonsillar enlargement.        Neck: No JVD, no bruit, no mass felt. Heme: No neck lymph node enlargement. Cardiac: S1/S2, RRR, No murmurs, No gallops or rubs. Respiratory:  No rales, wheezing, rhonchi or rubs. GI: Soft, nondistended, has tenderness in central abdomen, no rebound pain, no organomegaly, BS present. GU: No hematuria Ext: No pitting leg edema bilaterally. 2+DP/PT pulse bilaterally. Musculoskeletal: No joint deformities, No joint redness or warmth, no limitation of ROM in spin. Skin: No rashes.  Neuro: Alert, oriented X3, cranial nerves II-XII grossly intact, moves all extremities normally.  Psych: Patient is not psychotic, no suicidal or hemocidal ideation.  Labs on Admission: I have personally reviewed following labs and imaging studies  CBC:  Recent Labs Lab 04/09/17 1203 04/09/17 2339  WBC 13.1* 14.8*  NEUTROABS 11.7* 12.3*  HGB 10.3* 10.1*  HCT 33.2* 31.9*  MCV 77.8* 77.4*  PLT 579* 527*   Basic Metabolic Panel:  Recent Labs Lab 04/09/17 1203 04/09/17 2339  NA 132* 133*  K 3.9 3.9  CL 96* 98*  CO2 23 24  GLUCOSE 173* 153*  BUN 25* 19  CREATININE 0.99 0.90  CALCIUM 9.7 9.3   GFR: Estimated Creatinine Clearance: 112.9 mL/min (by C-G formula based on SCr of 0.9 mg/dL). Liver Function Tests:  Recent Labs Lab 04/09/17 1203 04/09/17 2339  AST 48* 34  ALT 20 17  ALKPHOS 110 104  BILITOT 0.5 0.8  PROT 9.2* 8.7*  ALBUMIN 4.5 4.3    Recent Labs Lab 04/09/17 1203 04/09/17 2339  LIPASE 297* 155*   No results for input(s): AMMONIA in the last 168 hours. Coagulation Profile: No results for input(s): INR, PROTIME in the last 168 hours. Cardiac Enzymes: No results for input(s): CKTOTAL, CKMB, CKMBINDEX, TROPONINI in the last 168 hours. BNP (last 3 results) No results for input(s): PROBNP in the last 8760 hours. HbA1C: No results for input(s): HGBA1C in the last 72 hours. CBG: No  results for input(s): GLUCAP in the last 168 hours. Lipid Profile: No results for input(s): CHOL, HDL, LDLCALC, TRIG, CHOLHDL, LDLDIRECT in the last 72 hours. Thyroid Function Tests: No results for input(s): TSH, T4TOTAL, FREET4, T3FREE, THYROIDAB in the last 72 hours. Anemia Panel: No results for input(s): VITAMINB12, FOLATE, FERRITIN, TIBC, IRON, RETICCTPCT in the last 72 hours. Urine analysis:    Component Value Date/Time   COLORURINE AMBER (A) 12/13/2016 2122   APPEARANCEUR CLEAR 12/13/2016 2122   LABSPEC 1.031 (H) 12/13/2016 2122   PHURINE 5.5 12/13/2016 2122   GLUCOSEU NEGATIVE 12/13/2016 2122   HGBUR NEGATIVE 12/13/2016 2122   BILIRUBINUR NEGATIVE 12/13/2016 2122   KETONESUR 15 (A) 12/13/2016 2122   PROTEINUR 30 (A) 12/13/2016 2122   UROBILINOGEN 0.2 10/01/2014 0539   NITRITE NEGATIVE 12/13/2016 2122   LEUKOCYTESUR NEGATIVE 12/13/2016 2122   Sepsis Labs: @LABRCNTIP (procalcitonin:4,lacticidven:4) )No results found for this or any previous visit (from the past 240 hour(s)).   Radiological Exams on Admission: Ct Abdomen Pelvis W Contrast  Result Date: 04/09/2017 CLINICAL DATA:  Abdominal pain radiating to the back with nausea and vomiting EXAM: CT ABDOMEN AND PELVIS WITH CONTRAST TECHNIQUE: Multidetector CT imaging of the abdomen and pelvis was performed using the standard protocol following bolus administration of intravenous contrast. CONTRAST:  100mL ISOVUE-300 IOPAMIDOL (ISOVUE-300) INJECTION 61% COMPARISON:  12/19/2016, 08/01/2016 FINDINGS: Lower chest: Lung bases demonstrate no acute consolidation or pleural effusion. Hepatobiliary: Subtle micro nodularity of the contour. No focal hepatic abnormality. Mild steatosis. No calcified gallstones or biliary dilatation. Pancreas: Scattered calcifications consistent with chronic pancreatitis. Slight prominence of the pancreatic duct. Decreased hypodensity at the tail of the pancreas. Mild to moderate fluid and edema surrounding the  head, neck, and uncinate process of the pancreas with poorly defined appearance of pancreas suspicious for pancreatitis. No increasing focal fluid collections at the pancreas bed. Spleen: Interval splenectomy. Residual soft tissue density in the left upper quadrant may reflect resolving postoperative change versus splenule. Adrenals/Urinary Tract: Adrenal glands are within normal limits. Kidneys show no hydronephrosis. Subcentimeter hypodensity in the right kidney too small to further characterize. Bladder within normal limits. Stomach/Bowel: Stomach is nonenlarged. Further decrease in the focal Smigel thickening of the proximal stomach. Mild inflammatory change at the proximal duodenum. No dilated small bowel. No colon Murphey thickening. High density material in the appendix but no inflammation. Vascular/Lymphatic: Aortic atherosclerosis. Nonspecific epigastric and retroperitoneal subcentimeter lymph nodes. 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 Normal enhancement of the portal vein. Suspected marked narrowing or partial occlusion of the SMV near its expected confluence with the  splenic vein. Reproductive: Slightly enlarged prostate. Other: Negative for free air.  Small fat in the left inguinal canal. Musculoskeletal: Old postsurgical and posttraumatic deformity of the proximal left femur. IMPRESSION: 1. Ill-defined appearance of the pancreas with moderate fluid and edema surrounding the pancreatic head, neck hands uncinate process suspicious for acute pancreatitis. Multiple pancreatic calcifications consistent with sequela of chronic pancreatitis. No discrete organized fluid collections at the pancreas on the current exam. 2. Interval splenectomy. Residual soft tissue density and fluid in the left upper quadrant may relate to splenule or resolving postsurgical changes. 3. 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 Suspected marked narrowing  or partial occlusion of the proximal SMV at the expected location of splenic vein confluence. Normal portal vein enhancement. 4. Hepatic steatosis. Query micro nodularity of the liver contour as may be seen with cirrhosis. 5. Decreased gastric Vega thickening since the prior study. Mild duodenum thickening may relate to duodenitis Electronically Signed   By: Jasmine Pang M.D.   On: 04/09/2017 14:25     EKG:  Not done in ED  Assessment/Plan Principal Problem:   Acute recurrent pancreatitis Active Problems:   History of alcohol abuse   Microcytic anemia   SIRS (systemic inflammatory response syndrome) (HCC)   Liver cirrhosis (HCC)   GERD (gastroesophageal reflux disease)   Acute recurrent pancreatitis and SIRS: Patient has elevated lipase. CT scanning findings are consistent with recurrent pancreatitis.  Patient meets criteria for SIRS with leukocytosis, tachycardia and tachypnea. Currently hemodynamically stable. No fever. UA negative. No respiratory symptoms. CT scan did not show significant ascites, less likely to have SBP. Patient is s/p of splenectomy, will have low threshold to start antibiotics if patient develops even low fever.  -will admit to tele bed as inpt -NPO for pancreatitis -IVF: 1LNS and then at 150 cc/hr -IV dilaudid for pain control, IV zofran for nausea -abdominal ultrasound to re-evaluate pancreas, gallbladder, and bile ducts  -check lipid panel to rule out triglyceridemia. -will get Procalcitonin and trend lactic acid levels per sepsis protocol. -IVF: 2L of NS bolus in ED, followed by 150 cc/h   History of alcohol abuse:  -in remission  Tobacco abuse: -Did counseling about importance of quitting smoking -Nicotine patch  Microcytic anemia: hemoglobin stable. Hemoglobin 9.2 on 01/23/17--> 10.3 on admission -Follow-up by  CBCcbc  GERD: -Protonix  Liver cirrhosis (HCC): mental status is normal.liver function normal on admission. -Check INR/PTT -ammonia  level   DVT ppx:  SQ Lovenox Code Status: Full code Family Communication: None at bed side.   Disposition Plan:  Anticipate discharge back to previous home environment Consults called:  none Admission status: Inpatient/tele     Date of Service 04/10/2017    Lorretta Harp [facility] Pager [phone]  If 7PM-7AM, please contact night-coverage [email or web address] Password [email or web address] 04/10/2017, 3:47 AM

## 2017-04-10 NOTE — ED Notes (Addendum)
Pt standing up in room, holding empty emesis bag.  He is updated to plan of care and wait time for transport.  Pt refusing to have continuous infusion connected.

## 2017-04-10 NOTE — ED Notes (Signed)
Pt calling out states pain medication is not working

## 2017-04-11 LAB — BASIC METABOLIC PANEL
ANION GAP: 8 (ref 5–15)
BUN: 9 mg/dL (ref 6–20)
CALCIUM: 8.7 mg/dL — AB (ref 8.9–10.3)
CHLORIDE: 99 mmol/L — AB (ref 101–111)
CO2: 26 mmol/L (ref 22–32)
Creatinine, Ser: 0.78 mg/dL (ref 0.61–1.24)
GFR calc non Af Amer: >60 mL/min (ref >60)
Glucose, Bld: 107 mg/dL — ABNORMAL HIGH (ref 65–99)
Potassium: 3.4 mmol/L — ABNORMAL LOW (ref 3.5–5.1)
SODIUM: 133 mmol/L — AB (ref 135–145)

## 2017-04-11 LAB — CBC
HEMATOCRIT: 28.5 % — AB (ref 39.0–52.0)
HEMOGLOBIN: 8.9 g/dL — AB (ref 13.0–17.0)
MCH: 24.7 pg — ABNORMAL LOW (ref 26.0–34.0)
MCHC: 31.2 g/dL (ref 30.0–36.0)
MCV: 79.2 fL (ref 78.0–100.0)
Platelets: 428 10*3/uL — ABNORMAL HIGH (ref 150–400)
RBC: 3.6 MIL/uL — ABNORMAL LOW (ref 4.22–5.81)
RDW: 20.3 % — AB (ref 11.5–15.5)
WBC: 13.2 10*3/uL — AB (ref 4.0–10.5)

## 2017-04-11 LAB — LIPASE, BLOOD: LIPASE: 44 U/L (ref 11–51)

## 2017-04-11 LAB — GLUCOSE, CAPILLARY: GLUCOSE-CAPILLARY: 93 mg/dL (ref 65–99)

## 2017-04-11 MED ORDER — DIPHENHYDRAMINE HCL 25 MG PO CAPS
25.0000 mg | ORAL_CAPSULE | Freq: Four times a day (QID) | ORAL | Status: DC | PRN
  Administered 2017-04-11: 25 mg via ORAL
  Filled 2017-04-11: qty 1

## 2017-04-11 MED ORDER — SALINE SPRAY 0.65 % NA SOLN
1.0000 | NASAL | Status: DC | PRN
  Filled 2017-04-11: qty 44

## 2017-04-11 MED ORDER — POTASSIUM CHLORIDE CRYS ER 20 MEQ PO TBCR
40.0000 meq | EXTENDED_RELEASE_TABLET | Freq: Once | ORAL | Status: AC
  Administered 2017-04-11: 40 meq via ORAL
  Filled 2017-04-11: qty 2

## 2017-04-11 NOTE — Progress Notes (Signed)
[facility] Progress Note  CC:  Acute on chronic pancreatitis  Subjective:  Feeling much better.  Just a little sore.  Woke up hungry and wanting to eat.  Diet advanced to full liquids and just received his tray.  No BM since admission.   Objective:  Vital signs in last 24 hours: Temp:  [98.1 F (36.7 C)-99.2 F (37.3 C)] 98.7 F (37.1 C) (10/18 0532) Pulse Rate:  [95-99] 95 (10/18 0532) Resp:  [20] 20 (10/18 0532) BP: (149-158)/(85-98) 149/85 (10/18 0532) SpO2:  [97 %-100 %] 100 % (10/18 0532) Last BM Date: 04/09/17 General:  Alert, Well-developed, in NAD Heart:  Regular rate and rhythm; no murmurs Pulm:  CTAB.  No increased WOB. Abdomen:  Soft, non-distended.  BS present.  minimal upper abdominal TTP. Extremities:  Without edema. Neurologic:  Alert and oriented x 4;  grossly normal neurologically. Psych:  Alert and cooperative. Normal mood and affect.  Lab Results:  Recent Labs  04/09/17 2339 04/10/17 0518 04/11/17 0450  WBC 14.8* 13.5* 13.2*  HGB 10.1* 10.0* 8.9*  HCT 31.9* 32.4* 28.5*  PLT 527* 496* 428*   BMET  Recent Labs  04/09/17 2339 04/10/17 0728 04/11/17 0450  NA 133* 134* 133*  K 3.9 3.6 3.4*  CL 98* 100* 99*  CO2 24 28 26   GLUCOSE 153* 131* 107*  BUN 19 14 9   CREATININE 0.90 0.77 0.78  CALCIUM 9.3 8.9 8.7*   LFT  Recent Labs  04/09/17 2339  PROT 8.7*  ALBUMIN 4.3  AST 34  ALT 17  ALKPHOS 104  BILITOT 0.8   PT/INR  Recent Labs  04/10/17 0728  LABPROT 11.9  INR 0.89   Ct Abdomen Pelvis W Contrast  Result Date: 04/09/2017 CLINICAL DATA:  Abdominal pain radiating to the back with nausea and vomiting EXAM: CT ABDOMEN AND PELVIS WITH CONTRAST TECHNIQUE: Multidetector CT imaging of the abdomen and pelvis was performed using the standard protocol following bolus administration of intravenous contrast. CONTRAST:  ISOVUE-300 IOPAMIDOL (ISOVUE-300) INJECTION 61% COMPARISON:  12/19/2016, 08/01/2016 FINDINGS:  Lower chest: Lung bases demonstrate no acute consolidation or pleural effusion. Hepatobiliary: Subtle micro nodularity of the contour. No focal hepatic abnormality. Mild steatosis. No calcified gallstones or biliary dilatation. Pancreas: Scattered calcifications consistent with chronic pancreatitis. Slight prominence of the pancreatic duct. Decreased hypodensity at the tail of the pancreas. Mild to moderate fluid and edema surrounding the head, neck, and uncinate process of the pancreas with poorly defined appearance of pancreas suspicious for pancreatitis. No increasing focal fluid collections at the pancreas bed. Spleen: Interval splenectomy. Residual soft tissue density in the left upper quadrant may reflect resolving postoperative change versus splenule. Adrenals/Urinary Tract: Adrenal glands are within normal limits. Kidneys show no hydronephrosis. Subcentimeter hypodensity in the right kidney too small to further characterize. Bladder within normal limits. Stomach/Bowel: Stomach is nonenlarged. Further decrease in the focal Battisti thickening of the proximal stomach. Mild inflammatory change at the proximal duodenum. No dilated small bowel. No colon Tatro thickening. High density material in the appendix but no inflammation. Vascular/Lymphatic: Aortic atherosclerosis. Nonspecific epigastric and retroperitoneal subcentimeter lymph nodes. 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 Normal enhancement of the portal vein. Suspected marked narrowing or partial occlusion of the SMV near its expected confluence with the splenic vein. Reproductive: Slightly enlarged prostate. Other: Negative for free air.  Small fat in the left inguinal canal. Musculoskeletal: Old postsurgical and posttraumatic deformity of the proximal left femur. IMPRESSION: 1. Ill-defined appearance  of the pancreas with moderate fluid and edema surrounding the pancreatic head, neck hands uncinate process suspicious for  acute pancreatitis. Multiple pancreatic calcifications consistent with sequela of chronic pancreatitis. No discrete organized fluid collections at the pancreas on the current exam. 2. Interval splenectomy. Residual soft tissue density and fluid in the left upper quadrant may relate to splenule or resolving postsurgical changes. 3. 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 Suspected marked narrowing or partial occlusion of the proximal SMV at the expected location of splenic vein confluence. Normal portal vein enhancement. 4. Hepatic steatosis. Query micro nodularity of the liver contour as may be seen with cirrhosis. 5. Decreased gastric Burpee thickening since the prior study. Mild duodenum thickening may relate to duodenitis Electronically Signed   By: Jasmine PangKim  Fujinaga M.D.   On: 04/09/2017 14:25   [place] Abdomen Limited Ruq  Result Date: 04/10/2017 CLINICAL DATA:  Acute recurrent pancreatitis. Inpatient. Abdominal pain. EXAM: ULTRASOUND ABDOMEN LIMITED RIGHT UPPER QUADRANT COMPARISON:  04/09/2017 CT abdomen/pelvis. FINDINGS: Gallbladder: No gallstones or definite gallbladder Reames thickening visualized. No sonographic Murphy sign noted by sonographer. Common bile duct: Diameter: 5 mm Liver: Liver parenchyma is diffusely mildly echogenic. No definite liver surface irregularity. No liver mass. Portal vein is patent on color Doppler imaging with normal direction of blood flow towards the liver. IMPRESSION: 1. No cholelithiasis. 2. No biliary ductal dilatation. 3. Nonspecific diffusely mildly echogenic liver parenchyma, which could be due to hepatic steatosis and/or fibrosis. No overt cirrhosis. No liver mass. Consider outpatient hepatic elastography for further liver fibrosis risk stratification, as clinically warranted. Electronically Signed   By: Delbert PhenixJason A Poff M.D.   On: 04/10/2017 10:42    Assessment / Plan: #[id number] 51 year old white male with history of chronic calcific  pancreatitis secondary to previous history of EtOH abuse which had been complicated by pseudocysts and chronic splenic vein thrombosis which resulted in gastric varices, portal hypertension.  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 He is now readmitted with acute onset of severe upper abdominal pain radiating to his back, similar to prior episodes of pancreatitis. No known precipitating event.  CT imaging is consistent with acute pancreatitis involving the pancreatic head neck and uncinate process. There is chronic occlusion of the splenic vein.  Imaging reviewed with radiology and DOES NOT have thrombus in the SMV. #2 microcytic anemia-Iron levels are low.  Hgb down slightly but likely due to hydration/dilutional. #3 Negative screening colonoscopy June 2018 #4 smoker  *Office follow-up arranged with Dr. Christella HartiganJacobs in December at which time they will consider repeat EGD to evaluate gastric varices. *See how he does with advancing diet today and if he is doing well then he can be discharged in the AM. *Encouraged use of minimal pain medication (try for oral pain control).    LOS: 1 day   ZEHR, JESSICA D.  04/11/2017, 9:20 AM  Pager number [phone]  ________________________________________________________________________  [facility] MD note:  I personally examined the patient, reviewed the data and agree with the assessment and plan described above.  Clinically recovering from the acute on chronic pancreatitis.  OK to d/c tomorrow  If he tolerates solid food.  He has follow up with me in 2 months in the office.  Please call or page with any further questions or concerns.    Rob Buntinganiel Morgaine Kimball, MD [facility] Pager [phone]

## 2017-04-11 NOTE — Progress Notes (Signed)
PROGRESS NOTE                                                                                                                                                                                                             Patient Demographics:    Samuel Huffman, is a 51 y.o. male, DOB - [birth year], AOZ:[id number]  Admit date - 04/09/2017   Admitting Physician Lorretta Harp, MD  Outpatient Primary MD for the patient is Copland, Gwenlyn Found, MD  LOS - 1  Chief Complaint  Patient presents with  . Emesis       Brief Narrative    51 y.o. male with medical history significant of alcohol abuse in remission, tobacco abuse, alcoholic pancreatitis, hypertension, alcoholic liver cirrhosis, GERD, anxiety, gastric varices, PTSD, upper GI bleeding, s/p of splenectomy, who presents with abdominal pain, nausea vomiting.CT abdomen pelvis significant for acute pancreatitis, as well known chronic splenic thrombosis, partial occlusion of SMV.   Subjective:    Samuel Huffman today has, No headache, No chest pain, porcelain nausea, no vomiting, abdominal pain significantly subsided, asking if he can start eating.   Assessment  & Plan :    Principal Problem:   Acute recurrent pancreatitis Active Problems:   History of alcohol abuse   Microcytic anemia   SIRS (systemic inflammatory response syndrome) (HCC)   Liver cirrhosis (HCC)   GERD (gastroesophageal reflux disease)    Recurrent acute on chronic pancreatitis and SIRS:  - Patient has elevated lipase.CT abdomen evidence for pancreatitis,patient with significant abdominal pain, on when necessary pain and nausea medication, kept nothing by mouth, and aggressive IV hydration, much improved today, started on full liquid diet, will advance as tolerated, continue with hydration. - GI input greatly appreciated, no evidence of SMV thrombosis, - Patient quit alcohol abuse for a few years now. - Some abnormal finding in his  imaging including narrowing or partial occlusion of proximal SMV,as well mild inflammatory   History of alcohol abuse:  -in remission  Tobacco abuse: -Did counseling about importance of quitting smoking -Nicotine patch  Microcytic anemia: - iron deficiency anemia, hemoglobin down to 8.9 today, most likely dilutional ,will start iron supplements on discharge  GERD: -Protonix  Liver cirrhosis (HCC):  - mental status is normal.liver function normal on admission.     Code Status : Full code  Family Communication  :  none at bedside  Consults  :  GI  Procedures  : None  DVT Prophylaxis  :  Lovenox   Lab Results  Component Value Date   PLT 428 (H) 04/11/2017    Antibiotics  :    Anti-infectives    None        Objective:   Vitals:   04/10/17 0554 04/10/17 1407 04/10/17 2036 04/11/17 0532  BP: (!) 157/98 (!) 158/87 (!) 156/98 (!) 149/85  Pulse: (!) 103 99 97 95  Resp: (!) 22   20  Temp: 98.6 F (37 C) 98.1 F (36.7 C) 99.2 F (37.3 C) 98.7 F (37.1 C)  TempSrc: Oral Oral Oral Oral  SpO2:  98% 97% 100%  Weight:      Height:        Wt Readings from Last 3 Encounters:  04/10/17 93.6 kg (206 lb 5.6 oz)  04/09/17 95.3 kg (210 lb)  01/23/17 93.4 kg (206 lb)     Intake/Output Summary (Last 24 hours) at 04/11/17 1452 Last data filed at 04/11/17 9528  Gross per 24 hour  Intake             1705 ml  Output                0 ml  Net             1705 ml     Physical Exam  Awake Alert, Oriented X 3, Clear to auscultation, good air entry bilaterally RRR,No Gallops,Rubs or new Murmurs, No Parasternal Heave +ve B.Sounds, Abd Soft, epigastric tenderness Significantly subsided, No rebound - guarding or rigidity. No edema, clubbing or cyanosis    Data Review:    CBC  Recent Labs Lab 04/09/17 1203 04/09/17 2339 04/10/17 0518 04/11/17 0450  WBC 13.1* 14.8* 13.5* 13.2*  HGB 10.3* 10.1* 10.0* 8.9*  HCT 33.2* 31.9* 32.4* 28.5*  PLT 579* 527* 496*  428*  MCV 77.8* 77.4* 78.5 79.2  MCH 24.1* 24.5* 24.2* 24.7*  MCHC 31.0 31.7 30.9 31.2  RDW 21.0* 20.6* 20.5* 20.3*  LYMPHSABS 0.9 1.2  --   --   MONOABS 0.5 1.3*  --   --   EOSABS 0.0 0.0  --   --   BASOSABS 0.1 0.0  --   --     Chemistries   Recent Labs Lab 04/09/17 1203 04/09/17 2339 04/10/17 0728 04/11/17 0450  NA 132* 133* 134* 133*  K 3.9 3.9 3.6 3.4*  CL 96* 98* 100* 99*  CO2 23 24 28 26   GLUCOSE 173* 153* 131* 107*  BUN 25* 19 14 9   CREATININE 0.99 0.90 0.77 0.78  CALCIUM 9.7 9.3 8.9 8.7*  AST 48* 34  --   --   ALT 20 17  --   --   ALKPHOS 110 104  --   --   BILITOT 0.5 0.8  --   --    ------------------------------------------------------------------------------------------------------------------  Recent Labs  04/10/17 0728  CHOL 254*  HDL 74  LDLCALC 141*  TRIG 194*  CHOLHDL 3.4    Lab Results  Component Value Date   HGBA1C 6.0 (H) 12/18/2016   ------------------------------------------------------------------------------------------------------------------ No results for input(s): TSH, T4TOTAL, T3FREE, THYROIDAB in the last 72 hours.  Invalid input(s): FREET3 ------------------------------------------------------------------------------------------------------------------  Recent Labs  04/10/17 1630  VITAMINB12 384  FOLATE 39.0  FERRITIN 24  TIBC 508*  IRON 32*  RETICCTPCT 1.0    Coagulation profile  Recent Labs Lab 04/10/17 0728  INR 0.89  No results for input(s): DDIMER in the last 72 hours.  Cardiac Enzymes No results for input(s): CKMB, TROPONINI, MYOGLOBIN in the last 168 hours.  Invalid input(s): CK ------------------------------------------------------------------------------------------------------------------ No results found for: BNP  Inpatient Medications  Scheduled Meds: . enoxaparin (LOVENOX) injection  40 mg Subcutaneous Q24H  . nicotine  21 mg Transdermal Daily  . pantoprazole  40 mg Oral Q0600    Continuous Infusions: . sodium chloride 100 mL/hr at 04/11/17 1317   PRN Meds:.acetaminophen **OR** acetaminophen, diphenhydrAMINE, hydrALAZINE, HYDROmorphone (DILAUDID) injection, ondansetron (ZOFRAN) IV, sodium chloride, zolpidem  Micro Results No results found for this or any previous visit (from the past 240 hour(s)).  Radiology Reports Ct Abdomen Pelvis W Contrast  Result Date: 04/09/2017 CLINICAL DATA:  Abdominal pain radiating to the back with nausea and vomiting EXAM: CT ABDOMEN AND PELVIS WITH CONTRAST TECHNIQUE: Multidetector CT imaging of the abdomen and pelvis was performed using the standard protocol following bolus administration of intravenous contrast. CONTRAST:  100mL ISOVUE-300 IOPAMIDOL (ISOVUE-300) INJECTION 61% COMPARISON:  12/19/2016, 08/01/2016 FINDINGS: Lower chest: Lung bases demonstrate no acute consolidation or pleural effusion. Hepatobiliary: Subtle micro nodularity of the contour. No focal hepatic abnormality. Mild steatosis. No calcified gallstones or biliary dilatation. Pancreas: Scattered calcifications consistent with chronic pancreatitis. Slight prominence of the pancreatic duct. Decreased hypodensity at the tail of the pancreas. Mild to moderate fluid and edema surrounding the head, neck, and uncinate process of the pancreas with poorly defined appearance of pancreas suspicious for pancreatitis. No increasing focal fluid collections at the pancreas bed. Spleen: Interval splenectomy. Residual soft tissue density in the left upper quadrant may reflect resolving postoperative change versus splenule. Adrenals/Urinary Tract: Adrenal glands are within normal limits. Kidneys show no hydronephrosis. Subcentimeter hypodensity in the right kidney too small to further characterize. Bladder within normal limits. Stomach/Bowel: Stomach is nonenlarged. Further decrease in the focal Jentsch thickening of the proximal stomach. Mild inflammatory change at the proximal duodenum. No  dilated small bowel. No colon Mccumbers thickening. High density material in the appendix but no inflammation. Vascular/Lymphatic: Aortic atherosclerosis. Nonspecific epigastric and retroperitoneal subcentimeter lymph nodes. 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 Normal enhancement of the portal vein. Suspected marked narrowing or partial occlusion of the SMV near its expected confluence with the splenic vein. Reproductive: Slightly enlarged prostate. Other: Negative for free air.  Small fat in the left inguinal canal. Musculoskeletal: Old postsurgical and posttraumatic deformity of the proximal left femur. IMPRESSION: 1. Ill-defined appearance of the pancreas with moderate fluid and edema surrounding the pancreatic head, neck hands uncinate process suspicious for acute pancreatitis. Multiple pancreatic calcifications consistent with sequela of chronic pancreatitis. No discrete organized fluid collections at the pancreas on the current exam. 2. Interval splenectomy. Residual soft tissue density and fluid in the left upper quadrant may relate to splenule or resolving postsurgical changes. 3. 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 Suspected marked narrowing or partial occlusion of the proximal SMV at the expected location of splenic vein confluence. Normal portal vein enhancement. 4. Hepatic steatosis. Query micro nodularity of the liver contour as may be seen with cirrhosis. 5. Decreased gastric Krummel thickening since the prior study. Mild duodenum thickening may relate to duodenitis Electronically Signed   By: Jasmine PangKim  Fujinaga M.D.   On: 04/09/2017 14:25   [place] Abdomen Limited Ruq  Result Date: 04/10/2017 CLINICAL DATA:  Acute recurrent pancreatitis. Inpatient. Abdominal pain. EXAM: ULTRASOUND ABDOMEN LIMITED RIGHT UPPER QUADRANT COMPARISON:  04/09/2017 CT abdomen/pelvis. FINDINGS: Gallbladder:  No gallstones or definite gallbladder Foland  thickening visualized. No sonographic Murphy sign noted by sonographer. Common bile duct: Diameter: 5 mm Liver: Liver parenchyma is diffusely mildly echogenic. No definite liver surface irregularity. No liver mass. Portal vein is patent on color Doppler imaging with normal direction of blood flow towards the liver. IMPRESSION: 1. No cholelithiasis. 2. No biliary ductal dilatation. 3. Nonspecific diffusely mildly echogenic liver parenchyma, which could be due to hepatic steatosis and/or fibrosis. No overt cirrhosis. No liver mass. Consider outpatient hepatic elastography for further liver fibrosis risk stratification, as clinically warranted. Electronically Signed   By: Delbert Phenix M.D.   On: 04/10/2017 10:42    Time Spent in minutes  25 minutes  ELGERGAWY, DAWOOD M.D on 04/11/2017 at 2:52 PM  Between 7am to 7pm - Pager - [phone]  After 7pm go to [email or web address] - password [facility]  [facility] -  Office  [phone]

## 2017-04-11 NOTE — Progress Notes (Signed)
Patient tolerated full liquid diet. Pt denied N/V and or discomfort associated with diet. Will continue to monitor.

## 2017-04-11 NOTE — Progress Notes (Signed)
PHARMACIST - PHYSICIAN COMMUNICATION  DR:  Elgergawy CONCERNING: IV to Oral Route Change Policy  RECOMMENDATION: This patient is receiving diphenhydramine by the intravenous route.  Based on criteria approved by the Pharmacy and Therapeutics Committee, intravenous diphenhydramine is being converted to the equivalent oral dose form(s).   DESCRIPTION: These criteria include:  Diphenhydramine is not prescribed to treat or prevent a severe allergic reaction  Diphenhydramine is not prescribed as premedication prior to receiving blood product, biologic medication, antimicrobial, or chemotherapy agent  The patient has tolerated at least one dose of an oral or enteral medication  The patient has no evidence of active gastrointestinal bleeding or impaired GI absorption (gastrectomy, short bowel, patient on TNA or NPO).  The patient is not undergoing procedural sedation   If you have questions about this conversion, please contact the Pharmacy Department  []   [phone]( [phone] )  [facility] []   [phone]( [phone] )  [facility] []   [phone]( [phone] )  [facility] []   [phone]( [phone] )  [facility] [x]   [phone]( [phone] )  [facility]     Greer PickerelJigna Laquanta Hummel, PharmD, [place] Pager: [phone] 04/11/2017 10:36 AM

## 2017-04-12 DIAGNOSIS — K7030 Alcoholic cirrhosis of liver without ascites: Secondary | ICD-10-CM

## 2017-04-12 MED ORDER — FERROUS SULFATE 325 (65 FE) MG PO TABS: 325.0000 mg | ORAL_TABLET | Freq: Every day | ORAL | 3 refills | Status: DC

## 2017-04-12 MED FILL — Hydromorphone HCl Inj 2 MG/ML: INTRAMUSCULAR | Qty: 1 | Status: AC

## 2017-04-12 NOTE — Discharge Summary (Signed)
Samuel Huffman, is a 51 y.o. male  DOB 1966/01/22  MRN [id number].  Admission date:  04/09/2017  Admitting Physician  Lorretta Harp, MD  Discharge Date:  04/12/2017   Primary MD  Copland, Gwenlyn Found, MD  Recommendations for primary care physician for things to follow:  - stick CBC, CMP during next visit   Admission Diagnosis  Acute pancreatitis without infection or necrosis, unspecified pancreatitis type [K85.90]   Discharge Diagnosis  Acute pancreatitis without infection or necrosis, unspecified pancreatitis type [K85.90]    Principal Problem:   Acute recurrent pancreatitis Active Problems:   History of alcohol abuse   Microcytic anemia   SIRS (systemic inflammatory response syndrome) (HCC)   Liver cirrhosis (HCC)   GERD (gastroesophageal reflux disease)      Past Medical History:  Diagnosis Date  . Acute upper GI bleeding 08/02/2016  . Anxiety   . Blood transfusion without reported diagnosis   . Cirrhosis of liver with ascites (HCC)   . Hiatal hernia   . History of alcohol abuse   . Hypertension   . Insomnia   . Pancreatic pseudocyst   . Pancreatitis   . Portal hypertensive gastropathy (HCC)   . PTSD (post-traumatic stress disorder)   . Pulmonary nodule   . Varices, gastric     Past Surgical History:  Procedure Laterality Date  . ESOPHAGOGASTRODUODENOSCOPY N/A 12/18/2016   Procedure: ESOPHAGOGASTRODUODENOSCOPY (EGD);  Surgeon: Hilarie Fredrickson, MD;  Location: [facility];  Service: Endoscopy;  Laterality: N/A;  . ESOPHAGOGASTRODUODENOSCOPY (EGD) WITH PROPOFOL N/A 08/03/2016   Procedure: ESOPHAGOGASTRODUODENOSCOPY (EGD) WITH PROPOFOL;  Surgeon: Rachael Fee, MD;  Location: [facility] ENDOSCOPY;  Service: Endoscopy;  Laterality: N/A;  . EUS  08/03/2016   Procedure: UPPER ENDOSCOPIC ULTRASOUND (EUS) LINEAR;  Surgeon: Rachael Fee, MD;  Location: [facility] ENDOSCOPY;  Service: Endoscopy;;  . FEMUR  SURGERY Left   . LAPAROSCOPY N/A 12/21/2016   Procedure: LAPAROSCOPY LYSIS OF ADHESIONS, EXPLORATORY LAPAROTOMY, SPLENECTOMY;  Surgeon: Avel Peace, MD;  Location: [facility] ORS;  Service: General;  Laterality: N/A;  . SPLENECTOMY         History of present illness and  Hospital Course:     Kindly see H&P for history of present illness and admission details, please review complete Labs, Consult reports and Test reports for all details in brief  HPI  from the history and physical done on the day of admission 04/10/2017 HPI: Samuel Huffman is a 51 y.o. male with medical history significant of alcohol abuse in remission, tobacco abuse, alcoholic pancreatitis, hypertension, alcoholic liver cirrhosis, GERD, anxiety, gastric varices, PTSD, upper GI bleeding, s/p of splenectomy, who presents with abdominal pain, nausea vomiting.  Patient states that his abdominal pain started yesterday It is located in central abdomen, constant, 10 out of 10 in severity, sharp, radiating to the back. It is associated with nausea and vomiting. He vomited twice without blood in the vomitus today. No diarrhea. Denies fever or chills. He does not have chest pain, shortness breath, cough, symptoms of  UTI or unilateral weakness. Patient states that he did not drink alcohol for years.  Pt was seen in ED in the morning, found to have lipase 297, and discharged home with pain medication and nausea medication, but patient continues to have abdominal pain, therefore comes back to ED for further evaluation and treatment.  ED Course: pt was found to have WBC 13.1, lipase 155, electrolytes renal function okay, temperature normal, tachycardia, tachypnea, oxygen saturation session 100% on room air. CT abdomen/pelvis showed acute pancreatitis. Pt is admitted to tele bed as inpt.    Hospital Course    Recurrent acute on chronic pancreatitis and SIRS:  - Patient has elevated lipase.CT abdomen evidence for pancreatitis,patient with  significant abdominal pain, nausea and vomiting on presentation, was kept nothing by mouth, on IV fluids, pain significantly improved, he was started on full liquid diet yesterday and advanced to soft diet, low-fat which he tolerated, is feeling much better today, he will be discharged home today. - GI input greatly appreciated, no evidence of SMV thrombosis, - Patient quit alcohol abuse for a few years ago.  History of alcohol abuse:  -in remission  Tobacco abuse: -Did counseling , He only smokes 1-2 cigarettes per day  deficiencyanemia: - iron deficiency anemia, as low 8.9 yesterday, most likely dilutional ,started iron supplements on discharge  GERD: -Protonix  Liver cirrhosis (HCC):  - mental status is normal.liver function normal on admission. -GI follow-up scheduled as an outpatient      Discharge Condition:  stable   Follow UP  Follow-up Information    Rachael Fee, MD Follow up on 06/19/2017.   Specialty:  Gastroenterology Why:  9:00 am Contact information: [address] [place] [address] [phone]             Discharge Instructions  and  Discharge Medications    Discharge Instructions    Discharge instructions    Complete by:  As directed    Follow with Primary MD Copland, Gwenlyn Found, MD in 7 days   Get CBC, CMP, checked  by Primary MD next visit.    Activity: As tolerated with Full fall precautions use walker/cane & assistance as needed   Disposition Home    Diet: Heart Healthy , low fat ,  On your next visit with your primary care physician please Get Medicines reviewed and adjusted.   Please request your Prim.MD to go over all Hospital Tests and Procedure/Radiological results at the follow up, please get all Hospital records sent to your Prim MD by signing hospital release before you go home.   If you experience worsening of your admission symptoms, develop shortness of breath, life threatening emergency, suicidal  or homicidal thoughts you must seek medical attention immediately by calling 911 or calling your MD immediately  if symptoms less severe.  You Must read complete instructions/literature along with all the possible adverse reactions/side effects for all the Medicines you take and that have been prescribed to you. Take any new Medicines after you have completely understood and accpet all the possible adverse reactions/side effects.   Do not drive, operating heavy machinery, perform activities at heights, swimming or participation in water activities or provide baby sitting services if your were admitted for syncope or siezures until you have seen by Primary MD or a Neurologist and advised to do so again.  Do not drive when taking Pain medications.    Do not take more than prescribed Pain, Sleep and Anxiety Medications  Special Instructions: If  you have smoked or chewed Tobacco  in the last 2 yrs please stop smoking, stop any regular Alcohol  and or any Recreational drug use.  Wear Seat belts while driving.   Please note  You were cared for by a hospitalist during your hospital stay. If you have any questions about your discharge medications or the care you received while you were in the hospital after you are discharged, you can call the unit and asked to speak with the hospitalist on call if the hospitalist that took care of you is not available. Once you are discharged, your primary care physician will handle any further medical issues. Please note that NO REFILLS for any discharge medications will be authorized once you are discharged, as it is imperative that you return to your primary care physician (or establish a relationship with a primary care physician if you do not have one) for your aftercare needs so that they can reassess your need for medications and monitor your lab values.   Increase activity slowly    Complete by:  As directed      Allergies as of 04/12/2017   No Known  Allergies     Medication List    TAKE these medications   ferrous sulfate 325 (65 FE) MG tablet Commonly known as:  FERROUSUL Take 1 tablet (325 mg total) by mouth daily with breakfast.   morphine 15 MG tablet Commonly known as:  MSIR Take 1 tablet (15 mg total) by mouth every 4 (four) hours as needed for severe pain.   ondansetron 4 MG disintegrating tablet Commonly known as:  ZOFRAN ODT 4mg  ODT q4 hours prn nausea/vomit   pantoprazole 40 MG tablet Commonly known as:  PROTONIX Take 1 tablet (40 mg total) by mouth daily at 6 (six) AM.         Diet and Activity recommendation: See Discharge Instructions above   Consults obtained -  GI   Major procedures and Radiology Reports - PLEASE review detailed and final reports for all details, in brief -      Ct Abdomen Pelvis W Contrast  Result Date: 04/09/2017 CLINICAL DATA:  Abdominal pain radiating to the back with nausea and vomiting EXAM: CT ABDOMEN AND PELVIS WITH CONTRAST TECHNIQUE: Multidetector CT imaging of the abdomen and pelvis was performed using the standard protocol following bolus administration of intravenous contrast. CONTRAST:  ISOVUE-300 IOPAMIDOL (ISOVUE-300) INJECTION 61% COMPARISON:  12/19/2016, 08/01/2016 FINDINGS: Lower chest: Lung bases demonstrate no acute consolidation or pleural effusion. Hepatobiliary: Subtle micro nodularity of the contour. No focal hepatic abnormality. Mild steatosis. No calcified gallstones or biliary dilatation. Pancreas: Scattered calcifications consistent with chronic pancreatitis. Slight prominence of the pancreatic duct. Decreased hypodensity at the tail of the pancreas. Mild to moderate fluid and edema surrounding the head, neck, and uncinate process of the pancreas with poorly defined appearance of pancreas suspicious for pancreatitis. No increasing focal fluid collections at the pancreas bed. Spleen: Interval splenectomy. Residual soft tissue density in the left upper  quadrant may reflect resolving postoperative change versus splenule. Adrenals/Urinary Tract: Adrenal glands are within normal limits. Kidneys show no hydronephrosis. Subcentimeter hypodensity in the right kidney too small to further characterize. Bladder within normal limits. Stomach/Bowel: Stomach is nonenlarged. Further decrease in the focal Bodley thickening of the proximal stomach. Mild inflammatory change at the proximal duodenum. No dilated small bowel. No colon Dinius thickening. High density material in the appendix but no inflammation. Vascular/Lymphatic: Aortic atherosclerosis. Nonspecific epigastric and retroperitoneal subcentimeter lymph nodes.  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 Normal enhancement of the portal vein. Suspected marked narrowing or partial occlusion of the SMV near its expected confluence with the splenic vein. Reproductive: Slightly enlarged prostate. Other: Negative for free air.  Small fat in the left inguinal canal. Musculoskeletal: Old postsurgical and posttraumatic deformity of the proximal left femur. IMPRESSION: 1. Ill-defined appearance of the pancreas with moderate fluid and edema surrounding the pancreatic head, neck hands uncinate process suspicious for acute pancreatitis. Multiple pancreatic calcifications consistent with sequela of chronic pancreatitis. No discrete organized fluid collections at the pancreas on the current exam. 2. Interval splenectomy. Residual soft tissue density and fluid in the left upper quadrant may relate to splenule or resolving postsurgical changes. 3. 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 Suspected marked narrowing or partial occlusion of the proximal SMV at the expected location of splenic vein confluence. Normal portal vein enhancement. 4. Hepatic steatosis. Query micro nodularity of the liver contour as may be seen with cirrhosis. 5. Decreased gastric Seddon  thickening since the prior study. Mild duodenum thickening may relate to duodenitis Electronically Signed   By: Jasmine Pang M.D.   On: 04/09/2017 14:25   US Abdomen Limited Ruq  Result Date: 04/10/2017 CLINICAL DATA:  Acute recurrent pancreatitis. Inpatient. Abdominal pain. EXAM: ULTRASOUND ABDOMEN LIMITED RIGHT UPPER QUADRANT COMPARISON:  04/09/2017 CT abdomen/pelvis. FINDINGS: Gallbladder: No gallstones or definite gallbladder Umbarger thickening visualized. No sonographic Murphy sign noted by sonographer. Common bile duct: Diameter: 5 mm Liver: Liver parenchyma is diffusely mildly echogenic. No definite liver surface irregularity. No liver mass. Portal vein is patent on color Doppler imaging with normal direction of blood flow towards the liver. IMPRESSION: 1. No cholelithiasis. 2. No biliary ductal dilatation. 3. Nonspecific diffusely mildly echogenic liver parenchyma, which could be due to hepatic steatosis and/or fibrosis. No overt cirrhosis. No liver mass. Consider outpatient hepatic elastography for further liver fibrosis risk stratification, as clinically warranted. Electronically Signed   By: Delbert Phenix M.D.   On: 04/10/2017 10:42    Micro Results    Recent Results (from the past 240 hour(s))  Culture, blood (x 2)     Status: None (Preliminary result)   Collection Time: 04/10/17  3:22 AM  Result Value Ref Range Status   Specimen Description BLOOD LEFT ARM  Final   Special Requests IN PEDIATRIC BOTTLE Blood Culture adequate volume  Final   Culture   Final    NO GROWTH 1 DAY Performed at [facility], [address], [place], [place] [address]    Report Status PENDING  Incomplete  Culture, blood (x 2)     Status: None (Preliminary result)   Collection Time: 04/10/17  7:28 AM  Result Value Ref Range Status   Specimen Description BLOOD LEFT ARM  Final   Special Requests AEROBIC BOTTLE ONLY Blood Culture adequate volume  Final   Culture   Final    NO GROWTH 1 DAY Performed at  [facility], [address], [place], [place] [address]    Report Status PENDING  Incomplete       Today   Subjective:   Samuel Huffman today has no headache,no chest or abdominal pain,no nausea or vomiting, tolerating soft diet, feels much better wants to go home today.   Objective:   Blood pressure 137/76, pulse 70, temperature 97.8 F (36.6 C), temperature source Oral, resp. rate 20, height 6\' 2"  (1.88 m), weight 93.6 kg (206  lb 5.6 oz), SpO2 99 %.   Intake/Output Summary (Last 24 hours) at 04/12/17 1222 Last data filed at 04/12/17 0900  Gross per 24 hour  Intake          1802.08 ml  Output                0 ml  Net          1802.08 ml    Exam Awake Alert, Oriented x 3, No new F.N deficits, Normal affect Supple Neck,No JVD, No cervical lymphadenopathy appriciated.  Symmetrical Chest Wimbush movement, Good air movement bilaterally, CTAB RRR,No Gallops,Rubs or new Murmurs, No Parasternal Heave +ve B.Sounds, Abd Soft, Non tender,, No rebound -guarding or rigidity. No Cyanosis, Clubbing or edema, No new Rash or bruise  Data Review   CBC w Diff: Lab Results  Component Value Date   WBC 13.2 (H) 04/11/2017   HGB 8.9 (L) 04/11/2017   HCT 28.5 (L) 04/11/2017   PLT 428 (H) 04/11/2017   LYMPHOPCT 8 04/09/2017   MONOPCT 8 04/09/2017   EOSPCT 0 04/09/2017   BASOPCT 0 04/09/2017    CMP: Lab Results  Component Value Date   NA 133 (L) 04/11/2017   K 3.4 (L) 04/11/2017   CL 99 (L) 04/11/2017   CO2 26 04/11/2017   BUN 9 04/11/2017   CREATININE 0.78 04/11/2017   PROT 8.7 (H) 04/09/2017   ALBUMIN 4.3 04/09/2017   BILITOT 0.8 04/09/2017   ALKPHOS 104 04/09/2017   AST 34 04/09/2017   ALT 17 04/09/2017  .   Total Time in preparing paper work, data evaluation and todays exam - 35 minutes  Miyeko Mahlum M.D on 04/12/2017 at 12:22 PM  [facility]   Office  [phone]

## 2017-04-12 NOTE — Progress Notes (Signed)
Patient is alert oriented x4, ambulatory without assist. Discharge instructions reviewed, prescription given. Pt request to schedule PCP follow up due to scheduling conflicts. Questions, concerns denied.

## 2017-04-12 NOTE — Discharge Instructions (Signed)
Follow with Primary MD Copland, Gwenlyn FoundJessica C, MD in 7 days   Get CBC, CMP, checked  by Primary MD next visit.    Activity: As tolerated with Full fall precautions use walker/cane & assistance as needed   Disposition Home    Diet: Heart Healthy , low fat ,  On your next visit with your primary care physician please Get Medicines reviewed and adjusted.   Please request your Prim.MD to go over all Hospital Tests and Procedure/Radiological results at the follow up, please get all Hospital records sent to your Prim MD by signing hospital release before you go home.   If you experience worsening of your admission symptoms, develop shortness of breath, life threatening emergency, suicidal or homicidal thoughts you must seek medical attention immediately by calling 911 or calling your MD immediately  if symptoms less severe.  You Must read complete instructions/literature along with all the possible adverse reactions/side effects for all the Medicines you take and that have been prescribed to you. Take any new Medicines after you have completely understood and accpet all the possible adverse reactions/side effects.   Do not drive, operating heavy machinery, perform activities at heights, swimming or participation in water activities or provide baby sitting services if your were admitted for syncope or siezures until you have seen by Primary MD or a Neurologist and advised to do so again.  Do not drive when taking Pain medications.    Do not take more than prescribed Pain, Sleep and Anxiety Medications  Special Instructions: If you have smoked or chewed Tobacco  in the last 2 yrs please stop smoking, stop any regular Alcohol  and or any Recreational drug use.  Wear Seat belts while driving.   Please note  You were cared for by a hospitalist during your hospital stay. If you have any questions about your discharge medications or the care you received while you were in the hospital after you  are discharged, you can call the unit and asked to speak with the hospitalist on call if the hospitalist that took care of you is not available. Once you are discharged, your primary care physician will handle any further medical issues. Please note that NO REFILLS for any discharge medications will be authorized once you are discharged, as it is imperative that you return to your primary care physician (or establish a relationship with a primary care physician if you do not have one) for your aftercare needs so that they can reassess your need for medications and monitor your lab values.

## 2017-04-15 LAB — CULTURE, BLOOD (ROUTINE X 2)
CULTURE: NO GROWTH
Culture: NO GROWTH
Special Requests: ADEQUATE
Special Requests: ADEQUATE

## 2017-06-19 ENCOUNTER — Ambulatory Visit: Payer: BLUE CROSS/BLUE SHIELD | Admitting: Gastroenterology

## 2017-07-27 NOTE — Progress Notes (Signed)
[facility] [address], [address] [place], [place] [address] [phone]  Date:  07/29/2017   Name:  Samuel Huffman   DOB:  03-07-1966   MRN:  [id number]  PCP:  Pearline Cables, MD    Chief Complaint: Follow-up   History of Present Illness:  Samuel Huffman is a 52 y.o. very pleasant male patient who presents with the following:  I last saw him in August:  Here today to establish care Repeat CBC and CMP today He is recovering well following recent splenectomy He has HTN listed on his history, but reports he has not taken BP meds in many years and his BP has been ok without BP looks ok today, will follow as he continues to regain his blood counts and strength  He was also admitted with pancreatitis in October----------------------------------------------------------- Admission date:  04/09/2017  Admitting Physician  Samuel Harp, MD Discharge Date:  04/12/2017  Primary MD  Copland, Gwenlyn Found, MD Recommendations for primary care physician for things to follow:  - stick CBC, CMP during next visit  Admission Diagnosis  Acute pancreatitis without infection or necrosis, unspecified pancreatitis type [K85.90] Discharge Diagnosis  Acute pancreatitis without infection or necrosis, unspecified pancreatitis type [K85.90]    Principal Problem:   Acute recurrent pancreatitis Active Problems:   History of alcohol abuse   Microcytic anemia   SIRS (systemic inflammatory response syndrome) (HCC)   Liver cirrhosis (HCC)   GERD (gastroesophageal reflux disease)     History of present illness and  Hospital Course:     HPI  from the history and physical done on the day of admission 04/10/2017 HPI: Samuel Huffman a 51 y.o.malewith medical history significant ofalcohol abuse in remission, tobacco abuse,alcoholic pancreatitis, hypertension, alcoholic liver cirrhosis, GERD, anxiety, gastric varices, PTSD, upper GI bleeding, s/p of  splenectomy, who presents with abdominal pain, nausea vomiting.  Patient states that his abdominal pain started yesterday It is located in central abdomen, constant, 10 out of 10 in severity, sharp, radiating to the back. It is associated with nausea and vomiting. He vomited twice without blood in the vomitus today. No diarrhea. Denies fever or chills. He does not have chest pain, shortness breath, cough, symptoms of UTI or unilateral weakness. Patient states that he did not drink alcohol for years. Pt was seen in ED in the morning, found to have lipase 297, and discharged home with pain medication and nausea medication, but patient continues to have abdominal pain, therefore comes back to ED for further evaluation and treatment.  ED Course:pt was found to have WBC 13.1, lipase 155, electrolytes renal function okay, temperature normal, tachycardia, tachypnea, oxygen saturation session 100% on room air. CT abdomen/pelvis showed acute pancreatitis. Pt is admitted to tele bed as inpt.  Recurrent acute on chronicpancreatitis and SIRS:  - Patient has elevated lipase.CT abdomen evidence for pancreatitis,patient with significant abdominal pain, nausea and vomiting on presentation, was kept nothing by mouth, on IV fluids, pain significantly improved, he was started on full liquid diet yesterday and advanced to soft diet, low-fat which he tolerated, is feeling much better today, he will be discharged home today. - GI input greatly appreciated, no evidence of SMV thrombosis, - Patient quit alcohol abuse for a few years ago. History of alcohol abuse:  -in remission Tobacco abuse: -Did counseling , He only smokes 1-2 cigarettes per day deficiencyanemia: - iron deficiency anemia, as low 8.9 yesterday, most likely dilutional ,  started iron supplements on discharge GERD: -Protonix Liver cirrhosis (HCC):  - mental status is normal.liver function normal on admission. -GI follow-up scheduled as an  outpatient --------------------------------------------------------------------------------------------------------------------------  Following up today- he is recovered fully from his pancreatitis. He is not aware of any triggers that cause his occasional bouts of pancreatitis  He is eating and drinking normally again and not having any abd pain  Last week he got sick with lung congestion and cough His surgeon had given him some abx to have on hand from his splenectomy, which he wisely started taking  He may have had a fever at first, but this is now resolved Flu:  Done in August  He would like to update any needed immunizations following his splenectomy today He had hib, pneumovax, and meningitis all x1 dose back in July of 18 His flu shot is also UTD  As his recent illness sx are now basically resolved and he is feeling well will go ahead and give boosters today His energy level is good today Also will replace his emergency abx rx for him to have on hand in case of fever  His son is getting married in May- he is very excited for the wedding and also about the prospect of grandchildren  Patient Active Problem List   Diagnosis Date Noted  . Microcytic anemia 04/10/2017  . SIRS (systemic inflammatory response syndrome) (HCC) 04/10/2017  . Liver cirrhosis (HCC) 04/10/2017  . GERD (gastroesophageal reflux disease) 04/10/2017  . Cirrhosis of liver with ascites (HCC)   . S/P laparoscopy 12/21/2016  . Bleeding gastric varices   . Hyponatremia 08/04/2016  . Hypokalemia 08/04/2016  . Abnormal CT scan, stomach   . Upper GI bleed 08/02/2016  . Hematemesis 08/02/2016  . Pancreatic pseudocyst   . Pulmonary nodule   . Acute recurrent pancreatitis 10/01/2014  . AKI (acute kidney injury) (HCC) 10/01/2014  . History of alcohol abuse 10/01/2014  . Lactic acidosis 10/01/2014  . High anion gap metabolic acidosis 10/01/2014  . Insomnia 07/05/2014    Past Medical History:  Diagnosis Date   . Acute upper GI bleeding 08/02/2016  . Anxiety   . Blood transfusion without reported diagnosis   . Cirrhosis of liver with ascites (HCC)   . Hiatal hernia   . History of alcohol abuse   . Hypertension   . Insomnia   . Pancreatic pseudocyst   . Pancreatitis   . Portal hypertensive gastropathy (HCC)   . PTSD (post-traumatic stress disorder)   . Pulmonary nodule   . Varices, gastric     Past Surgical History:  Procedure Laterality Date  . ESOPHAGOGASTRODUODENOSCOPY Huffman/A 12/18/2016   Procedure: ESOPHAGOGASTRODUODENOSCOPY (EGD);  Surgeon: Hilarie FredricksonPerry, Samuel N, MD;  [facility];  Service: Endoscopy;  Laterality: Huffman/A;  . ESOPHAGOGASTRODUODENOSCOPY (EGD) WITH PROPOFOL Huffman/A 08/03/2016   Procedure: ESOPHAGOGASTRODUODENOSCOPY (EGD) WITH PROPOFOL;  Surgeon: Rachael Feeaniel P Jacobs, MD;  [facility];  Service: Endoscopy;  Laterality: Huffman/A;  . EUS  08/03/2016   Procedure: UPPER ENDOSCOPIC ULTRASOUND (EUS) LINEAR;  Surgeon: Rachael Feeaniel P Jacobs, MD;  [facility];  Service: Endoscopy;;  . FEMUR SURGERY Left   . LAPAROSCOPY Huffman/A 12/21/2016   Procedure: LAPAROSCOPY LYSIS OF ADHESIONS, EXPLORATORY LAPAROTOMY, SPLENECTOMY;  Surgeon: Samuel Huffman, Todd, MD;  Location: [facility]S;  Service: General;  Laterality: Huffman/A;  . SPLENECTOMY      Social History   Tobacco Use  . Smoking status: Current Every Day Smoker    Packs/day: 0.50    Types: Cigarettes  .  Smokeless tobacco: Current User    Types: Snuff  Substance Use Topics  . Alcohol use: No    Comment: prior  . Drug use: No    Family History  Adopted: Yes  Problem Relation Age of Onset  . Healthy Son        x1    No Known Allergies  Medication list has been reviewed and updated.  Current Outpatient Medications on File Prior to Visit  Medication Sig Dispense Refill  . ferrous sulfate (FERROUSUL) 325 (65 FE) MG tablet Take 1 tablet (325 mg total) by mouth daily with breakfast. 30 tablet 3  . morphine (MSIR) 15 MG tablet Take 1 tablet  (15 mg total) by mouth every 4 (four) hours as needed for severe pain. 10 tablet 0  . ondansetron (ZOFRAN ODT) 4 MG disintegrating tablet 4mg  ODT q4 hours prn nausea/vomit 20 tablet 0  . pantoprazole (PROTONIX) 40 MG tablet Take 1 tablet (40 mg total) by mouth daily at 6 (six) AM. 30 tablet 11   No current facility-administered medications on file prior to visit.     Review of Systems:  As per HPI- otherwise negative.   Physical Examination: Vitals:   07/29/17 1532  BP: 138/76  Pulse: (!) 108  Resp: 18  Temp: 98.6 F (37 C)  SpO2: 98%   Vitals:   07/29/17 1532  Weight: 213 lb 3.2 oz (96.7 kg)  Height: 6\' 2"  (1.88 m)   Body mass index is 27.37 kg/m. Ideal Body Weight: Weight in (lb) to have BMI = 25: 194.3  GEN: WDWN, NAD, Non-toxic, A & O x 3, looks well today HEENT: Atraumatic, Normocephalic. Neck supple. No masses, No LAD.  Bilateral TM wnl, oropharynx normal.  PEERL,EOMI.   Ears and Nose: No external deformity. CV: RRR, No M/G/R. No JVD. No thrill. No extra heart sounds. PULM: CTA B, no wheezes, crackles, rhonchi. No retractions. No resp. distress. No accessory muscle use. EXTR: No c/c/e NEURO Normal gait.  PSYCH: Normally interactive. Conversant. Not depressed or anxious appearing.  Calm demeanor.    Assessment and Plan: H/O splenectomy - Plan: Meningococcal B, OMV (Bexsero), CBC, Comprehensive metabolic panel, amoxicillin-clavulanate (AUGMENTIN) 875-125 MG tablet, Meningococcal MCV4O(Menveo), CANCELED: Meningococcal conjugate vaccine 4-valent IM  Cough  Recent cough which he is treating with abx.  I am not quite sure what he is taking- may be amoxicililn.  In any case he is feeling well and is afebrile today.  I did replace his supply of emergency abx with augmenting to have on hand (and take if any fever since he has no spllen) Gave his 2nd quad meningitis vaccine today and his 2nd med B Did not give his prevnar today as he is getting a quad meningitis shot and I  was not sure if menveo or menactra.  Will plan to update this at his next appt  Ordered labs for him today but it seems he forgot to stop at lab.  We will call him and remind to have these done, changed orders to future  Provided written info about immunizations needed for patients with splenectomy  Signed Abbe Amsterdam, MD

## 2017-07-29 ENCOUNTER — Ambulatory Visit: Payer: BLUE CROSS/BLUE SHIELD | Admitting: Family Medicine

## 2017-07-29 ENCOUNTER — Encounter: Payer: Self-pay | Admitting: Family Medicine

## 2017-07-29 VITALS — BP 138/76 | HR 108 | Temp 98.6°F | Resp 18 | Ht 74.0 in | Wt 213.2 lb

## 2017-07-29 DIAGNOSIS — R059 Cough, unspecified: Secondary | ICD-10-CM

## 2017-07-29 DIAGNOSIS — R05 Cough: Secondary | ICD-10-CM

## 2017-07-29 DIAGNOSIS — Z9081 Acquired absence of spleen: Secondary | ICD-10-CM

## 2017-07-29 DIAGNOSIS — Z23 Encounter for immunization: Secondary | ICD-10-CM | POA: Diagnosis not present

## 2017-07-29 MED ORDER — AMOXICILLIN-POT CLAVULANATE 875-125 MG PO TABS
1.0000 | ORAL_TABLET | Freq: Two times a day (BID) | ORAL | 0 refills | Status: DC
Start: 2017-07-29 — End: 2018-05-27

## 2017-07-29 NOTE — Patient Instructions (Addendum)
Good to see you today!  I refilled your augmentin to have on hand in case of any fever - start this and seek medical care if you have a fever or are otherwise not well I will be in touch with your labs  We will want to repeat your meningitis B vaccine and give you the 2nd pneumonia vaccine later this year- will need to wait at least 1 month   Pneumococcal vaccination - Two types of pneumococcal vaccination are recommended for patients with impaired splenic function in the [place]: PCV13 (Prevnar) and PPSV23 (Pneumovax). (See "Pneumococcal vaccination in adults".) These vaccines are given as a primary series, followed by revaccination (booster) with PPSV23 (algorithm 1). .For most adults, we give a single dose of PCV13 followed by PPSV23 ?8 weeks later. .We revaccinate with PPSV23 every five years thereafter. Some adults (eg, those >52 years old, those with a remote diagnosis of asplenia, or those with concurrent immunocompromising conditions) may have received PCV13, PPSV23, or both. .For adults who have previously received a dose of PCV13 (but not PPSV23), we give a single dose of PPSV23 ?8 weeks after the PCV13 dose. .For adults who have previously received PPSV23 (but not PCV13), we give a single PCV13 dose ?1 year after the most recent PPSV23 dose. .For adults who have previously received both PCV13 and PPSV23, we revaccinate with PPSV23 five years after the last PPSV23. .For all, we revaccinate with PPSV23 every five years. ?H. influenzae type b vaccination - A single dose of Hib vaccine is recommended prior to splenectomy or for patients with asplenia or hyposplenism. Revaccination is not needed for Hib [30,31]. .For adults who have not been previously vaccinated against Hib (or if vaccination status is unknown), we give a single dose of Hib. Because most adults are immune to Hib (regardless of vaccine status), some experts prefer to check a Hib antibody titer and vaccinate only if titers  are low or undetectable. .For adults who have received one or more doses of Hib for other reasons (eg, concurrent immunocompromising condition), repeating this vaccination is not necessary. Additional details on the prevention of Hib are discussed separately. (See "Prevention of Haemophilus influenzae type b infection".) ?Meningococcal vaccination - There are two types of meningococcal vaccinations recommended for patients with impaired splenic function in the [place]: a quadrivalent meningococcal conjugate vaccine that protects against meningococcal serotypes A, C, W, and Y (MenACWY; Menactra or Menveo) and a univalent serogroup B vaccine (MenB-4C [Bexsero] or MenB-FHbp [Trumenba]). Both vaccines are given as a primary series. Only the quadrivalent vaccines require revaccination. (See "Meningococcal vaccines".) .For most adults, we give two doses of either MenACWY vaccine (Menveo or Menactra) ?8 weeks apart. Menveo can be given at the same time as PCV13. However, Menactra must be given four weeks after PCV13 because Menactra may interfere with the protection conferred by pneumococcal conjugate vaccines. Both Menactra and Menveo require revaccination every five years. .In addition, we vaccinate adults against meningococcus serogroup B, either with Bexsero (two doses spaced at least one month apart) or Trumenba (three doses at 0, 1 to 2, and 6 months). Neither require revaccination. ?Seasonal influenza vaccination - We recommend that all asplenic or hyposplenic patients be vaccinated against seasonal influenza annually. Although live attenuated vaccines can be safely given to patients with impaired splenic function, the inactivated influenza vaccine is preferred over the live formulation because it is equally effective

## 2017-09-27 ENCOUNTER — Ambulatory Visit (INDEPENDENT_AMBULATORY_CARE_PROVIDER_SITE_OTHER): Payer: BLUE CROSS/BLUE SHIELD | Admitting: Emergency Medicine

## 2017-09-27 DIAGNOSIS — Z23 Encounter for immunization: Secondary | ICD-10-CM

## 2017-09-27 NOTE — Progress Notes (Signed)
Pre visit review using our clinic review tool, if applicable. No additional management support is needed unless otherwise documented below in the visit note.   Pt here for Men B and Prevnar 13 vaccine per Dr. Patsy Lageropland.   Meningococcal B given in left deltoid and Prevnar 13 given in right deltoid.   Pt tolerated injections well.

## 2017-11-13 ENCOUNTER — Other Ambulatory Visit: Payer: Self-pay | Admitting: Gastroenterology

## 2017-12-12 ENCOUNTER — Other Ambulatory Visit: Payer: Self-pay | Admitting: Gastroenterology

## 2017-12-20 ENCOUNTER — Encounter: Payer: Self-pay | Admitting: Internal Medicine

## 2017-12-20 ENCOUNTER — Telehealth: Payer: Self-pay | Admitting: Gastroenterology

## 2017-12-20 NOTE — Telephone Encounter (Signed)
error 

## 2017-12-23 ENCOUNTER — Other Ambulatory Visit: Payer: Self-pay | Admitting: Gastroenterology

## 2017-12-23 MED ORDER — PANTOPRAZOLE SODIUM 40 MG PO TBEC: 40.0000 mg | DELAYED_RELEASE_TABLET | Freq: Every day | ORAL | 11 refills | Status: DC

## 2017-12-23 NOTE — Telephone Encounter (Signed)
Pantoprazole sent to pharmacy. 

## 2018-03-04 ENCOUNTER — Telehealth: Payer: Self-pay | Admitting: Family Medicine

## 2018-03-04 NOTE — Telephone Encounter (Signed)
Please give him a call back-  It looks like he came back and got his 2nd meningitis B shot and his 2nd pneumonia shot in April He does need one more regular meningitis shot and his flu shot- these can be schedule at his conveneince

## 2018-03-04 NOTE — Telephone Encounter (Signed)
Copied from CRM [id number]. Topic: Quick Communication - See Telephone Encounter >> Mar 04, 2018  2:41 PM Raquel Sarna wrote: Spleen was removed and pt is checking to see if his vaccines are current. Please call pt back to let him know. If pt needs to come in to the office he will be on vacation week of the 16th.

## 2018-03-10 NOTE — Telephone Encounter (Signed)
Pt has appt scheduled for 03/11/18.

## 2018-03-11 ENCOUNTER — Ambulatory Visit (INDEPENDENT_AMBULATORY_CARE_PROVIDER_SITE_OTHER): Payer: BLUE CROSS/BLUE SHIELD

## 2018-03-11 ENCOUNTER — Telehealth: Payer: Self-pay

## 2018-03-11 DIAGNOSIS — Z23 Encounter for immunization: Secondary | ICD-10-CM | POA: Diagnosis not present

## 2018-03-11 NOTE — Telephone Encounter (Signed)
He will need a pneumovax 23 5 years after his most recent pneumovax (which was in 2018).  So this will be due in 2023

## 2018-03-11 NOTE — Telephone Encounter (Signed)
Patient would like to know when his next Pneumonia immunization is due. He has received last 12/2016, 09/2017. 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 Please advise.

## 2018-03-12 NOTE — Telephone Encounter (Signed)
Patient notified

## 2018-05-27 ENCOUNTER — Encounter (HOSPITAL_BASED_OUTPATIENT_CLINIC_OR_DEPARTMENT_OTHER): Payer: Self-pay | Admitting: *Deleted

## 2018-05-27 ENCOUNTER — Emergency Department (HOSPITAL_BASED_OUTPATIENT_CLINIC_OR_DEPARTMENT_OTHER)
Admission: EM | Admit: 2018-05-27 | Discharge: 2018-05-27 | Disposition: A | Discharge status: Home / Self Care | Payer: BLUE CROSS/BLUE SHIELD | Attending: Emergency Medicine | Admitting: Emergency Medicine

## 2018-05-27 ENCOUNTER — Other Ambulatory Visit: Payer: Self-pay

## 2018-05-27 ENCOUNTER — Ambulatory Visit: Payer: Self-pay | Admitting: *Deleted

## 2018-05-27 ENCOUNTER — Encounter (HOSPITAL_BASED_OUTPATIENT_CLINIC_OR_DEPARTMENT_OTHER): Payer: Self-pay | Admitting: Emergency Medicine

## 2018-05-27 ENCOUNTER — Emergency Department (HOSPITAL_BASED_OUTPATIENT_CLINIC_OR_DEPARTMENT_OTHER)
Admission: EM | Admit: 2018-05-27 | Discharge: 2018-05-27 | Disposition: A | Discharge status: Home / Self Care | Payer: BLUE CROSS/BLUE SHIELD | Source: Home / Self Care | Attending: Emergency Medicine | Admitting: Emergency Medicine

## 2018-05-27 ENCOUNTER — Emergency Department (HOSPITAL_BASED_OUTPATIENT_CLINIC_OR_DEPARTMENT_OTHER): Payer: BLUE CROSS/BLUE SHIELD

## 2018-05-27 DIAGNOSIS — R945 Abnormal results of liver function studies: Secondary | ICD-10-CM | POA: Diagnosis not present

## 2018-05-27 DIAGNOSIS — Z79899 Other long term (current) drug therapy: Secondary | ICD-10-CM | POA: Insufficient documentation

## 2018-05-27 DIAGNOSIS — R748 Abnormal levels of other serum enzymes: Secondary | ICD-10-CM | POA: Diagnosis not present

## 2018-05-27 DIAGNOSIS — K859 Acute pancreatitis without necrosis or infection, unspecified: Secondary | ICD-10-CM

## 2018-05-27 DIAGNOSIS — I10 Essential (primary) hypertension: Secondary | ICD-10-CM | POA: Insufficient documentation

## 2018-05-27 DIAGNOSIS — R109 Unspecified abdominal pain: Secondary | ICD-10-CM | POA: Diagnosis not present

## 2018-05-27 DIAGNOSIS — F17210 Nicotine dependence, cigarettes, uncomplicated: Secondary | ICD-10-CM | POA: Insufficient documentation

## 2018-05-27 LAB — CBC WITH DIFFERENTIAL/PLATELET
Abs Immature Granulocytes: 0.05 10*3/uL (ref 0.00–0.07)
Abs Immature Granulocytes: 0.06 10*3/uL (ref 0.00–0.07)
Basophils Absolute: 0.1 10*3/uL (ref 0.0–0.1)
Basophils Absolute: 0 10*3/uL (ref 0.0–0.1)
Basophils Relative: 1 %
Basophils Relative: 0 %
Eosinophils Absolute: 0.4 10*3/uL (ref 0.0–0.5)
Eosinophils Absolute: 0 10*3/uL (ref 0.0–0.5)
Eosinophils Relative: 3 %
Eosinophils Relative: 0 %
HCT: 40.4 % (ref 39.0–52.0)
HCT: 40.6 % (ref 39.0–52.0)
Hemoglobin: 13.4 g/dL (ref 13.0–17.0)
Hemoglobin: 13.3 g/dL (ref 13.0–17.0)
Immature Granulocytes: 0 %
Immature Granulocytes: 1 %
Lymphocytes Relative: 8 %
Lymphocytes Relative: 7 %
Lymphs Abs: 1.1 10*3/uL (ref 0.7–4.0)
Lymphs Abs: 0.9 10*3/uL (ref 0.7–4.0)
MCH: 32.7 pg (ref 26.0–34.0)
MCH: 32.2 pg (ref 26.0–34.0)
MCHC: 33.2 g/dL (ref 30.0–36.0)
MCHC: 32.8 g/dL (ref 30.0–36.0)
MCV: 98.5 fL (ref 80.0–100.0)
MCV: 98.3 fL (ref 80.0–100.0)
Monocytes Absolute: 0.7 10*3/uL (ref 0.1–1.0)
Monocytes Absolute: 0.6 10*3/uL (ref 0.1–1.0)
Monocytes Relative: 5 %
Monocytes Relative: 5 %
Neutro Abs: 10.4 10*3/uL — ABNORMAL HIGH (ref 1.7–7.7)
Neutro Abs: 11.3 10*3/uL — ABNORMAL HIGH (ref 1.7–7.7)
Neutrophils Relative %: 83 %
Neutrophils Relative %: 87 %
Platelets: 352 10*3/uL (ref 150–400)
Platelets: 346 10*3/uL (ref 150–400)
RBC: 4.1 MIL/uL — ABNORMAL LOW (ref 4.22–5.81)
RBC: 4.13 MIL/uL — ABNORMAL LOW (ref 4.22–5.81)
RDW: 12.5 % (ref 11.5–15.5)
RDW: 12.8 % (ref 11.5–15.5)
WBC: 12.7 10*3/uL — ABNORMAL HIGH (ref 4.0–10.5)
WBC: 12.9 10*3/uL — ABNORMAL HIGH (ref 4.0–10.5)
nRBC: 0 % (ref 0.0–0.2)
nRBC: 0 % (ref 0.0–0.2)

## 2018-05-27 LAB — COMPREHENSIVE METABOLIC PANEL
ALT: 89 U/L — ABNORMAL HIGH (ref 0–44)
ALT: 74 U/L — ABNORMAL HIGH (ref 0–44)
AST: 203 U/L — ABNORMAL HIGH (ref 15–41)
AST: 108 U/L — ABNORMAL HIGH (ref 15–41)
Albumin: 4.4 g/dL (ref 3.5–5.0)
Albumin: 4.5 g/dL (ref 3.5–5.0)
Alkaline Phosphatase: 148 U/L — ABNORMAL HIGH (ref 38–126)
Alkaline Phosphatase: 148 U/L — ABNORMAL HIGH (ref 38–126)
Anion gap: 12 (ref 5–15)
Anion gap: 12 (ref 5–15)
BUN: 19 mg/dL (ref 6–20)
BUN: 19 mg/dL (ref 6–20)
CO2: 22 mmol/L (ref 22–32)
CO2: 23 mmol/L (ref 22–32)
Calcium: 9.3 mg/dL (ref 8.9–10.3)
Calcium: 9.4 mg/dL (ref 8.9–10.3)
Chloride: 97 mmol/L — ABNORMAL LOW (ref 98–111)
Chloride: 97 mmol/L — ABNORMAL LOW (ref 98–111)
Creatinine, Ser: 0.92 mg/dL (ref 0.61–1.24)
Creatinine, Ser: 0.93 mg/dL (ref 0.61–1.24)
GFR calc Af Amer: >60 mL/min (ref >60)
GFR calc Af Amer: >60 mL/min (ref >60)
GFR calc non Af Amer: >60 mL/min (ref >60)
GFR calc non Af Amer: >60 mL/min (ref >60)
Glucose, Bld: 165 mg/dL — ABNORMAL HIGH (ref 70–99)
Glucose, Bld: 159 mg/dL — ABNORMAL HIGH (ref 70–99)
Potassium: 4.1 mmol/L (ref 3.5–5.1)
Potassium: 3.7 mmol/L (ref 3.5–5.1)
Sodium: 131 mmol/L — ABNORMAL LOW (ref 135–145)
Sodium: 132 mmol/L — ABNORMAL LOW (ref 135–145)
Total Bilirubin: 1.1 mg/dL (ref 0.3–1.2)
Total Bilirubin: 1.3 mg/dL — ABNORMAL HIGH (ref 0.3–1.2)
Total Protein: 8 g/dL (ref 6.5–8.1)
Total Protein: 8.2 g/dL — ABNORMAL HIGH (ref 6.5–8.1)

## 2018-05-27 LAB — URINALYSIS, ROUTINE W REFLEX MICROSCOPIC
Glucose, UA: NEGATIVE mg/dL
Hgb urine dipstick: NEGATIVE
Ketones, ur: >80 mg/dL — AB
Leukocytes, UA: NEGATIVE
Nitrite: NEGATIVE
Protein, ur: NEGATIVE mg/dL
Specific Gravity, Urine: >1.03 — ABNORMAL HIGH (ref 1.005–1.030)
pH: 5.5 (ref 5.0–8.0)

## 2018-05-27 LAB — LIPASE, BLOOD
Lipase: 93 U/L — ABNORMAL HIGH (ref 11–51)
Lipase: 56 U/L — ABNORMAL HIGH (ref 11–51)

## 2018-05-27 MED ORDER — HYDROMORPHONE HCL 1 MG/ML IJ SOLN
1.0000 mg | Freq: Once | INTRAMUSCULAR | Status: AC
  Administered 2018-05-27: 1 mg via INTRAVENOUS
  Filled 2018-05-27: qty 1

## 2018-05-27 MED ORDER — LACTATED RINGERS IV BOLUS
1000.0000 mL | Freq: Once | INTRAVENOUS | Status: AC
  Administered 2018-05-27: 1000 mL via INTRAVENOUS

## 2018-05-27 MED ORDER — LACTATED RINGERS IV BOLUS: 1000.0000 mL | Freq: Once | INTRAVENOUS | Status: DC

## 2018-05-27 MED ORDER — METOCLOPRAMIDE HCL 5 MG/ML IJ SOLN
10.0000 mg | Freq: Once | INTRAMUSCULAR | Status: AC
  Administered 2018-05-27: 10 mg via INTRAVENOUS
  Filled 2018-05-27: qty 2

## 2018-05-27 MED ORDER — OXYCODONE HCL 5 MG PO TABS: 5.0000 mg | ORAL_TABLET | ORAL | 0 refills | Status: DC | PRN

## 2018-05-27 MED ORDER — METOCLOPRAMIDE HCL 10 MG PO TABS: 10.0000 mg | ORAL_TABLET | Freq: Four times a day (QID) | ORAL | 0 refills | Status: DC | PRN

## 2018-05-27 MED ORDER — SODIUM CHLORIDE 0.9 % IV BOLUS
1000.0000 mL | Freq: Once | INTRAVENOUS | Status: AC
  Administered 2018-05-27: 1000 mL via INTRAVENOUS

## 2018-05-27 MED ORDER — ONDANSETRON HCL 4 MG/2ML IJ SOLN
4.0000 mg | Freq: Once | INTRAMUSCULAR | Status: AC | PRN
  Administered 2018-05-27: 4 mg via INTRAVENOUS
  Filled 2018-05-27: qty 2

## 2018-05-27 MED ORDER — IOPAMIDOL (ISOVUE-300) INJECTION 61%
100.0000 mL | Freq: Once | INTRAVENOUS | Status: AC | PRN
  Administered 2018-05-27: 100 mL via INTRAVENOUS

## 2018-05-27 MED ORDER — OXYCODONE-ACETAMINOPHEN 5-325 MG PO TABS
1.0000 | ORAL_TABLET | Freq: Once | ORAL | Status: AC
  Administered 2018-05-27: 1 via ORAL
  Filled 2018-05-27: qty 1

## 2018-05-27 MED ORDER — FENTANYL CITRATE (PF) 100 MCG/2ML IJ SOLN
50.0000 ug | INTRAMUSCULAR | Status: DC | PRN
  Administered 2018-05-27: 50 ug via INTRAVENOUS
  Filled 2018-05-27: qty 2

## 2018-05-27 MED ORDER — PROMETHAZINE HCL 25 MG/ML IJ SOLN
12.5000 mg | Freq: Once | INTRAMUSCULAR | Status: AC
  Administered 2018-05-27: 12.5 mg via INTRAVENOUS
  Filled 2018-05-27: qty 1

## 2018-05-27 MED ORDER — ONDANSETRON HCL 4 MG/2ML IJ SOLN
4.0000 mg | Freq: Once | INTRAMUSCULAR | Status: DC
  Filled 2018-05-27: qty 2

## 2018-05-27 NOTE — ED Notes (Signed)
Patient educated about not driving or performing other critical tasks (such as operating heavy machinery, caring for infant/toddler/child) due to sedative nature of narcotic medications received while in the ED.  Pt/caregiver verbalized understanding.   

## 2018-05-27 NOTE — ED Provider Notes (Signed)
[facility] EMERGENCY DEPARTMENT Provider Note   CSN: [id number] Arrival date & time: 05/27/18  0327     History   Chief Complaint Chief Complaint  Patient presents with  . Abdominal Pain    HPI Samuel Huffman is a 52 y.o. male.  The history is provided by the patient.  He has history of pancreatitis and comes in because of mid abdominal pain which is typical of his pancreatitis.  Pain started 2 days ago, and is now radiating to his back.  He rates pain at 9/10.  There is associated nausea and vomiting, and pain is worse when he vomits.  He does admit to drinking one beer yesterday.  He has been using ondansetron, which is not controlling his nausea.  He denies fever or chills.  He denies constipation or diarrhea.  Past Medical History:  Diagnosis Date  . Acute upper GI bleeding 08/02/2016  . Anxiety   . Blood transfusion without reported diagnosis   . Cirrhosis of liver with ascites (HCC)   . Hiatal hernia   . History of alcohol abuse   . Hypertension   . Insomnia   . Pancreatic pseudocyst   . Pancreatitis   . Portal hypertensive gastropathy (HCC)   . PTSD (post-traumatic stress disorder)   . Pulmonary nodule   . Varices, gastric     Patient Active Problem List   Diagnosis Date Noted  . Microcytic anemia 04/10/2017  . SIRS (systemic inflammatory response syndrome) (HCC) 04/10/2017  . Liver cirrhosis (HCC) 04/10/2017  . GERD (gastroesophageal reflux disease) 04/10/2017  . Cirrhosis of liver with ascites (HCC)   . S/P laparoscopy 12/21/2016  . Bleeding gastric varices   . Hyponatremia 08/04/2016  . Hypokalemia 08/04/2016  . Abnormal CT scan, stomach   . Upper GI bleed 08/02/2016  . Hematemesis 08/02/2016  . Pancreatic pseudocyst   . Pulmonary nodule   . Acute recurrent pancreatitis 10/01/2014  . AKI (acute kidney injury) (HCC) 10/01/2014  . History of alcohol abuse 10/01/2014  . Lactic acidosis 10/01/2014  . High anion gap metabolic acidosis  10/01/2014  . Insomnia 07/05/2014    Past Surgical History:  Procedure Laterality Date  . ESOPHAGOGASTRODUODENOSCOPY N/A 12/18/2016   Procedure: ESOPHAGOGASTRODUODENOSCOPY (EGD);  Surgeon: Hilarie Fredrickson, MD;  Location[facility];  Service: Endoscopy;  Laterality: N/A;  . ESOPHAGOGASTRODUODENOSCOPY (EGD) WITH PROPOFOL N/A 08/03/2016   Procedure: ESOPHAGOGASTRODUODENOSCOPY (EGD) WITH PROPOFOL;  Surgeon: Rachael Fee, MD;  Location: [facility] ENDOSCOPY;  Service: Endoscopy;  Laterality: N/A;  . EUS  08/03/2016   Procedure: UPPER ENDOSCOPIC ULTRASOUND (EUS) LINEAR;  Surgeon: Rachael Fee, MD;  Location: [facility] ENDOSCOPY;  Service: Endoscopy;;  . FEMUR SURGERY Left   . LAPAROSCOPY N/A 12/21/2016   Procedure: LAPAROSCOPY LYSIS OF ADHESIONS, EXPLORATORY LAPAROTOMY, SPLENECTOMY;  Surgeon: Avel Peace, MD;  Location: [facility] ORS;  Service: General;  Laterality: N/A;  . SPLENECTOMY          Home Medications    Prior to Admission medications   Medication Sig Start Date End Date Taking? Authorizing Provider  amoxicillin-clavulanate (AUGMENTIN) 875-125 MG tablet Take 1 tablet by mouth 2 (two) times daily. 07/29/17   Copland, Gwenlyn Found, MD  ferrous sulfate (FERROUSUL) 325 (65 FE) MG tablet Take 1 tablet (325 mg total) by mouth daily with breakfast. 04/12/17   Elgergawy, Leana Roe, MD  morphine (MSIR) 15 MG tablet Take 1 tablet (15 mg total) by mouth every 4 (four) hours as needed for severe pain. 04/09/17  Melene PlanFloyd, Dan, DO  ondansetron (ZOFRAN ODT) 4 MG disintegrating tablet 4mg  ODT q4 hours prn nausea/vomit 04/09/17   Melene PlanFloyd, Dan, DO  pantoprazole (PROTONIX) 40 MG tablet Take 1 tablet (40 mg total) by mouth daily. 12/23/17   Rachael FeeJacobs, Daniel P, MD    Family History Family History  Adopted: Yes  Problem Relation Age of Onset  . Healthy Son        x1    Social History Social History   Tobacco Use  . Smoking status: Current Every Day Smoker    Packs/day: 0.50    Types: Cigarettes  . Smokeless tobacco:  Current User    Types: Snuff  Substance Use Topics  . Alcohol use: No    Comment: prior  . Drug use: No     Allergies   Patient has no known allergies.   Review of Systems Review of Systems  All other systems reviewed and are negative.    Physical Exam Updated Vital Signs BP (!) 164/111 (BP Location: Left Arm)   Pulse (!) 108   Temp 98.2 F (36.8 C) (Oral)   Resp 18   Ht 6\' 2"  (1.88 m)   Wt 90.7 kg   SpO2 99%   BMI 25.68 kg/m   Physical Exam  Nursing note and vitals reviewed.  52 year old male, resting comfortably and in no acute distress. Vital signs are significant for elevated blood pressure and heart rate. Oxygen saturation is 99%, which is normal. Head is normocephalic and atraumatic. PERRLA, EOMI. Oropharynx is clear. Neck is nontender and supple without adenopathy or JVD. Back is nontender and there is no CVA tenderness. Lungs are clear without rales, wheezes, or rhonchi. Chest is nontender. Heart has regular rate and rhythm without murmur. Abdomen is soft, flat, with mild periumbilical tenderness.  There is no rebound or guarding.  There are no masses or hepatosplenomegaly and peristalsis is hypoactive. Extremities have no cyanosis or edema, full range of motion is present. Skin is warm and dry without rash. Neurologic: Mental status is normal, cranial nerves are intact, there are no motor or sensory deficits.  ED Treatments / Results  Labs (all labs ordered are listed, but only abnormal results are displayed) Labs Reviewed  LIPASE, BLOOD - Abnormal; Notable for the following components:      Result Value   Lipase 93 (*)    All other components within normal limits  COMPREHENSIVE METABOLIC PANEL - Abnormal; Notable for the following components:   Sodium 131 (*)    Chloride 97 (*)    Glucose, Bld 165 (*)    AST 203 (*)    ALT 89 (*)    Alkaline Phosphatase 148 (*)    All other components within normal limits  CBC WITH DIFFERENTIAL/PLATELET -  Abnormal; Notable for the following components:   WBC 12.7 (*)    RBC 4.10 (*)    Neutro Abs 10.4 (*)    All other components within normal limits  URINALYSIS, ROUTINE W REFLEX MICROSCOPIC   Procedures Procedures   Medications Ordered in ED Medications  fentaNYL (SUBLIMAZE) injection 50 mcg (50 mcg Intravenous Given 05/27/18 0352)  ondansetron (ZOFRAN) injection 4 mg (4 mg Intravenous Given 05/27/18 0352)  metoCLOPramide (REGLAN) injection 10 mg (10 mg Intravenous Given 05/27/18 0448)  sodium chloride 0.9 % bolus 1,000 mL (1,000 mLs Intravenous New Bag/Given 05/27/18 0447)  HYDROmorphone (DILAUDID) injection 1 mg (1 mg Intravenous Given 05/27/18 0450)     Initial Impression / Assessment and Plan /  ED Course  I have reviewed the triage vital signs and the nursing notes.  Pertinent lab results that were available during my care of the patient were reviewed by me and considered in my medical decision making (see chart for details).  Abdominal pain and vomiting consistent with pancreatitis.  Old records reviewed confirming hospital admission 1 year ago for episode of pancreatitis.  He also had an admission for the GI bleed.  He is given IV fluids.  Triage orders included dose of ondansetron and fentanyl.  This gave slight relief of both nausea and pain, but pain has gone back to baseline.  He is given a dose of metoclopramide and hydromorphone with significant improvement in pain and nausea.  Labs do show elevated transaminases and alkaline phosphatase.  This is certainly suspicious for recent alcohol abuse, the patient repeatedly denies other than an occasional beer.  Lipase is mildly elevated, but may still represent significant pancreatitis in setting of multiple prior episodes of pancreatitis.  At this point, no indication for abdominal imaging.  He is discharged with prescription for metoclopramide and a small number of oxycodone tablets.  Follow-up with PCP.  Advised to abstain from  alcohol.  Final Clinical Impressions(s) / ED Diagnoses   Final diagnoses:  Acute pancreatitis without infection or necrosis, unspecified pancreatitis type  Elevated liver enzymes    ED Discharge Orders         Ordered    metoCLOPramide (REGLAN) 10 MG tablet  Every 6 hours PRN     05/27/18 0545    oxyCODONE (ROXICODONE) 5 MG immediate release tablet  Every 4 hours PRN     05/27/18 0545           Dione Booze, MD 05/27/18 [phone]

## 2018-05-27 NOTE — ED Triage Notes (Signed)
Pt. Still c/o vomiting after being seen this morning here at approx. 3:30 am   And left at approx 6:30 am   Pt. Reports he has back pain and is still vomiting any time he eats or drinks anything.  Pt. Reports none of the meds he is taking is staying down.

## 2018-05-27 NOTE — ED Notes (Signed)
PT states understanding of care given, follow up care. PT has no more questions at this time. Pt will let son drive pt home at this time. PT  ambulated from ED to car with a steady gait.

## 2018-05-27 NOTE — ED Provider Notes (Signed)
[facility] EMERGENCY DEPARTMENT Provider Note   CSN: [id number] Arrival date & time: 05/27/18  1545     History   Chief Complaint Chief Complaint  Patient presents with  . Emesis    HPI Samuel Huffman is a 52 y.o. male.  The history is provided by the patient.  Emesis   This is a recurrent problem. The current episode started 12 to 24 hours ago. The problem occurs 2 to 4 times per day. The problem has not changed since onset.The emesis has an appearance of stomach contents. There has been no fever. Associated symptoms include abdominal pain and diarrhea.    Past Medical History:  Diagnosis Date  . Acute upper GI bleeding 08/02/2016  . Anxiety   . Blood transfusion without reported diagnosis   . Cirrhosis of liver with ascites (HCC)   . Hiatal hernia   . History of alcohol abuse   . Hypertension   . Insomnia   . Pancreatic pseudocyst   . Pancreatitis   . Portal hypertensive gastropathy (HCC)   . PTSD (post-traumatic stress disorder)   . Pulmonary nodule   . Varices, gastric     Patient Active Problem List   Diagnosis Date Noted  . Microcytic anemia 04/10/2017  . SIRS (systemic inflammatory response syndrome) (HCC) 04/10/2017  . Liver cirrhosis (HCC) 04/10/2017  . GERD (gastroesophageal reflux disease) 04/10/2017  . Cirrhosis of liver with ascites (HCC)   . S/P laparoscopy 12/21/2016  . Bleeding gastric varices   . Hyponatremia 08/04/2016  . Hypokalemia 08/04/2016  . Abnormal CT scan, stomach   . Upper GI bleed 08/02/2016  . Hematemesis 08/02/2016  . Pancreatic pseudocyst   . Pulmonary nodule   . Acute recurrent pancreatitis 10/01/2014  . AKI (acute kidney injury) (HCC) 10/01/2014  . History of alcohol abuse 10/01/2014  . Lactic acidosis 10/01/2014  . High anion gap metabolic acidosis 10/01/2014  . Insomnia 07/05/2014    Past Surgical History:  Procedure Laterality Date  . ESOPHAGOGASTRODUODENOSCOPY N/A 12/18/2016   Procedure:  ESOPHAGOGASTRODUODENOSCOPY (EGD);  Surgeon: Hilarie Fredrickson, MD;  Location: [facility] ENDOSCOPY;  Service: Endoscopy;  Laterality: N/A;  . ESOPHAGOGASTRODUODENOSCOPY (EGD) WITH PROPOFOL N/A 08/03/2016   Procedure: ESOPHAGOGASTRODUODENOSCOPY (EGD) WITH PROPOFOL;  Surgeon: Rachael Fee, MD;  Location: [facility] ENDOSCOPY;  Service: Endoscopy;  Laterality: N/A;  . EUS  08/03/2016   Procedure: UPPER ENDOSCOPIC ULTRASOUND (EUS) LINEAR;  Surgeon: Rachael Fee, MD;  Location: [facility] ENDOSCOPY;  Service: Endoscopy;;  . FEMUR SURGERY Left   . LAPAROSCOPY N/A 12/21/2016   Procedure: LAPAROSCOPY LYSIS OF ADHESIONS, EXPLORATORY LAPAROTOMY, SPLENECTOMY;  Surgeon: Avel Peace, MD;  Location: [facility] ORS;  Service: General;  Laterality: N/A;  . SPLENECTOMY          Home Medications    Prior to Admission medications   Medication Sig Start Date End Date Taking? Authorizing Provider  ferrous sulfate (FERROUSUL) 325 (65 FE) MG tablet Take 1 tablet (325 mg total) by mouth daily with breakfast. 04/12/17   Elgergawy, Leana Roe, MD  metoCLOPramide (REGLAN) 10 MG tablet Take 1 tablet (10 mg total) by mouth every 6 (six) hours as needed for nausea (or headache). 05/27/18   Dione Booze, MD  oxyCODONE (ROXICODONE) 5 MG immediate release tablet Take 1 tablet (5 mg total) by mouth every 4 (four) hours as needed for severe pain. 05/27/18   Dione Booze, MD  pantoprazole (PROTONIX) 40 MG tablet Take 1 tablet (40 mg total) by mouth daily. 12/23/17   Christella Hartigan,  Melton Alaraniel P, MD    Family History Family History  Adopted: Yes  Problem Relation Age of Onset  . Healthy Son        x1    Social History Social History   Tobacco Use  . Smoking status: Current Every Day Smoker    Packs/day: 0.50    Types: Cigarettes  . Smokeless tobacco: Current User    Types: Snuff  Substance Use Topics  . Alcohol use: No    Comment: prior  . Drug use: No     Allergies   Patient has no known allergies.   Review of Systems Review of Systems    Gastrointestinal: Positive for abdominal pain, diarrhea and vomiting.  All other systems reviewed and are negative.    Physical Exam Updated Vital Signs BP (!) 144/94   Pulse (!) 102   Temp 98.3 F (36.8 C)   Resp 18   Ht 6\' 2"  (1.88 m)   Wt 90.7 kg   SpO2 99%   BMI 25.68 kg/m   Physical Exam  Constitutional: He is oriented to person, place, and time. He appears well-developed and well-nourished.  HENT:  Head: Normocephalic and atraumatic.  Eyes: Conjunctivae and EOM are normal.  Neck: Normal range of motion.  Cardiovascular: Tachycardia present.  Pulmonary/Chest: Effort normal. No respiratory distress.  Abdominal: Soft. Bowel sounds are normal. He exhibits no distension. There is tenderness. There is no guarding.  Musculoskeletal: Normal range of motion. He exhibits no edema or deformity.  Neurological: He is alert and oriented to person, place, and time. No cranial nerve deficit. Coordination normal.  Skin: Skin is warm and dry.  Nursing note and vitals reviewed.    ED Treatments / Results  Labs (all labs ordered are listed, but only abnormal results are displayed) Labs Reviewed  CBC WITH DIFFERENTIAL/PLATELET - Abnormal; Notable for the following components:      Result Value   WBC 12.9 (*)    RBC 4.13 (*)    Neutro Abs 11.3 (*)    All other components within normal limits  COMPREHENSIVE METABOLIC PANEL - Abnormal; Notable for the following components:   Sodium 132 (*)    Chloride 97 (*)    Glucose, Bld 159 (*)    Total Protein 8.2 (*)    AST 108 (*)    ALT 74 (*)    Alkaline Phosphatase 148 (*)    Total Bilirubin 1.3 (*)    All other components within normal limits  LIPASE, BLOOD - Abnormal; Notable for the following components:   Lipase 56 (*)    All other components within normal limits  URINALYSIS, ROUTINE W REFLEX MICROSCOPIC - Abnormal; Notable for the following components:   Specific Gravity, Urine >1.030 (*)    Bilirubin Urine SMALL (*)     Ketones, ur >80 (*)    All other components within normal limits    EKG None  Radiology Ct Abdomen Pelvis W Contrast  Result Date: 05/27/2018 CLINICAL DATA:  Pancreatitis.  Mid abdominal pain. EXAM: CT ABDOMEN AND PELVIS WITH CONTRAST TECHNIQUE: Multidetector CT imaging of the abdomen and pelvis was performed using the standard protocol following bolus administration of intravenous contrast. CONTRAST:  100mL ISOVUE-300 IOPAMIDOL (ISOVUE-300) INJECTION 61% COMPARISON:  04/09/2017 FINDINGS: Lower chest: Lung bases are clear. No effusions. Heart is normal size. Hepatobiliary: Fatty infiltration of the liver. No focal abnormality or biliary ductal dilatation. Gallbladder unremarkable. Pancreas: Scattered pancreatic calcifications. There is haziness and stranding around the pancreas, particularly the pancreatic  head compatible with acute pancreatitis. Inflammation also surrounds and involves the duodenum. Spleen: Prior splenectomy. Soft tissue in the left upper quadrant measures 3.7 cm, likely residual splenule. This is stable since prior study. Adrenals/Urinary Tract: Small scattered hypodensities in the kidneys compatible with small cysts. No hydronephrosis. Adrenal glands and urinary bladder unremarkable. Stomach/Bowel: Normal appendix. Inflammation surrounds the 1st and 2nd portion of the duodenum with associated Blizard thickening. Findings likely secondary to adjacent pancreatitis. No evidence of bowel obstruction. Vascular/Lymphatic: No evidence of aneurysm or adenopathy. Reproductive: No visible focal abnormality. Other: No free fluid or free air. Left inguinal hernia contains fat. Musculoskeletal: No acute bony abnormality. IMPRESSION: Changes of chronic pancreatitis with scattered calcifications. Acute pancreatitis changes in the pancreatic head region. Secondary inflammation and Keeble thickening in the adjacent duodenum. Fatty infiltration of the liver. Electronically Signed   By: Charlett Nose M.D.   On:  05/27/2018 19:26    Procedures Procedures (including critical care time)  Medications Ordered in ED Medications  ondansetron (ZOFRAN) injection 4 mg (has no administration in time range)  lactated ringers bolus 1,000 mL (has no administration in time range)  lactated ringers bolus 1,000 mL (0 mLs Intravenous Stopped 05/27/18 1804)  metoCLOPramide (REGLAN) injection 10 mg (10 mg Intravenous Given 05/27/18 1657)  HYDROmorphone (DILAUDID) injection 1 mg (1 mg Intravenous Given 05/27/18 1656)  iopamidol (ISOVUE-300) 61 % injection 100 mL (100 mLs Intravenous Contrast Given 05/27/18 1848)  oxyCODONE-acetaminophen (PERCOCET/ROXICET) 5-325 MG per tablet 1 tablet (1 tablet Oral Given 05/27/18 1947)  promethazine (PHENERGAN) injection 12.5 mg (12.5 mg Intravenous Given 05/27/18 2132)  HYDROmorphone (DILAUDID) injection 1 mg (1 mg Intravenous Given 05/27/18 2132)     Initial Impression / Assessment and Plan / ED Course  I have reviewed the triage vital signs and the nursing notes.  Pertinent labs & imaging results that were available during my care of the patient were reviewed by me and considered in my medical decision making (see chart for details).  Seen here earlier today and treated for pancreatitis sent home with oral medications been able to tolerate p.o. so CT scan done which confirmed pancreatitis.  Patient with improved pain and some vomiting but wants to go home.  Offered observation admission for continued IV emetics and analgesics.  He would prefer to go home and try oral medications again.  Patient was nonseptic appearing and thus was discharged. Will return if not improving in 24 hours or earlier if not feeling better.       Final Clinical Impressions(s) / ED Diagnoses   Final diagnoses:  Acute pancreatitis, unspecified complication status, unspecified pancreatitis type    ED Discharge Orders    None       Mattias Walmsley, Barbara Cower, MD 05/27/18 2144

## 2018-05-27 NOTE — Telephone Encounter (Signed)
Pt's son Domingo DimesDylan called stating that went to ED 05/27/18 and at 0600; he says that the pt was restless at home;the pt also tried nausea med but has continued to vomit and cannot keep anything down; he also says that the pt's abdominal pain is  worse now rated 9 out of 10; recommendations made per nurse triage protocol; Dylan verbalized understanding and will take the pt to ED; will route to office for notification  Reason for Disposition . [1] SEVERE pain (e.g., excruciating) AND [2] present > 1 hour  Answer Assessment - Initial Assessment Questions 1. LOCATION: "Where does it hurt?"      Mid abdomen 2. RADIATION: "Does the pain shoot anywhere else?" (e.g., chest, back)    Yes to back 3. ONSET: "When did the pain begin?" (Minutes, hours or days ago)      hours 4. SUDDEN: "Gradual or sudden onset?"      5. PATTERN "Does the pain come and go, or is it constant?"    - If constant: "Is it getting better, staying the same, or worsening?"      (Note: Constant means the pain never goes away completely; most serious pain is constant and it progresses)     - If intermittent: "How long does it last?" "Do you have pain now?"     (Note: Intermittent means the pain goes away completely between bouts)     constant 6. SEVERITY: "How bad is the pain?"  (e.g., Scale 1-10; mild, moderate, or severe)    - MILD (1-3): doesn't interfere with normal activities, abdomen soft and not tender to touch     - MODERATE (4-7): interferes with normal activities or awakens from sleep, tender to touch     - SEVERE (8-10): excruciating pain, doubled over, unable to do any normal activities       9 out of 10 7. RECURRENT SYMPTOM: "Have you ever had this type of abdominal pain before?" If so, ask: "When was the last time?" and "What happened that time?"      Yes history of pancreatitis 8. CAUSE: "What do you think is causing the abdominal pain?"     pancreatitis 9. RELIEVING/AGGRAVATING FACTORS: "What makes it better or  worse?" (e.g., movement, antacids, bowel movement)     Vomiting makes worse 10. OTHER SYMPTOMS: "Has there been any vomiting, diarrhea, constipation, or urine problems?"       vomiting  Protocols used: ABDOMINAL PAIN - MALE-A-AH

## 2018-05-27 NOTE — Discharge Instructions (Signed)
Do not drink ANYTHING with  alcohol in it!  Return if pain or nausea are not being adequately controlled at home.

## 2018-05-27 NOTE — ED Notes (Signed)
Pt states that he just vomited. EDP notified of the same.

## 2018-05-27 NOTE — ED Triage Notes (Signed)
Pt c/o abd pain with vomiting, symptoms consistent with previous pancreatitis.  

## 2018-05-28 ENCOUNTER — Telehealth: Payer: Self-pay

## 2018-05-28 NOTE — Telephone Encounter (Signed)
Copied from CRM [id number]. Topic: General - Other >> May 28, 2018 10:28 AM Leafy Roobinson, Norma J wrote: Reason for CRM: pt is calling to let dr copland know he is going back to [facility] today due to now having a fever unable to kept food down. Pt was dx with pancreatitis

## 2018-05-28 NOTE — Telephone Encounter (Signed)
FYI

## 2018-05-28 NOTE — Telephone Encounter (Signed)
Ok, thank you

## 2018-10-06 IMAGING — DX DG CHEST 1V PORT
1 series · 1 of 1 positions shown · non-contrast
Comparison: 08/01/2016 .

CLINICAL DATA: Shortness of breath.

EXAM:
PORTABLE CHEST 1 VIEW

[chest ap]
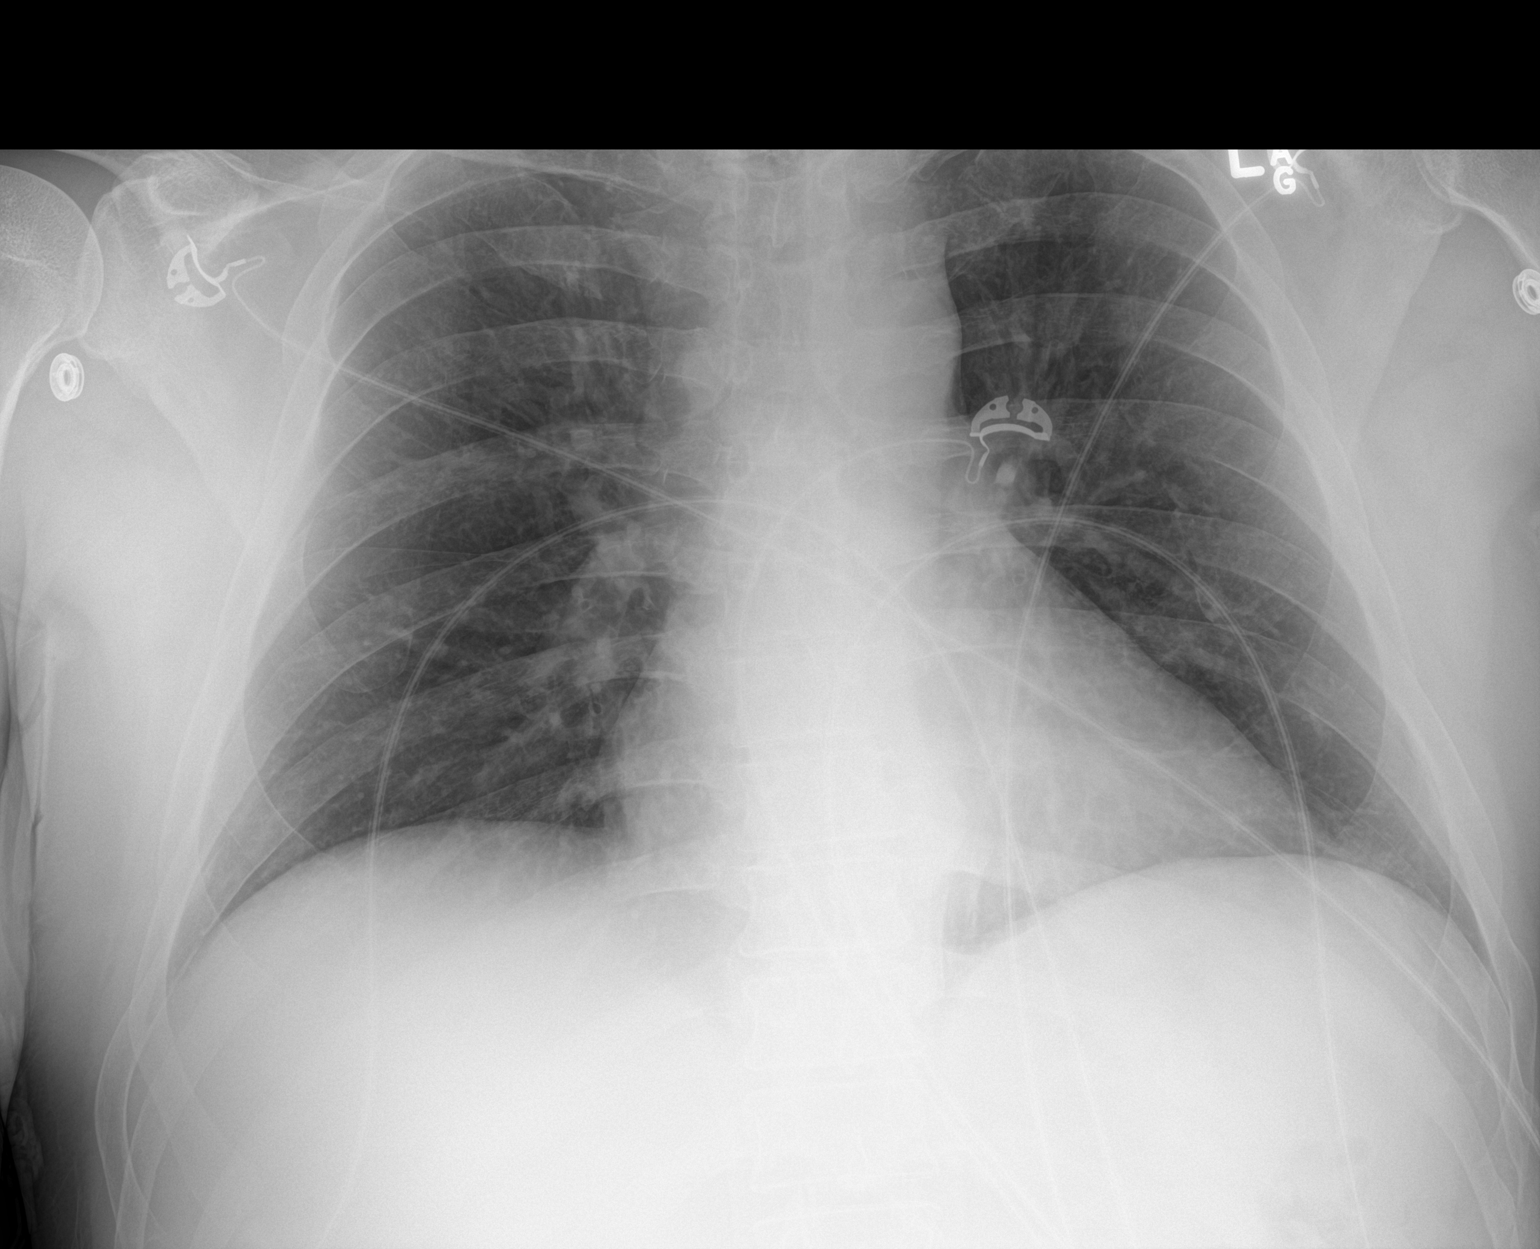

[1 of 1 positions shown; findings below may reference images not displayed]

FINDINGS: Mediastinum and hilar structures are normal. No acute pulmonary
disease . Faint nodular opacity previously noted pulmonary apex not
definitely identified, possibly related overlapping bony structures.
Reference is made to prior chest x-ray report 08/01/2016. No pleural
effusion or pneumothorax . Borderline cardiomegaly. No pulmonary
venous congestion.
IMPRESSION: 1.  No acute pulmonary disease.  Borderline cardiomegaly.

2. Previously identified tiny left apical pulmonary nodule is not
definitely identified on today's chest x-ray, this may be secondary
to overlying bony structures. Reference made to prior chest x-ray
report 08/01/2016 .

## 2018-10-06 IMAGING — CT CT ABD-PELV W/ CM
2 of 5 series · 15 of 46 positions shown, 17 images · IV contrast (agent unspecified)
Comparison: CT 08/01/2016.  Ultrasound 08/02/2016.

CLINICAL DATA: Abdominal pain with vomiting and dark stools.
History of esophageal varices, pancreatitis and questionable
cirrhosis.

EXAM:
CT ABDOMEN AND PELVIS WITH CONTRAST
TECHNIQUE: Multidetector CT imaging of the abdomen and pelvis was performed
using the standard protocol following bolus administration of
intravenous contrast.
CONTRAST:  100 ml Tsovue-166.

[Series 2: axial st · axial · 0.89mm/px · z∈[-579,-149]mm · 12 of 100 slices shown, 14 images]
[im 7/100  soft-tissue]
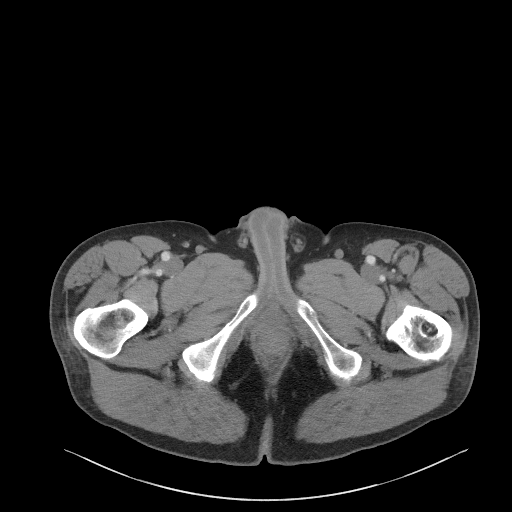
[im 7/100  bone]
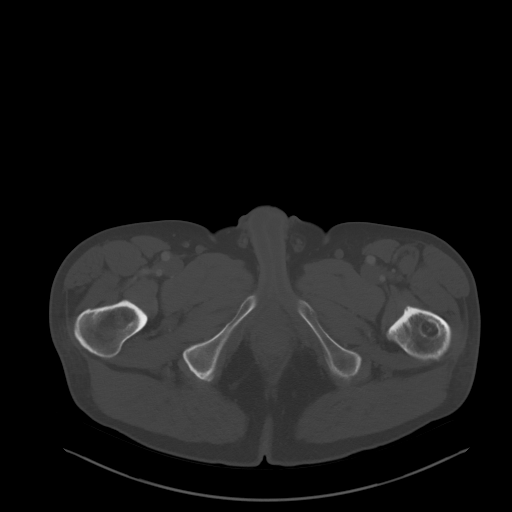
[im 14/100  soft-tissue]
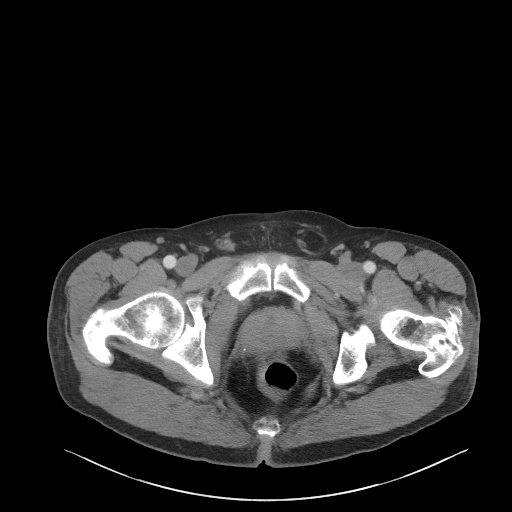
[im 20/100  soft-tissue]
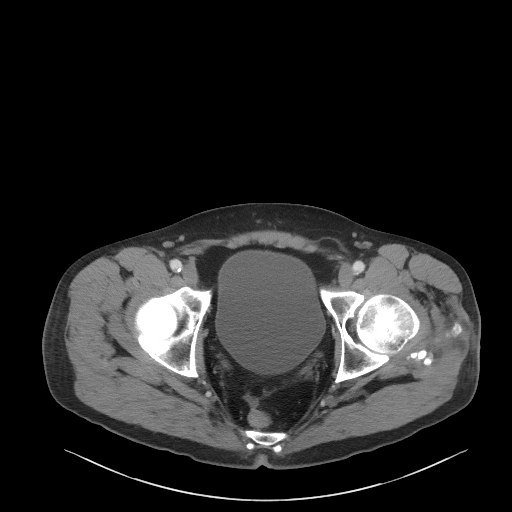
[im 34/100  soft-tissue]
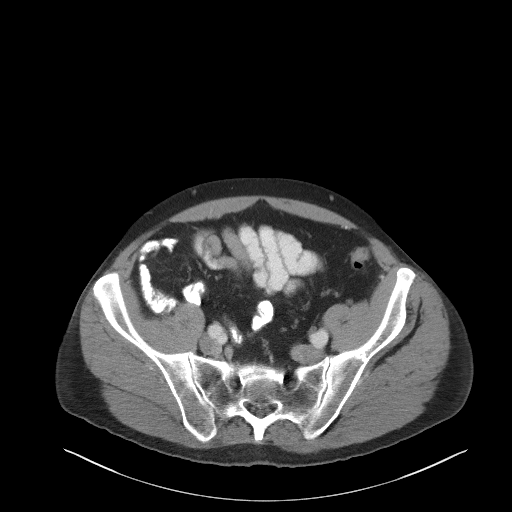
[im 40/100  soft-tissue]
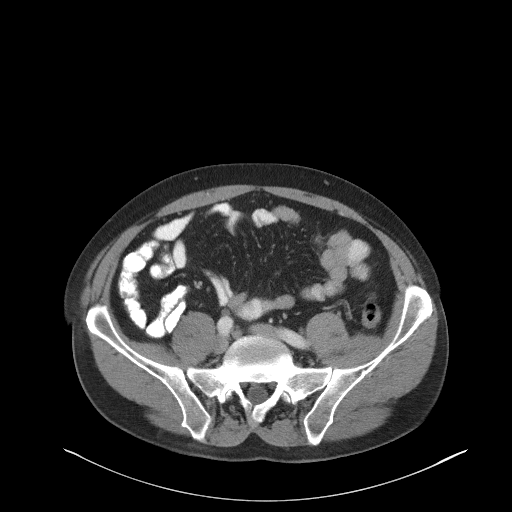
[im 47/100  soft-tissue]
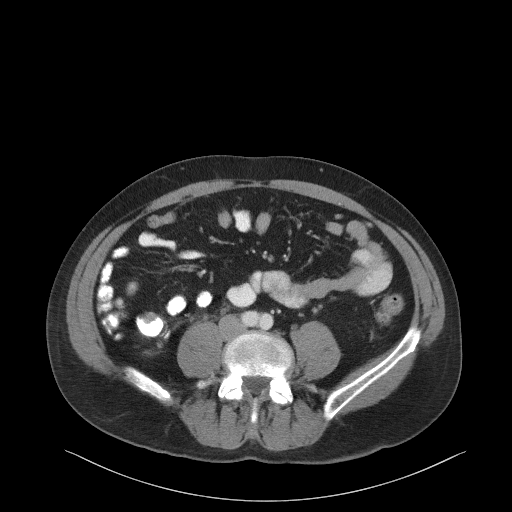
[im 53/100  soft-tissue]
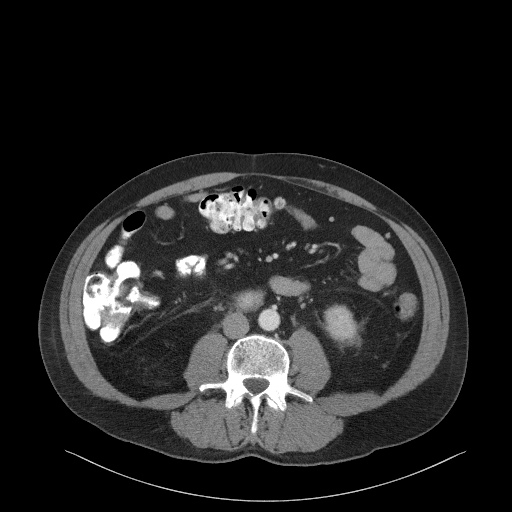
[im 60/100  soft-tissue]
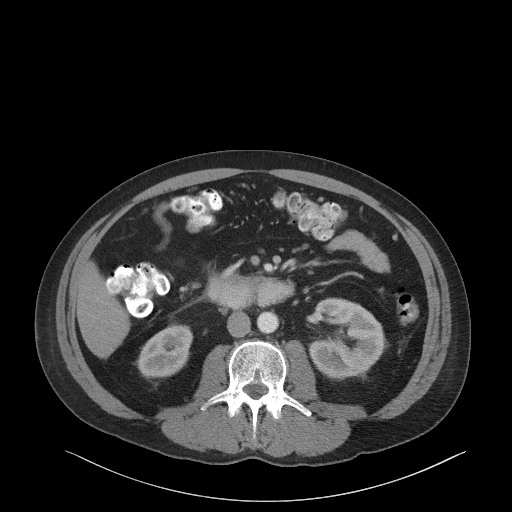
[im 67/100  soft-tissue]
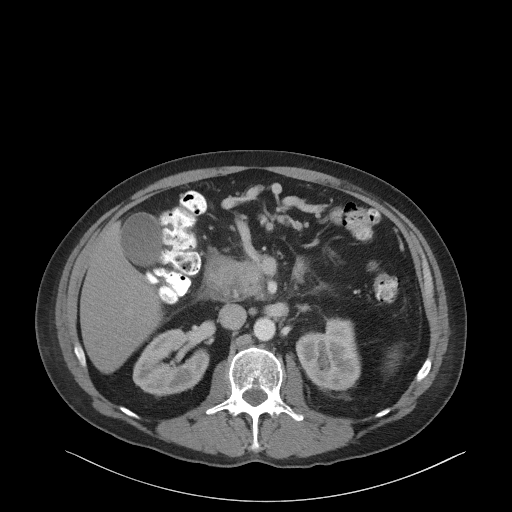
[im 67/100  bone]
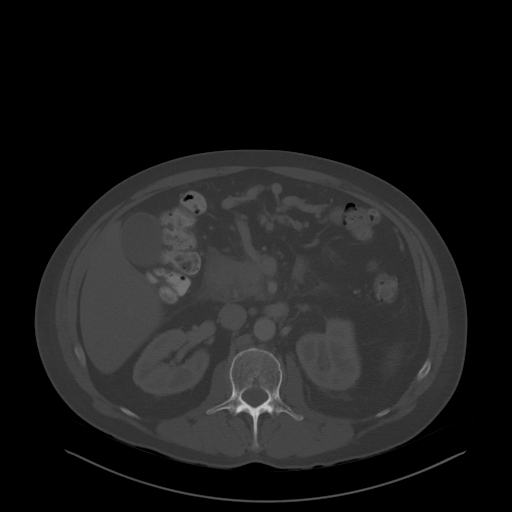
[im 80/100  soft-tissue]
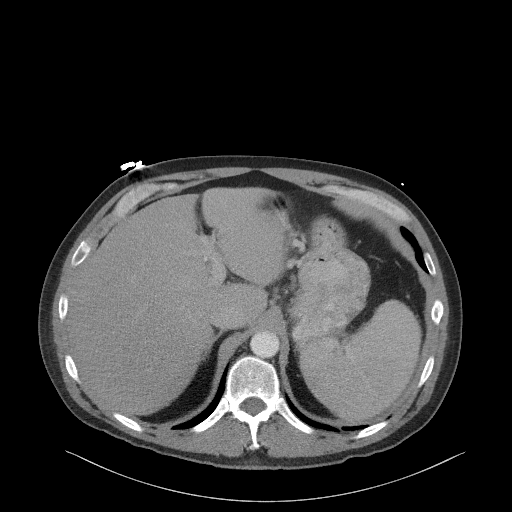
[im 86/100  soft-tissue]
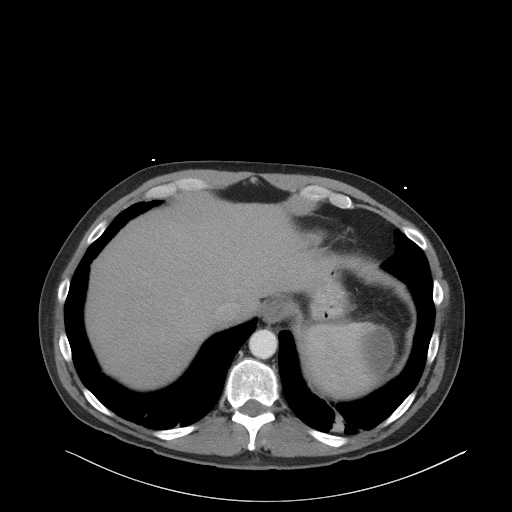
[im 93/100  soft-tissue]
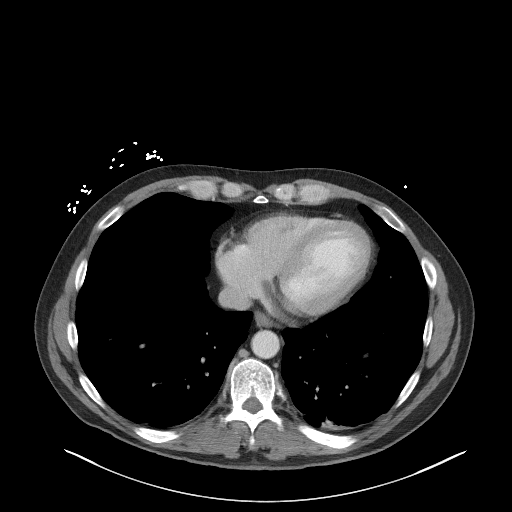

[Series 5: coronal st · coronal · 0.80mm/px · 3 of 103 slices shown]
[im 35/103  soft-tissue]
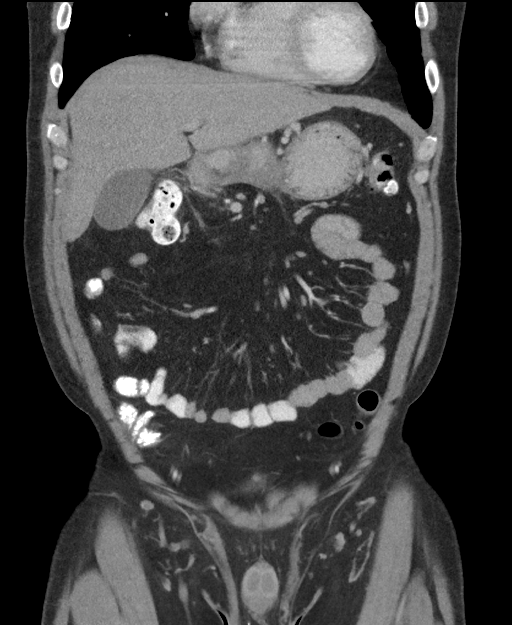
[im 46/103  soft-tissue]
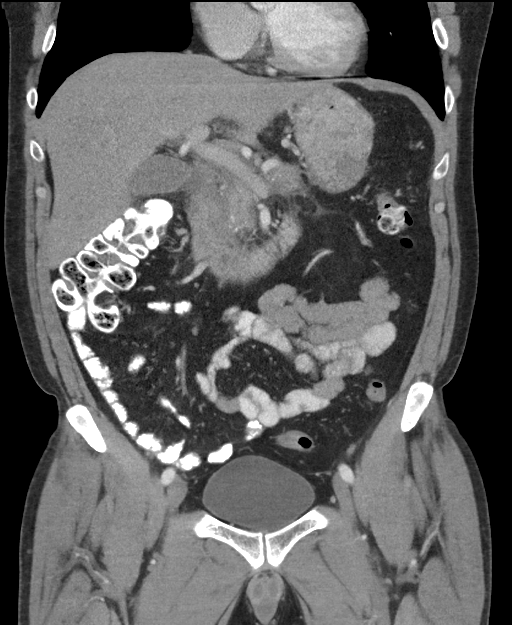
[im 57/103  soft-tissue]
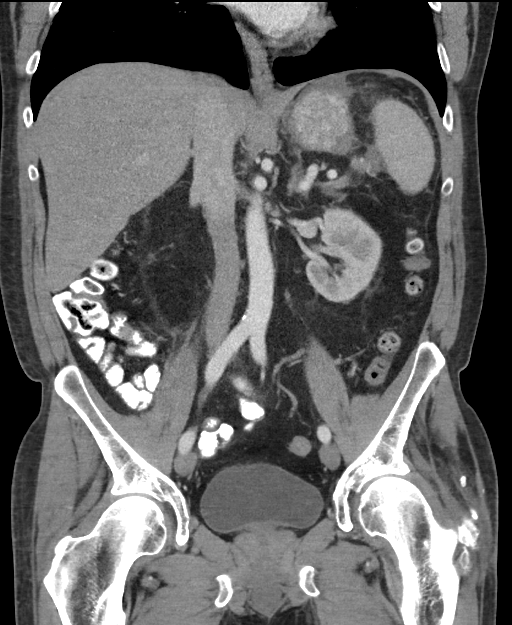

[15 of 46 positions shown; findings below may reference images not displayed]

FINDINGS: Lower chest: Linear atelectasis or scarring at both lung bases,
similar to previous study. No significant pleural or pericardial
effusion.

Hepatobiliary: Mild heterogeneity of the parenchyma without focal
abnormality. No evidence of gallstones, gallbladder wall thickening
or biliary dilatation.

Pancreas: Changes of chronic calcific pancreatitis are again noted
with pancreatic atrophy. No pancreatic ductal dilatation or
enlarging peripancreatic fluid collection. Previously demonstrated
lesser sac and splenic hilum pseudocysts have decreased in size.
Residual collection along the posterior wall of the proximal stomach
measures up to 2.4 cm on sagittal image 95.

Spleen: Stable in size without focal intrinsic abnormality. There is
a subcapsular fluid collection along the superior aspect of the
spleen which is lenticular in shape, measuring 4.7 x 1.9 x 4.8 cm.

Adrenals/Urinary Tract: Both adrenal glands appear normal. No
suspicious renal findings. Probable tiny right renal cysts. No
evidence of urinary tract calculus or hydronephrosis. The bladder
appears unremarkable.

Stomach/Bowel: The previously demonstrated diffuse gastric and
proximal duodenal wall thickening has improved. There is still some
wall thickening at the gastric fundus and duodenal bulb. There is
some soft tissue stranding around the duodenal bulb. The remainder
of the small bowel, appendix and colon demonstrate no significant
findings.

Vascular/Lymphatic: There are no enlarged abdominal or pelvic lymph
nodes. Mild aortic and branch vessel atherosclerosis. There is
chronic occlusion of the splenic vein. The portal and superior
mesenteric veins are patent. There are multiple perigastric
collateral veins.

Reproductive: Stable mild enlargement of the prostate gland. The
seminal vesicles appear normal.

Other: No ascites or free air.

Musculoskeletal: No acute or significant osseous findings. Old rib
fractures on the left and postsurgical changes within the proximal
left femur are noted.
IMPRESSION: 1. No clear explanation for GI bleeding or acute findings
demonstrated.
2. Sequela of chronic pancreatitis are again demonstrated with
retroperitoneal inflammation and several fluctuating pseudocysts.
Most of the cysts are improved from the prior study, although there
is a new subcapsular one along the superior aspect of the spleen.
3. Previously demonstrated gastric and duodenal wall thickening are
improved.
4. Chronic occlusion of the splenic vein with multiple perigastric
collaterals/varices.

## 2018-11-12 ENCOUNTER — Telehealth: Payer: Self-pay

## 2018-11-12 NOTE — Telephone Encounter (Signed)
I have printed copy of patients immunization record and placed in your notes folder for review.

## 2018-11-12 NOTE — Telephone Encounter (Signed)
Copied from CRM [id number]. 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 Please call pt back ok to leave message on home number

## 2018-11-13 NOTE — Telephone Encounter (Signed)
Pt is wanting to have call back.

## 2018-11-14 NOTE — Telephone Encounter (Signed)
Went over his immun again- per my understanding he is UTD, will need to repeat pneumovax every 5-7 years and have annual flu shots.  At this time he is current

## 2018-11-14 NOTE — Telephone Encounter (Signed)
Dr. Altamease Oiler advice on what I should tell the patient?

## 2018-12-24 ENCOUNTER — Telehealth: Payer: Self-pay | Admitting: Gastroenterology

## 2018-12-24 NOTE — Telephone Encounter (Signed)
Spoke to patient. He has an appointment with Janett Billow in 2 weeks. He states he has enough medication left to get by until that appointment.

## 2018-12-24 NOTE — Telephone Encounter (Signed)
Pt requested a refill for pantoprazole.  

## 2019-01-07 ENCOUNTER — Telehealth: Payer: Self-pay

## 2019-01-07 NOTE — Telephone Encounter (Signed)
Covid-19 screening questions   Do you now or have you had a fever in the last 14 days? No  Do you have any respiratory symptoms of shortness of breath or cough now or in the last 14 days? No  Do you have any family members or close contacts with diagnosed or suspected Covid-19 in the past 14 days? no  Have you been tested for Covid-19 and found to be positive? No        

## 2019-01-08 ENCOUNTER — Other Ambulatory Visit: Payer: Self-pay | Admitting: Gastroenterology

## 2019-01-09 ENCOUNTER — Other Ambulatory Visit: Payer: Self-pay

## 2019-01-09 ENCOUNTER — Encounter: Payer: Self-pay | Admitting: Gastroenterology

## 2019-01-09 ENCOUNTER — Ambulatory Visit: Payer: BC Managed Care – PPO | Admitting: Gastroenterology

## 2019-01-09 VITALS — BP 134/90 | HR 116 | Temp 98.1°F | Ht 73.0 in | Wt 209.5 lb

## 2019-01-09 DIAGNOSIS — K219 Gastro-esophageal reflux disease without esophagitis: Secondary | ICD-10-CM | POA: Diagnosis not present

## 2019-01-09 MED ORDER — PANTOPRAZOLE SODIUM 40 MG PO TBEC: 40.0000 mg | DELAYED_RELEASE_TABLET | Freq: Every day | ORAL | 3 refills | Status: DC

## 2019-01-09 NOTE — Patient Instructions (Signed)
We have sent the following medications to your pharmacy for you to pick up at your convenience:pantoprazole.  

## 2019-01-09 NOTE — Progress Notes (Signed)
I agree with the above note, plan 

## 2019-01-09 NOTE — Progress Notes (Signed)
01/09/2019 Samuel OliveRichard T Sramek [id number] [birth year]  Review of pertinent gastrointestinal problems: 1. 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  [facility] shows fluid collection within [place] of stomach 6cm that is almost certainly psuedocyst.  Admit [facility] 2018 the gastric Bayless fluid collection was 2.5cm.    +gastric varices (CT and EGD), doubtful underlying cirrhosis (normal Plts, INR, LFTs as of 2018 labs)  EGD 07/2016 Dr. Christella HartiganJacobs: 2.5cm intrawall cyst (see above), also gastric varices and mild portal gastropathy changes.  CT 07/2016: pseudocyst panc tail, gastric Warrior, also abrupt narrowing sigmoid (spasm?)   2.  Colonoscopy by Dr. Christella HartiganJacobs 11/2016 with only small internal hemorrhoids.   HISTORY OF PRESENT ILLNESS: 53 year old male is a patient of Dr. Christella HartiganJacobs.  Has history of chronic pancreatitis.  Had previous SMV thrombosis and underwent splenectomy in 11/2016.  Has been doing well since that time.  Gets occasional epigastric pain, mild episodes of acute pancreatitis but usually this resolves in a couple of days.  Last episode was 05/2018.  Otherwise he has no abdominal pain.  Eats as he pleases.  No ETOH.  Is here today for pantoprazole refill.  No heartburn or reflux.    Past Medical History:  Diagnosis Date  . Acute upper GI bleeding 08/02/2016  . Anxiety   . Blood transfusion without reported diagnosis   . Cirrhosis of liver with ascites (HCC)   . Hiatal hernia   . History of alcohol abuse   . Hypertension   . Insomnia   . Pancreatic pseudocyst   . Pancreatitis   . Portal hypertensive gastropathy (HCC)   . PTSD (post-traumatic stress disorder)   . Pulmonary nodule   . Varices, gastric    Past Surgical History:  Procedure Laterality Date  . ESOPHAGOGASTRODUODENOSCOPY N/A 12/18/2016   Procedure: ESOPHAGOGASTRODUODENOSCOPY (EGD);  Surgeon: Hilarie FredricksonPerry, John N, MD;  [facility];  Service:  Endoscopy;  Laterality: N/A;  . ESOPHAGOGASTRODUODENOSCOPY (EGD) WITH PROPOFOL N/A 08/03/2016   Procedure: ESOPHAGOGASTRODUODENOSCOPY (EGD) WITH PROPOFOL;  Surgeon: Rachael Feeaniel P Jacobs, MD;  [facility];  Service: Endoscopy;  Laterality: N/A;  . EUS  08/03/2016   Procedure: UPPER ENDOSCOPIC ULTRASOUND (EUS) LINEAR;  Surgeon: Rachael Feeaniel P Jacobs, MD;  [facility];  Service: Endoscopy;;  . FEMUR SURGERY Left   . LAPAROSCOPY N/A 12/21/2016   Procedure: LAPAROSCOPY LYSIS OF ADHESIONS, EXPLORATORY LAPAROTOMY, SPLENECTOMY;  Surgeon: Avel Peaceosenbower, Todd, MD;  Location: [facility]S;  Service: General;  Laterality: N/A;  . SPLENECTOMY      reports that he has been smoking cigarettes. He has been smoking about 0.50 packs per day. His smokeless tobacco use includes snuff. He reports that he does not drink alcohol or use drugs. family history includes Healthy in his son. He was adopted. No Known Allergies    Outpatient Encounter Medications as of 01/09/2019  Medication Sig  . ferrous sulfate (FERROUSUL) 325 (65 FE) MG tablet Take 1 tablet (325 mg total) by mouth daily with breakfast.  . pantoprazole (PROTONIX) 40 MG tablet TAKE 1 TABLET(40 MG) BY MOUTH DAILY  . [DISCONTINUED] metoCLOPramide (REGLAN) 10 MG tablet Take 1 tablet (10 mg total) by mouth every 6 (six) hours as needed for nausea (or headache).  . [DISCONTINUED] oxyCODONE (ROXICODONE) 5 MG immediate release tablet Take 1 tablet (5 mg total) by mouth every 4 (four) hours as needed for severe pain.   No facility-administered encounter medications on file as of 01/09/2019.  REVIEW OF SYSTEMS  : All other systems reviewed and negative except where noted in the History of Present Illness.   PHYSICAL EXAM: BP 134/90 (BP Location: Left Arm, Patient Position: Sitting, Cuff Size: Normal)   Pulse (!) 116   Temp 98.1 F (36.7 C)   Ht 6\' 1"  (1.854 m)   Wt 209 lb 8 oz (95 kg)   BMI 27.64 kg/m  General: Well developed white male in no acute  distress Head: Normocephalic and atraumatic Eyes:  Sclerae anicteric, conjunctiva pink. Ears: Normal auditory acuity Lungs: Clear throughout to auscultation; no increased WOB. Heart: Tachy; no M/R/G. Abdomen: Soft, non-distended.  BS present.  Non-tender. Musculoskeletal: Symmetrical with no gross deformities  Skin: No lesions on visible extremities Extremities: No edema  Neurological: Alert oriented x 4, grossly non-focal Psychological:  Alert and cooperative. Normal mood and affect  ASSESSMENT AND PLAN: *GERD:  Well controlled on pantoprazole 40 mg daily.  Will continue.  Prescription sent.  If he continues to do well then he can always follow with his PCP for refills of this medication in the future and just return to see [place] as needed for any other issues.  CC:  Copland, Gay Filler, MD

## 2019-02-09 ENCOUNTER — Other Ambulatory Visit: Payer: Self-pay | Admitting: Gastroenterology

## 2019-04-27 ENCOUNTER — Telehealth: Payer: Self-pay

## 2019-04-27 NOTE — Telephone Encounter (Signed)
Copied from [place] [phone]. 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 Pt is going to get flu shot today

## 2019-04-27 NOTE — Telephone Encounter (Signed)
  Please give him a call-  For splenectomy patients annual flu shot and all routine immunizations are recommended He has had all this extra shots needed for splenectomy patients already He can have the shingles series at his convenience He also may need a tetanus if more than 10 years have passed since last dose  Thank you!

## 2019-04-28 NOTE — Telephone Encounter (Signed)
Left detailed message regarding what patient will or might need.

## 2019-07-20 ENCOUNTER — Telehealth: Payer: Self-pay | Admitting: Family Medicine

## 2019-07-20 NOTE — Telephone Encounter (Signed)
Patient states that he needs Antibiotic Rx.   Pharm: [facility] in [address] [place]

## 2019-07-20 NOTE — Telephone Encounter (Signed)
What is patient needing an antibiotic for? Patient needs appointment if he is wanting anything such as an abx.

## 2019-07-21 ENCOUNTER — Telehealth: Payer: Self-pay

## 2019-07-21 ENCOUNTER — Other Ambulatory Visit: Payer: Self-pay

## 2019-07-21 DIAGNOSIS — Z9081 Acquired absence of spleen: Secondary | ICD-10-CM

## 2019-07-21 MED ORDER — AMOXICILLIN-POT CLAVULANATE 875-125 MG PO TABS: 1.0000 | ORAL_TABLET | Freq: Two times a day (BID) | ORAL | 0 refills | Status: DC

## 2019-07-21 NOTE — Telephone Encounter (Signed)
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 Medication refilled sent to pharmacy.

## 2019-07-21 NOTE — Telephone Encounter (Signed)
Patient needs a refill on his antibiotics (Omoxcycillan) and would like someone to follow up with him as soon as possible at [phone] thanks.

## 2019-10-09 ENCOUNTER — Telehealth: Payer: Self-pay | Admitting: *Deleted

## 2019-10-09 DIAGNOSIS — Z9081 Acquired absence of spleen: Secondary | ICD-10-CM

## 2019-10-09 MED ORDER — AMOXICILLIN-POT CLAVULANATE 875-125 MG PO TABS: 1.0000 | ORAL_TABLET | Freq: Two times a day (BID) | ORAL | 0 refills | Status: DC

## 2019-10-09 NOTE — Telephone Encounter (Signed)
Received request from [facility] for amox-clav 875mg . 1 tablet twice a day w/food for 10 days.  Spoke with pt regarding request. 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  Pt states the pollen has been irritating his allergies (has itchy, watery eyes) and he needs refill in case symptoms worsen or turn into a sinus infection. Please advise in PCP absence?

## 2019-10-09 NOTE — Telephone Encounter (Signed)
Notified pt. 

## 2019-10-09 NOTE — Telephone Encounter (Signed)
Refill has been sent.  °

## 2020-02-03 ENCOUNTER — Other Ambulatory Visit: Payer: Self-pay | Admitting: Gastroenterology

## 2020-05-04 ENCOUNTER — Other Ambulatory Visit: Payer: Self-pay | Admitting: Gastroenterology

## 2020-05-07 ENCOUNTER — Other Ambulatory Visit: Payer: Self-pay | Admitting: Gastroenterology

## 2020-05-27 ENCOUNTER — Encounter (HOSPITAL_BASED_OUTPATIENT_CLINIC_OR_DEPARTMENT_OTHER): Payer: Self-pay | Admitting: Emergency Medicine

## 2020-05-27 ENCOUNTER — Emergency Department (HOSPITAL_BASED_OUTPATIENT_CLINIC_OR_DEPARTMENT_OTHER)
Admission: EM | Admit: 2020-05-27 | Discharge: 2020-05-27 | Disposition: A | Discharge status: Home / Self Care | Payer: BC Managed Care – PPO | Source: Home / Self Care | Attending: Emergency Medicine | Admitting: Emergency Medicine

## 2020-05-27 ENCOUNTER — Other Ambulatory Visit (HOSPITAL_BASED_OUTPATIENT_CLINIC_OR_DEPARTMENT_OTHER): Payer: Self-pay | Admitting: Emergency Medicine

## 2020-05-27 ENCOUNTER — Inpatient Hospital Stay (HOSPITAL_BASED_OUTPATIENT_CLINIC_OR_DEPARTMENT_OTHER)
Admission: EM | Admit: 2020-05-27 | Discharge: 2020-05-30 | DRG: 438 | Disposition: A | Discharge status: Home / Self Care | Payer: BC Managed Care – PPO | Attending: Internal Medicine | Admitting: Internal Medicine

## 2020-05-27 ENCOUNTER — Encounter (HOSPITAL_BASED_OUTPATIENT_CLINIC_OR_DEPARTMENT_OTHER): Payer: Self-pay

## 2020-05-27 ENCOUNTER — Other Ambulatory Visit: Payer: Self-pay

## 2020-05-27 ENCOUNTER — Emergency Department (HOSPITAL_BASED_OUTPATIENT_CLINIC_OR_DEPARTMENT_OTHER): Payer: BC Managed Care – PPO

## 2020-05-27 DIAGNOSIS — E8729 Other acidosis: Secondary | ICD-10-CM | POA: Diagnosis present

## 2020-05-27 DIAGNOSIS — F1011 Alcohol abuse, in remission: Secondary | ICD-10-CM | POA: Diagnosis present

## 2020-05-27 DIAGNOSIS — Z23 Encounter for immunization: Secondary | ICD-10-CM

## 2020-05-27 DIAGNOSIS — K7460 Unspecified cirrhosis of liver: Secondary | ICD-10-CM | POA: Diagnosis present

## 2020-05-27 DIAGNOSIS — R109 Unspecified abdominal pain: Secondary | ICD-10-CM | POA: Diagnosis not present

## 2020-05-27 DIAGNOSIS — K859 Acute pancreatitis without necrosis or infection, unspecified: Secondary | ICD-10-CM | POA: Diagnosis not present

## 2020-05-27 DIAGNOSIS — E1110 Type 2 diabetes mellitus with ketoacidosis without coma: Secondary | ICD-10-CM | POA: Diagnosis not present

## 2020-05-27 DIAGNOSIS — F17210 Nicotine dependence, cigarettes, uncomplicated: Secondary | ICD-10-CM | POA: Diagnosis not present

## 2020-05-27 DIAGNOSIS — E0810 Diabetes mellitus due to underlying condition with ketoacidosis without coma: Secondary | ICD-10-CM

## 2020-05-27 DIAGNOSIS — Z9081 Acquired absence of spleen: Secondary | ICD-10-CM | POA: Diagnosis not present

## 2020-05-27 DIAGNOSIS — R112 Nausea with vomiting, unspecified: Secondary | ICD-10-CM | POA: Diagnosis not present

## 2020-05-27 DIAGNOSIS — I10 Essential (primary) hypertension: Secondary | ICD-10-CM | POA: Diagnosis present

## 2020-05-27 DIAGNOSIS — K7030 Alcoholic cirrhosis of liver without ascites: Secondary | ICD-10-CM | POA: Diagnosis not present

## 2020-05-27 DIAGNOSIS — Z79899 Other long term (current) drug therapy: Secondary | ICD-10-CM

## 2020-05-27 DIAGNOSIS — E876 Hypokalemia: Secondary | ICD-10-CM | POA: Diagnosis present

## 2020-05-27 DIAGNOSIS — Z20822 Contact with and (suspected) exposure to covid-19: Secondary | ICD-10-CM | POA: Diagnosis present

## 2020-05-27 DIAGNOSIS — K861 Other chronic pancreatitis: Secondary | ICD-10-CM | POA: Diagnosis present

## 2020-05-27 DIAGNOSIS — R9431 Abnormal electrocardiogram [ECG] [EKG]: Secondary | ICD-10-CM | POA: Diagnosis not present

## 2020-05-27 DIAGNOSIS — E871 Hypo-osmolality and hyponatremia: Secondary | ICD-10-CM | POA: Diagnosis not present

## 2020-05-27 DIAGNOSIS — K860 Alcohol-induced chronic pancreatitis: Secondary | ICD-10-CM | POA: Diagnosis not present

## 2020-05-27 DIAGNOSIS — E119 Type 2 diabetes mellitus without complications: Secondary | ICD-10-CM

## 2020-05-27 DIAGNOSIS — K766 Portal hypertension: Secondary | ICD-10-CM | POA: Diagnosis present

## 2020-05-27 DIAGNOSIS — K219 Gastro-esophageal reflux disease without esophagitis: Secondary | ICD-10-CM | POA: Insufficient documentation

## 2020-05-27 DIAGNOSIS — K852 Alcohol induced acute pancreatitis without necrosis or infection: Secondary | ICD-10-CM | POA: Diagnosis not present

## 2020-05-27 DIAGNOSIS — E872 Acidosis: Secondary | ICD-10-CM | POA: Diagnosis present

## 2020-05-27 LAB — CBC WITH DIFFERENTIAL/PLATELET
Abs Immature Granulocytes: 0.06 10*3/uL (ref 0.00–0.07)
Abs Immature Granulocytes: 0.1 10*3/uL — ABNORMAL HIGH (ref 0.00–0.07)
Basophils Absolute: 0.1 10*3/uL (ref 0.0–0.1)
Basophils Absolute: 0.1 10*3/uL (ref 0.0–0.1)
Basophils Relative: 1 %
Basophils Relative: 0 %
Eosinophils Absolute: 0.1 10*3/uL (ref 0.0–0.5)
Eosinophils Absolute: 0 10*3/uL (ref 0.0–0.5)
Eosinophils Relative: 1 %
Eosinophils Relative: 0 %
HCT: 42.8 % (ref 39.0–52.0)
HCT: 39.4 % (ref 39.0–52.0)
Hemoglobin: 15 g/dL (ref 13.0–17.0)
Hemoglobin: 13.7 g/dL (ref 13.0–17.0)
Immature Granulocytes: 1 %
Immature Granulocytes: 1 %
Lymphocytes Relative: 10 %
Lymphocytes Relative: 4 %
Lymphs Abs: 1.4 10*3/uL (ref 0.7–4.0)
Lymphs Abs: 0.7 10*3/uL (ref 0.7–4.0)
MCH: 34.2 pg — ABNORMAL HIGH (ref 26.0–34.0)
MCH: 34.3 pg — ABNORMAL HIGH (ref 26.0–34.0)
MCHC: 35 g/dL (ref 30.0–36.0)
MCHC: 34.8 g/dL (ref 30.0–36.0)
MCV: 97.7 fL (ref 80.0–100.0)
MCV: 98.7 fL (ref 80.0–100.0)
Monocytes Absolute: 1 10*3/uL (ref 0.1–1.0)
Monocytes Absolute: 0.9 10*3/uL (ref 0.1–1.0)
Monocytes Relative: 8 %
Monocytes Relative: 6 %
Neutro Abs: 10.4 10*3/uL — ABNORMAL HIGH (ref 1.7–7.7)
Neutro Abs: 14.6 10*3/uL — ABNORMAL HIGH (ref 1.7–7.7)
Neutrophils Relative %: 79 %
Neutrophils Relative %: 89 %
Platelets: 269 10*3/uL (ref 150–400)
Platelets: 252 10*3/uL (ref 150–400)
RBC: 4.38 MIL/uL (ref 4.22–5.81)
RBC: 3.99 MIL/uL — ABNORMAL LOW (ref 4.22–5.81)
RDW: 11.9 % (ref 11.5–15.5)
RDW: 11.9 % (ref 11.5–15.5)
WBC: 13 10*3/uL — ABNORMAL HIGH (ref 4.0–10.5)
WBC: 16.4 10*3/uL — ABNORMAL HIGH (ref 4.0–10.5)
nRBC: 0 % (ref 0.0–0.2)
nRBC: 0 % (ref 0.0–0.2)

## 2020-05-27 LAB — COMPREHENSIVE METABOLIC PANEL
ALT: 42 U/L (ref 0–44)
ALT: 33 U/L (ref 0–44)
AST: 56 U/L — ABNORMAL HIGH (ref 15–41)
AST: 38 U/L (ref 15–41)
Albumin: 4.4 g/dL (ref 3.5–5.0)
Albumin: 4.1 g/dL (ref 3.5–5.0)
Alkaline Phosphatase: 90 U/L (ref 38–126)
Alkaline Phosphatase: 91 U/L (ref 38–126)
Anion gap: 16 — ABNORMAL HIGH (ref 5–15)
Anion gap: 20 — ABNORMAL HIGH (ref 5–15)
BUN: 14 mg/dL (ref 6–20)
BUN: 10 mg/dL (ref 6–20)
CO2: 21 mmol/L — ABNORMAL LOW (ref 22–32)
CO2: 14 mmol/L — ABNORMAL LOW (ref 22–32)
Calcium: 9.1 mg/dL (ref 8.9–10.3)
Calcium: 8.8 mg/dL — ABNORMAL LOW (ref 8.9–10.3)
Chloride: 89 mmol/L — ABNORMAL LOW (ref 98–111)
Chloride: 94 mmol/L — ABNORMAL LOW (ref 98–111)
Creatinine, Ser: 0.67 mg/dL (ref 0.61–1.24)
Creatinine, Ser: 0.75 mg/dL (ref 0.61–1.24)
GFR, Estimated: >60 mL/min (ref >60)
GFR, Estimated: >60 mL/min (ref >60)
Glucose, Bld: 334 mg/dL — ABNORMAL HIGH (ref 70–99)
Glucose, Bld: 285 mg/dL — ABNORMAL HIGH (ref 70–99)
Potassium: 3.3 mmol/L — ABNORMAL LOW (ref 3.5–5.1)
Potassium: 3.7 mmol/L (ref 3.5–5.1)
Sodium: 126 mmol/L — ABNORMAL LOW (ref 135–145)
Sodium: 128 mmol/L — ABNORMAL LOW (ref 135–145)
Total Bilirubin: 1.9 mg/dL — ABNORMAL HIGH (ref 0.3–1.2)
Total Bilirubin: 2.1 mg/dL — ABNORMAL HIGH (ref 0.3–1.2)
Total Protein: 7.9 g/dL (ref 6.5–8.1)
Total Protein: 7.7 g/dL (ref 6.5–8.1)

## 2020-05-27 LAB — URINALYSIS, ROUTINE W REFLEX MICROSCOPIC
Bilirubin Urine: NEGATIVE
Glucose, UA: >=500 mg/dL — AB
Ketones, ur: >80 mg/dL — AB
Leukocytes,Ua: NEGATIVE
Nitrite: NEGATIVE
Protein, ur: NEGATIVE mg/dL
Specific Gravity, Urine: 1.015 (ref 1.005–1.030)
pH: 6 (ref 5.0–8.0)

## 2020-05-27 LAB — BASIC METABOLIC PANEL
Anion gap: 11 (ref 5–15)
BUN: 11 mg/dL (ref 6–20)
CO2: 20 mmol/L — ABNORMAL LOW (ref 22–32)
Calcium: 7.8 mg/dL — ABNORMAL LOW (ref 8.9–10.3)
Chloride: 97 mmol/L — ABNORMAL LOW (ref 98–111)
Creatinine, Ser: 0.55 mg/dL — ABNORMAL LOW (ref 0.61–1.24)
GFR, Estimated: >60 mL/min (ref >60)
Glucose, Bld: 252 mg/dL — ABNORMAL HIGH (ref 70–99)
Potassium: 3.3 mmol/L — ABNORMAL LOW (ref 3.5–5.1)
Sodium: 128 mmol/L — ABNORMAL LOW (ref 135–145)

## 2020-05-27 LAB — RESP PANEL BY RT-PCR (FLU A&B, COVID) ARPGX2
Influenza A by PCR: NEGATIVE
Influenza B by PCR: NEGATIVE
SARS Coronavirus 2 by RT PCR: NEGATIVE

## 2020-05-27 LAB — URINALYSIS, MICROSCOPIC (REFLEX)

## 2020-05-27 LAB — LIPASE, BLOOD
Lipase: 55 U/L — ABNORMAL HIGH (ref 11–51)
Lipase: 50 U/L (ref 11–51)

## 2020-05-27 MED ORDER — SODIUM CHLORIDE 0.9 % IV BOLUS
2000.0000 mL | Freq: Once | INTRAVENOUS | Status: AC
  Administered 2020-05-27: 2000 mL via INTRAVENOUS

## 2020-05-27 MED ORDER — HYDROMORPHONE HCL 1 MG/ML IJ SOLN
1.0000 mg | Freq: Once | INTRAMUSCULAR | Status: AC
  Administered 2020-05-27: 1 mg via INTRAVENOUS
  Filled 2020-05-27: qty 1

## 2020-05-27 MED ORDER — ONDANSETRON 4 MG PO TBDP: 4.0000 mg | ORAL_TABLET | Freq: Three times a day (TID) | ORAL | 1 refills | Status: DC | PRN

## 2020-05-27 MED ORDER — MORPHINE SULFATE (PF) 4 MG/ML IV SOLN: 4.0000 mg | Freq: Once | INTRAVENOUS | Status: DC

## 2020-05-27 MED ORDER — SODIUM CHLORIDE 0.9 % IV SOLN: INTRAVENOUS | Status: DC

## 2020-05-27 MED ORDER — LACTATED RINGERS IV BOLUS
1000.0000 mL | Freq: Once | INTRAVENOUS | Status: AC
  Administered 2020-05-27: 1000 mL via INTRAVENOUS

## 2020-05-27 MED ORDER — ONDANSETRON HCL 4 MG/2ML IJ SOLN
INTRAMUSCULAR | Status: AC
  Administered 2020-05-27: 4 mg via INTRAVENOUS
  Filled 2020-05-27: qty 2

## 2020-05-27 MED ORDER — IOHEXOL 300 MG/ML  SOLN
100.0000 mL | Freq: Once | INTRAMUSCULAR | Status: AC | PRN
  Administered 2020-05-27: 100 mL via INTRAVENOUS

## 2020-05-27 MED ORDER — HYDROCODONE-ACETAMINOPHEN 5-325 MG PO TABS
1.0000 | ORAL_TABLET | Freq: Four times a day (QID) | ORAL | 0 refills | Status: DC | PRN
Start: 2020-05-27 — End: 2020-06-13

## 2020-05-27 MED ORDER — SODIUM CHLORIDE 0.9 % IV BOLUS: 1000.0000 mL | Freq: Once | INTRAVENOUS | Status: DC

## 2020-05-27 MED ORDER — FENTANYL CITRATE (PF) 100 MCG/2ML IJ SOLN
100.0000 ug | Freq: Once | INTRAMUSCULAR | Status: AC
  Administered 2020-05-27: 100 ug via INTRAVENOUS
  Filled 2020-05-27: qty 2

## 2020-05-27 MED ORDER — LACTATED RINGERS IV SOLN: INTRAVENOUS | Status: DC

## 2020-05-27 MED ORDER — MORPHINE SULFATE (PF) 2 MG/ML IV SOLN
2.0000 mg | INTRAVENOUS | Status: DC | PRN
  Administered 2020-05-28: 2 mg via INTRAVENOUS
  Filled 2020-05-27: qty 1

## 2020-05-27 MED ORDER — ONDANSETRON HCL 4 MG/2ML IJ SOLN
4.0000 mg | Freq: Once | INTRAMUSCULAR | Status: AC
  Administered 2020-05-27: 4 mg via INTRAVENOUS
  Filled 2020-05-27: qty 2

## 2020-05-27 MED ORDER — ONDANSETRON HCL 4 MG/2ML IJ SOLN: 4.0000 mg | Freq: Once | INTRAMUSCULAR | Status: AC

## 2020-05-27 MED FILL — HYDROCODON-APAP 5-325: 5-325 | 4 days supply | Qty: 14 | Fill #0

## 2020-05-27 NOTE — Discharge Instructions (Signed)
Take the antinausea medicine and the pain medicine as needed.  Return for any persistent vomiting.  As we discussed your electrolytes on your labs are a little marginal showed some improvement with fluids.  But if you resume with frequent vomiting there can get very abnormal and you need to return.  Make an appointment for follow-up with your doctor.

## 2020-05-27 NOTE — ED Notes (Signed)
Patient transported to CT 

## 2020-05-27 NOTE — ED Notes (Signed)
ED Provider at bedside. 

## 2020-05-27 NOTE — ED Notes (Signed)
Discussed case with EDP-orders received  

## 2020-05-27 NOTE — ED Provider Notes (Signed)
Emergency Department Provider Note   I have reviewed the triage vital signs and the nursing notes.   HISTORY  Chief Complaint Abdominal Pain   HPI TRAVIUS CROCHET is a 54 y.o. male with past medical history of chronic pancreatitis returns to the emergency department with worsening abdominal pain and vomiting at home.  Patient was seen early this morning in the ED with similar symptoms.  He had lab work and CT scan performed at that time.  Patient was ultimately discharged and has tried liquid diet and bowel rest at home but notes worsening abdominal pain and vomiting with dry heaving with any attempted PO intake.  With worsening symptoms he returns to the emergency department.  He has not had fevers or shaking chills.  No chest pain or shortness of breath.  Pain is severe and constant. No radiation of symptoms or other modifying factors.   Past Medical History:  Diagnosis Date  . Acute upper GI bleeding 08/02/2016  . Anxiety   . Blood transfusion without reported diagnosis   . Cirrhosis of liver with ascites (HCC)   . Hiatal hernia   . History of alcohol abuse   . Hypertension   . Insomnia   . Pancreatic pseudocyst   . Pancreatitis   . Portal hypertensive gastropathy (HCC)   . PTSD (post-traumatic stress disorder)   . Pulmonary nodule   . Varices, gastric     Patient Active Problem List   Diagnosis Date Noted  . Microcytic anemia 04/10/2017  . SIRS (systemic inflammatory response syndrome) (HCC) 04/10/2017  . Liver cirrhosis (HCC) 04/10/2017  . GERD (gastroesophageal reflux disease) 04/10/2017  . Cirrhosis of liver with ascites (HCC)   . S/P laparoscopy 12/21/2016  . Bleeding gastric varices   . Hyponatremia 08/04/2016  . Hypokalemia 08/04/2016  . Abnormal CT scan, stomach   . Upper GI bleed 08/02/2016  . Hematemesis 08/02/2016  . Pancreatic pseudocyst   . Pulmonary nodule   . Acute recurrent pancreatitis 10/01/2014  . AKI (acute kidney injury) (HCC) 10/01/2014  .  History of alcohol abuse 10/01/2014  . Lactic acidosis 10/01/2014  . High anion gap metabolic acidosis 10/01/2014  . Insomnia 07/05/2014    Past Surgical History:  Procedure Laterality Date  . ESOPHAGOGASTRODUODENOSCOPY N/A 12/18/2016   Procedure: ESOPHAGOGASTRODUODENOSCOPY (EGD);  Surgeon: Hilarie Fredrickson, MD;  [facility];  Service: Endoscopy;  Laterality: N/A;  . ESOPHAGOGASTRODUODENOSCOPY (EGD) WITH PROPOFOL N/A 08/03/2016   Procedure: ESOPHAGOGASTRODUODENOSCOPY (EGD) WITH PROPOFOL;  Surgeon: Rachael Fee, MD;  [facility];  Service: Endoscopy;  Laterality: N/A;  . EUS  08/03/2016   Procedure: UPPER ENDOSCOPIC ULTRASOUND (EUS) LINEAR;  Surgeon: Rachael Fee, MD;  Location[facility] ENDOSCOPY;  Service: Endoscopy;;  . FEMUR SURGERY Left   . LAPAROSCOPY N/A 12/21/2016   Procedure: LAPAROSCOPY LYSIS OF ADHESIONS, EXPLORATORY LAPAROTOMY, SPLENECTOMY;  Surgeon: Avel Peace, MD;  Location: [facility] ORS;  Service: General;  Laterality: N/A;  . SPLENECTOMY      Allergies Patient has no known allergies.  Family History  Adopted: Yes  Problem Relation Age of Onset  . Healthy Son        x1    Social History Social History   Tobacco Use  . Smoking status: Current Some Day Smoker    Packs/day: 0.50    Types: Cigarettes  . Smokeless tobacco: Current User    Types: Snuff  Vaping Use  . Vaping Use: Never used  Substance Use Topics  . Alcohol use: No  .  Drug use: No    Review of Systems  Constitutional: No fever/chills Eyes: No visual changes. ENT: No sore throat. Cardiovascular: Denies chest pain. Respiratory: Denies shortness of breath. Gastrointestinal: Positive epigastric abdominal pain. Positive nausea and vomiting.  No diarrhea.  No constipation. Genitourinary: Negative for dysuria. Musculoskeletal: Negative for back pain. Skin: Negative for rash. Neurological: Negative for headaches, focal weakness or numbness.  10-point ROS otherwise  negative.  ____________________________________________   PHYSICAL EXAM:  VITAL SIGNS: ED Triage Vitals  Enc Vitals Group     BP 05/27/20 2124 (!) 148/90     Pulse Rate 05/27/20 2124 (!) 144     Resp 05/27/20 2124 20     Temp 05/27/20 2124 99 F (37.2 C)     Temp Source 05/27/20 2124 Oral     SpO2 05/27/20 2124 98 %   Constitutional: Alert and oriented. Well appearing and in no acute distress. Eyes: Conjunctivae are normal.  Head: Atraumatic. Nose: No congestion/rhinnorhea. Mouth/Throat: Mucous membranes are dry.  Neck: No stridor.   Cardiovascular: Tachycardia. Good peripheral circulation. Grossly normal heart sounds.   Respiratory: Normal respiratory effort.  No retractions. Lungs CTAB. Gastrointestinal: Soft with diffuse tenderness worse in the epigastric region. No peritoneal findings. No distention.  Musculoskeletal: No gross deformities of extremities. Neurologic:  Normal speech and language.  Skin:  Skin is warm, dry and intact. No rash noted.   ____________________________________________   LABS (all labs ordered are listed, but only abnormal results are displayed)  Labs Reviewed  COMPREHENSIVE METABOLIC PANEL - Abnormal; Notable for the following components:      Result Value   Sodium 128 (*)    Chloride 94 (*)    CO2 14 (*)    Glucose, Bld 285 (*)    Calcium 8.8 (*)    Total Bilirubin 2.1 (*)    Anion gap 20 (*)    All other components within normal limits  CBC WITH DIFFERENTIAL/PLATELET - Abnormal; Notable for the following components:   WBC 16.4 (*)    RBC 3.99 (*)    MCH 34.3 (*)    Neutro Abs 14.6 (*)    Abs Immature Granulocytes 0.10 (*)    All other components within normal limits  RESP PANEL BY RT-PCR (FLU A&B, COVID) ARPGX2  LIPASE, BLOOD   ____________________________________________  EKG   EKG Interpretation  Date/Time:  Friday May 27 2020 21:38:53 EST Ventricular Rate:  140 PR Interval:  134 QRS Duration: 76 QT  Interval:  364 QTC Calculation: 555 R Axis:   82 Text Interpretation: Sinus tachycardia Otherwise normal ECG Prolonged QT No STEMI Confirmed by Alona Bene [phone]) on 05/27/2020 10:33:19 PM       ____________________________________________  RADIOLOGY  CT Abdomen Pelvis W Contrast  Result Date: 05/27/2020 CLINICAL DATA:  Epigastric abdominal pain. EXAM: CT ABDOMEN AND PELVIS WITH CONTRAST TECHNIQUE: Multidetector CT imaging of the abdomen and pelvis was performed using the standard protocol following bolus administration of intravenous contrast. CONTRAST:  OMNIPAQUE IOHEXOL 300 MG/ML  SOLN COMPARISON:  May 27, 2018. FINDINGS: Lower chest: No acute abnormality. Hepatobiliary: No focal liver abnormality is seen. No gallstones, gallbladder [place] thickening, or biliary dilatation. Pancreas: Calcifications are seen throughout the pancreas consistent with chronic pancreatitis. No ductal dilatation is noted. Some stranding of the surrounding tissues is noted around the pancreatic head suggesting possible acute inflammation. Spleen: Status post splenectomy. Stable probable splenule is seen in the left upper quadrant. Adrenals/Urinary Tract: Adrenal glands are unremarkable. Kidneys are normal, without renal  calculi, focal lesion, or hydronephrosis. Bladder is unremarkable. Stomach/Bowel: Stomach is within normal limits. Appendix appears normal. No evidence of bowel [place] thickening, distention, or inflammatory changes. Vascular/Lymphatic: No significant vascular findings are present. No enlarged abdominal or pelvic lymph nodes. Reproductive: Prostate is unremarkable. Other: Small fat containing periumbilical hernia is noted. No ascites is noted. Musculoskeletal: No acute or significant osseous findings. IMPRESSION: 1. Findings consistent with chronic pancreatitis. Some stranding of the surrounding tissues is noted around the pancreatic head suggesting possible acute pancreatitis. Correlation with  clinical and laboratory findings is recommended. 2. Small fat containing periumbilical hernia. 3. Status post splenectomy. Electronically Signed   By: Lupita Raider M.D.   On: 05/27/2020 08:56    ____________________________________________   PROCEDURES  Procedure(s) performed:   Procedures   ____________________________________________   INITIAL IMPRESSION / ASSESSMENT AND PLAN / ED COURSE  Pertinent labs & imaging results that were available during my care of the patient were reviewed by me and considered in my medical decision making (see chart for details).   Patient returns the emergency department with worsening abdominal pain and vomiting.  Has acute on chronic pancreatitis clinically.  CT imaging reviewed from earlier this morning showing some stranding near the head of the pancreas concerning for acute on chronic pancreatitis.  No pseudocyst or other fluid collection.  Patient is afebrile.  He arrived with severe tachycardia likely related to pain.  We will give IV fluids and pain medications.  EKG shows sinus rhythm with prolonged QT on the initial EKG but this may be affected by rate. Will hold on nausea meds for now and focus on pain mgmt and IVF.   Discussed patient's case with [facility] to request admission. Patient and family (if present) updated with plan. Care transferred to [facility] service.  I reviewed all nursing notes, vitals, pertinent old records, EKGs, labs, imaging (as available).  ____________________________________________  FINAL CLINICAL IMPRESSION(S) / ED DIAGNOSES  Final diagnoses:  Acute pancreatitis, unspecified complication status, unspecified pancreatitis type  Prolonged Q-T interval on ECG    MEDICATIONS GIVEN DURING THIS VISIT:  Medications  lactated ringers bolus 1,000 mL (has no administration in time range)  morphine 4 MG/ML injection 4 mg (has no administration in time range)    Note:  This document was prepared using Dragon voice recognition  software and may include unintentional dictation errors.  Alona Bene, MD, [facility] Emergency Medicine    Demetris Meinhardt, Arlyss Repress, MD 05/27/20 [phone]

## 2020-05-27 NOTE — ED Provider Notes (Signed)
MSE was initiated and I personally evaluated the patient and placed orders (if any) at  6:39 AM on May 27, 2020.  The patient appears stable so that the remainder of the MSE may be completed by another provider.   Patient with history of recurrent alcoholic pancreatitis from former alcohol abuse.  He is here with severe epigastric pain and intractable nausea and vomiting.  He states he is so dehydrated he is not making any saliva.  His heart rate was noted to be 136.  We will start IV fluids nausea and pain medications at this time.     Beverlee Wilmarth, Jonny Ruiz, MD 05/27/20 [phone]

## 2020-05-27 NOTE — ED Provider Notes (Signed)
[facility] EMERGENCY DEPARTMENT Provider Note   CSN: [id number] Arrival date & time: 05/27/20  0602     History Chief Complaint  Patient presents with  . Abdominal Pain    Samuel OliveRichard T Hu is a 54 y.o. male.  Patient with known history of pancreatitis.  Patient states he has had onset of the symptoms for the past few days that he thought was a flare of his pancreatitis.  But now he started with nausea and vomiting and once that starts usually cannot get it under control.  Has not been able to keep anything down.  Feels dehydrated and weak.  This been going on for a few days.  No blood in the vomit no blood in his bowel movements.  Abdominal pain is epigastric area.  Radiates to the back, around both sides.        Past Medical History:  Diagnosis Date  . Acute upper GI bleeding 08/02/2016  . Anxiety   . Blood transfusion without reported diagnosis   . Cirrhosis of liver with ascites (HCC)   . Hiatal hernia   . History of alcohol abuse   . Hypertension   . Insomnia   . Pancreatic pseudocyst   . Pancreatitis   . Portal hypertensive gastropathy (HCC)   . PTSD (post-traumatic stress disorder)   . Pulmonary nodule   . Varices, gastric     Patient Active Problem List   Diagnosis Date Noted  . Microcytic anemia 04/10/2017  . SIRS (systemic inflammatory response syndrome) (HCC) 04/10/2017  . Liver cirrhosis (HCC) 04/10/2017  . GERD (gastroesophageal reflux disease) 04/10/2017  . Cirrhosis of liver with ascites (HCC)   . S/P laparoscopy 12/21/2016  . Bleeding gastric varices   . Hyponatremia 08/04/2016  . Hypokalemia 08/04/2016  . Abnormal CT scan, stomach   . Upper GI bleed 08/02/2016  . Hematemesis 08/02/2016  . Pancreatic pseudocyst   . Pulmonary nodule   . Acute recurrent pancreatitis 10/01/2014  . AKI (acute kidney injury) (HCC) 10/01/2014  . History of alcohol abuse 10/01/2014  . Lactic acidosis 10/01/2014  . High anion gap metabolic acidosis 10/01/2014   . Insomnia 07/05/2014    Past Surgical History:  Procedure Laterality Date  . ESOPHAGOGASTRODUODENOSCOPY N/A 12/18/2016   Procedure: ESOPHAGOGASTRODUODENOSCOPY (EGD);  Surgeon: Hilarie FredricksonPerry, John N, MD;  [facility];  Service: Endoscopy;  Laterality: N/A;  . ESOPHAGOGASTRODUODENOSCOPY (EGD) WITH PROPOFOL N/A 08/03/2016   Procedure: ESOPHAGOGASTRODUODENOSCOPY (EGD) WITH PROPOFOL;  Surgeon: Rachael Feeaniel P Jacobs, MD;  [facility];  Service: Endoscopy;  Laterality: N/A;  . EUS  08/03/2016   Procedure: UPPER ENDOSCOPIC ULTRASOUND (EUS) LINEAR;  Surgeon: Rachael Feeaniel P Jacobs, MD;  [facility];  Service: Endoscopy;;  . FEMUR SURGERY Left   . LAPAROSCOPY N/A 12/21/2016   Procedure: LAPAROSCOPY LYSIS OF ADHESIONS, EXPLORATORY LAPAROTOMY, SPLENECTOMY;  Surgeon: Avel Peaceosenbower, Todd, MD;  Location: [facility]S;  Service: General;  Laterality: N/A;  . SPLENECTOMY         Family History  Adopted: Yes  Problem Relation Age of Onset  . Healthy Son        x1    Social History   Tobacco Use  . Smoking status: Current Some Day Smoker    Packs/day: 0.50    Types: Cigarettes  . Smokeless tobacco: Current User    Types: Snuff  Vaping Use  . Vaping Use: Never used  Substance Use Topics  . Alcohol use: No    Comment: prior  . Drug use: No  Home Medications Prior to Admission medications   Medication Sig Start Date End Date Taking? Authorizing Provider  amoxicillin-clavulanate (AUGMENTIN) 875-125 MG tablet Take 1 tablet by mouth 2 (two) times daily. 10/09/19   Sandford Craze, NP  ferrous sulfate (FERROUSUL) 325 (65 FE) MG tablet Take 1 tablet (325 mg total) by mouth daily with breakfast. 04/12/17   Elgergawy, Leana Roe, MD  HYDROcodone-acetaminophen (NORCO/VICODIN) 5-325 MG tablet Take 1 tablet by mouth every 6 (six) hours as needed for moderate pain. 05/27/20   Vanetta Mulders, MD  ondansetron (ZOFRAN ODT) 4 MG disintegrating tablet Take 1 tablet (4 mg total) by mouth every 8 (eight)  hours as needed. 05/27/20   Vanetta Mulders, MD  pantoprazole (PROTONIX) 40 MG tablet TAKE 1 TABLET(40 MG) BY MOUTH DAILY 02/03/20   Zehr, Princella Pellegrini, PA-C    Allergies    Patient has no known allergies.  Review of Systems   Review of Systems  Constitutional: Negative for chills and fever.  HENT: Negative for congestion, rhinorrhea and sore throat.   Eyes: Negative for visual disturbance.  Respiratory: Negative for cough and shortness of breath.   Cardiovascular: Negative for chest pain and leg swelling.  Gastrointestinal: Positive for abdominal pain, nausea and vomiting. Negative for blood in stool and diarrhea.  Genitourinary: Negative for dysuria.  Musculoskeletal: Positive for back pain. Negative for neck pain.  Skin: Negative for rash.  Neurological: Negative for dizziness, light-headedness and headaches.  Hematological: Does not bruise/bleed easily.  Psychiatric/Behavioral: Negative for confusion.    Physical Exam Updated Vital Signs BP (!) 153/109 (BP Location: Right Arm)   Pulse (!) 112   Temp 98.5 F (36.9 C) (Oral)   Resp 17   Ht 1.88 m (6\' 2" )   Wt 81.6 kg   SpO2 100%   BMI 23.11 kg/m   Physical Exam  ED Results / Procedures / Treatments   Labs (all labs ordered are listed, but only abnormal results are displayed) Labs Reviewed  LIPASE, BLOOD - Abnormal; Notable for the following components:      Result Value   Lipase 55 (*)    All other components within normal limits  COMPREHENSIVE METABOLIC PANEL - Abnormal; Notable for the following components:   Sodium 126 (*)    Potassium 3.3 (*)    Chloride 89 (*)    CO2 21 (*)    Glucose, Bld 334 (*)    AST 56 (*)    Total Bilirubin 1.9 (*)    Anion gap 16 (*)    All other components within normal limits  URINALYSIS, ROUTINE W REFLEX MICROSCOPIC - Abnormal; Notable for the following components:   Glucose, UA >=500 (*)    Hgb urine dipstick TRACE (*)    Ketones, ur >80 (*)    All other components within  normal limits  CBC WITH DIFFERENTIAL/PLATELET - Abnormal; Notable for the following components:   WBC 13.0 (*)    MCH 34.2 (*)    Neutro Abs 10.4 (*)    All other components within normal limits  URINALYSIS, MICROSCOPIC (REFLEX) - Abnormal; Notable for the following components:   Bacteria, UA MANY (*)    All other components within normal limits  BASIC METABOLIC PANEL - Abnormal; Notable for the following components:   Sodium 128 (*)    Potassium 3.3 (*)    Chloride 97 (*)    CO2 20 (*)    Glucose, Bld 252 (*)    Creatinine, Ser 0.55 (*)    Calcium 7.8 (*)  All other components within normal limits    EKG None  Radiology CT Abdomen Pelvis W Contrast  Result Date: 05/27/2020 CLINICAL DATA:  Epigastric abdominal pain. EXAM: CT ABDOMEN AND PELVIS WITH CONTRAST TECHNIQUE: Multidetector CT imaging of the abdomen and pelvis was performed using the standard protocol following bolus administration of intravenous contrast. CONTRAST:  OMNIPAQUE IOHEXOL 300 MG/ML  SOLN COMPARISON:  May 27, 2018. FINDINGS: Lower chest: No acute abnormality. Hepatobiliary: No focal liver abnormality is seen. No gallstones, gallbladder Neyman thickening, or biliary dilatation. Pancreas: Calcifications are seen throughout the pancreas consistent with chronic pancreatitis. No ductal dilatation is noted. Some stranding of the surrounding tissues is noted around the pancreatic head suggesting possible acute inflammation. Spleen: Status post splenectomy. Stable probable splenule is seen in the left upper quadrant. Adrenals/Urinary Tract: Adrenal glands are unremarkable. Kidneys are normal, without renal calculi, focal lesion, or hydronephrosis. Bladder is unremarkable. Stomach/Bowel: Stomach is within normal limits. Appendix appears normal. No evidence of bowel Barca thickening, distention, or inflammatory changes. Vascular/Lymphatic: No significant vascular findings are present. No enlarged abdominal or pelvic lymph  nodes. Reproductive: Prostate is unremarkable. Other: Small fat containing periumbilical hernia is noted. No ascites is noted. Musculoskeletal: No acute or significant osseous findings. IMPRESSION: 1. Findings consistent with chronic pancreatitis. Some stranding of the surrounding tissues is noted around the pancreatic head suggesting possible acute pancreatitis. Correlation with clinical and laboratory findings is recommended. 2. Small fat containing periumbilical hernia. 3. Status post splenectomy. Electronically Signed   By: Lupita Raider M.D.   On: 05/27/2020 08:56    Procedures Procedures (including critical care time)  Medications Ordered in ED Medications  0.9 %  sodium chloride infusion ( Intravenous New Bag/Given 05/27/20 1000)  ondansetron (ZOFRAN) injection 4 mg (4 mg Intravenous Given 05/27/20 0646)  fentaNYL (SUBLIMAZE) injection 100 mcg (100 mcg Intravenous Given 05/27/20 0646)  sodium chloride 0.9 % bolus 2,000 mL (0 mLs Intravenous Stopped 05/27/20 0900)  HYDROmorphone (DILAUDID) injection 1 mg (1 mg Intravenous Given 05/27/20 0756)  iohexol (OMNIPAQUE) 300 MG/ML solution 100 mL (100 mLs Intravenous Contrast Given 05/27/20 0820)  HYDROmorphone (DILAUDID) injection 1 mg (1 mg Intravenous Given 05/27/20 1002)    ED Course  I have reviewed the triage vital signs and the nursing notes.  Pertinent labs & imaging results that were available during my care of the patient were reviewed by me and considered in my medical decision making (see chart for details).    MDM Rules/Calculators/A&P                          Patient CT scan consistent with pancreatitis without any complicating factors.  Lipase is not significantly elevated.  The patient does have a history of chronic pancreatitis I think this was acute on chronic.  Patient's electrolytes sodium was low at 126.  Received 2 L of fluid and 100 cc an hour.  Sodium up to 128.  Vomiting now controlled.  Discussed abnormal electrolytes  with patient.  But since vomiting and pain is controlled he does not want to be admitted.  He will return if vomiting resumes.  He says has been through this before.  And that he feels that he can get through this at home.  Patient given prescriptions for antinausea medicine and pain medicine and will follow up with his primary care doctor.   Patient states he feels much better at this time   Final Clinical Impression(s) / ED Diagnoses  Final diagnoses:  Acute pancreatitis without infection or necrosis, unspecified pancreatitis type    Rx / DC Orders ED Discharge Orders         Ordered    ondansetron (ZOFRAN ODT) 4 MG disintegrating tablet  Every 8 hours PRN        05/27/20 1126    HYDROcodone-acetaminophen (NORCO/VICODIN) 5-325 MG tablet  Every 6 hours PRN        05/27/20 1126           Vanetta Mulders, MD 05/27/20 1132

## 2020-05-27 NOTE — ED Triage Notes (Signed)
Pt c/o abd pain, n/v-states he was seen earlier for same and to return if worse-NAD-steady gait

## 2020-05-27 NOTE — ED Triage Notes (Signed)
Pt states he has hx of pancreatitis  Pt states the pain started yesterday morning in his abdomen and it has gotten worse  Pt states he has had vomiting and has not been able to hold even water down   Pt states he feels dehydrated and weak

## 2020-05-28 ENCOUNTER — Encounter (HOSPITAL_COMMUNITY): Payer: Self-pay | Admitting: Family Medicine

## 2020-05-28 DIAGNOSIS — I10 Essential (primary) hypertension: Secondary | ICD-10-CM | POA: Diagnosis present

## 2020-05-28 DIAGNOSIS — E1110 Type 2 diabetes mellitus with ketoacidosis without coma: Secondary | ICD-10-CM | POA: Diagnosis present

## 2020-05-28 DIAGNOSIS — Z20822 Contact with and (suspected) exposure to covid-19: Secondary | ICD-10-CM | POA: Diagnosis present

## 2020-05-28 DIAGNOSIS — K859 Acute pancreatitis without necrosis or infection, unspecified: Secondary | ICD-10-CM

## 2020-05-28 DIAGNOSIS — E871 Hypo-osmolality and hyponatremia: Secondary | ICD-10-CM | POA: Diagnosis present

## 2020-05-28 DIAGNOSIS — K852 Alcohol induced acute pancreatitis without necrosis or infection: Secondary | ICD-10-CM | POA: Diagnosis present

## 2020-05-28 DIAGNOSIS — K861 Other chronic pancreatitis: Secondary | ICD-10-CM

## 2020-05-28 DIAGNOSIS — K7030 Alcoholic cirrhosis of liver without ascites: Secondary | ICD-10-CM | POA: Diagnosis present

## 2020-05-28 DIAGNOSIS — E876 Hypokalemia: Secondary | ICD-10-CM | POA: Diagnosis present

## 2020-05-28 DIAGNOSIS — E119 Type 2 diabetes mellitus without complications: Secondary | ICD-10-CM

## 2020-05-28 DIAGNOSIS — Z9081 Acquired absence of spleen: Secondary | ICD-10-CM | POA: Diagnosis not present

## 2020-05-28 DIAGNOSIS — F17210 Nicotine dependence, cigarettes, uncomplicated: Secondary | ICD-10-CM | POA: Diagnosis present

## 2020-05-28 DIAGNOSIS — Z79899 Other long term (current) drug therapy: Secondary | ICD-10-CM | POA: Diagnosis not present

## 2020-05-28 DIAGNOSIS — E0810 Diabetes mellitus due to underlying condition with ketoacidosis without coma: Secondary | ICD-10-CM

## 2020-05-28 DIAGNOSIS — K860 Alcohol-induced chronic pancreatitis: Secondary | ICD-10-CM | POA: Diagnosis present

## 2020-05-28 DIAGNOSIS — K766 Portal hypertension: Secondary | ICD-10-CM | POA: Diagnosis present

## 2020-05-28 DIAGNOSIS — R112 Nausea with vomiting, unspecified: Secondary | ICD-10-CM | POA: Diagnosis present

## 2020-05-28 DIAGNOSIS — Z23 Encounter for immunization: Secondary | ICD-10-CM | POA: Diagnosis not present

## 2020-05-28 LAB — BASIC METABOLIC PANEL
Anion gap: 18 — ABNORMAL HIGH (ref 5–15)
Anion gap: 10 (ref 5–15)
Anion gap: 7 (ref 5–15)
Anion gap: 9 (ref 5–15)
BUN: 9 mg/dL (ref 6–20)
BUN: 10 mg/dL (ref 6–20)
BUN: 9 mg/dL (ref 6–20)
BUN: 9 mg/dL (ref 6–20)
CO2: 16 mmol/L — ABNORMAL LOW (ref 22–32)
CO2: 21 mmol/L — ABNORMAL LOW (ref 22–32)
CO2: 23 mmol/L (ref 22–32)
CO2: 24 mmol/L (ref 22–32)
Calcium: 8.9 mg/dL (ref 8.9–10.3)
Calcium: 9.1 mg/dL (ref 8.9–10.3)
Calcium: 9.1 mg/dL (ref 8.9–10.3)
Calcium: 9 mg/dL (ref 8.9–10.3)
Chloride: 94 mmol/L — ABNORMAL LOW (ref 98–111)
Chloride: 100 mmol/L (ref 98–111)
Chloride: 100 mmol/L (ref 98–111)
Chloride: 98 mmol/L (ref 98–111)
Creatinine, Ser: 0.74 mg/dL (ref 0.61–1.24)
Creatinine, Ser: 0.55 mg/dL — ABNORMAL LOW (ref 0.61–1.24)
Creatinine, Ser: 0.59 mg/dL — ABNORMAL LOW (ref 0.61–1.24)
Creatinine, Ser: 0.5 mg/dL — ABNORMAL LOW (ref 0.61–1.24)
GFR, Estimated: >60 mL/min (ref >60)
GFR, Estimated: >60 mL/min (ref >60)
GFR, Estimated: >60 mL/min (ref >60)
GFR, Estimated: >60 mL/min (ref >60)
Glucose, Bld: 250 mg/dL — ABNORMAL HIGH (ref 70–99)
Glucose, Bld: 152 mg/dL — ABNORMAL HIGH (ref 70–99)
Glucose, Bld: 142 mg/dL — ABNORMAL HIGH (ref 70–99)
Glucose, Bld: 121 mg/dL — ABNORMAL HIGH (ref 70–99)
Potassium: 4 mmol/L (ref 3.5–5.1)
Potassium: 4.1 mmol/L (ref 3.5–5.1)
Potassium: 4.8 mmol/L (ref 3.5–5.1)
Potassium: 3.7 mmol/L (ref 3.5–5.1)
Sodium: 128 mmol/L — ABNORMAL LOW (ref 135–145)
Sodium: 131 mmol/L — ABNORMAL LOW (ref 135–145)
Sodium: 130 mmol/L — ABNORMAL LOW (ref 135–145)
Sodium: 131 mmol/L — ABNORMAL LOW (ref 135–145)

## 2020-05-28 LAB — GLUCOSE, CAPILLARY
Glucose-Capillary: 223 mg/dL — ABNORMAL HIGH (ref 70–99)
Glucose-Capillary: 172 mg/dL — ABNORMAL HIGH (ref 70–99)
Glucose-Capillary: 154 mg/dL — ABNORMAL HIGH (ref 70–99)
Glucose-Capillary: 144 mg/dL — ABNORMAL HIGH (ref 70–99)
Glucose-Capillary: 164 mg/dL — ABNORMAL HIGH (ref 70–99)
Glucose-Capillary: 115 mg/dL — ABNORMAL HIGH (ref 70–99)
Glucose-Capillary: 145 mg/dL — ABNORMAL HIGH (ref 70–99)
Glucose-Capillary: 138 mg/dL — ABNORMAL HIGH (ref 70–99)
Glucose-Capillary: 134 mg/dL — ABNORMAL HIGH (ref 70–99)
Glucose-Capillary: 138 mg/dL — ABNORMAL HIGH (ref 70–99)
Glucose-Capillary: 113 mg/dL — ABNORMAL HIGH (ref 70–99)

## 2020-05-28 LAB — BETA-HYDROXYBUTYRIC ACID
Beta-Hydroxybutyric Acid: >8 mmol/L — ABNORMAL HIGH (ref 0.05–0.27)
Beta-Hydroxybutyric Acid: 2.01 mmol/L — ABNORMAL HIGH (ref 0.05–0.27)
Beta-Hydroxybutyric Acid: 0.45 mmol/L — ABNORMAL HIGH (ref 0.05–0.27)

## 2020-05-28 LAB — HEMOGLOBIN A1C
Hgb A1c MFr Bld: 10.6 % — ABNORMAL HIGH (ref 4.8–5.6)
Mean Plasma Glucose: 257.52 mg/dL

## 2020-05-28 LAB — HIV ANTIBODY (ROUTINE TESTING W REFLEX): HIV Screen 4th Generation wRfx: NONREACTIVE

## 2020-05-28 LAB — LACTIC ACID, PLASMA: Lactic Acid, Venous: 1.2 mmol/L (ref 0.5–1.9)

## 2020-05-28 MED ORDER — INSULIN GLARGINE 100 UNIT/ML ~~LOC~~ SOLN
10.0000 [IU] | SUBCUTANEOUS | Status: DC
  Administered 2020-05-28: 10 [IU] via SUBCUTANEOUS
  Filled 2020-05-28: qty 0.1

## 2020-05-28 MED ORDER — DEXTROSE 50 % IV SOLN: 0.0000 mL | INTRAVENOUS | Status: DC | PRN

## 2020-05-28 MED ORDER — INSULIN REGULAR(HUMAN) IN NACL 100-0.9 UT/100ML-% IV SOLN
INTRAVENOUS | Status: DC
  Administered 2020-05-28: 6.5 [IU]/h via INTRAVENOUS
  Filled 2020-05-28: qty 100

## 2020-05-28 MED ORDER — LACTATED RINGERS IV SOLN: INTRAVENOUS | Status: DC

## 2020-05-28 MED ORDER — LACTATED RINGERS IV BOLUS
1000.0000 mL | Freq: Once | INTRAVENOUS | Status: AC
  Administered 2020-05-28: 1000 mL via INTRAVENOUS

## 2020-05-28 MED ORDER — POTASSIUM CHLORIDE 10 MEQ/100ML IV SOLN
10.0000 meq | INTRAVENOUS | Status: AC
  Administered 2020-05-28 (×2): 10 meq via INTRAVENOUS
  Filled 2020-05-28 (×2): qty 100

## 2020-05-28 MED ORDER — DEXTROSE IN LACTATED RINGERS 5 % IV SOLN: INTRAVENOUS | Status: DC

## 2020-05-28 MED ORDER — CARVEDILOL 3.125 MG PO TABS
3.1250 mg | ORAL_TABLET | Freq: Two times a day (BID) | ORAL | Status: DC
  Administered 2020-05-28 – 2020-05-30 (×4): 3.125 mg via ORAL
  Filled 2020-05-28 (×4): qty 1

## 2020-05-28 MED ORDER — HYDROMORPHONE HCL 1 MG/ML IJ SOLN
0.5000 mg | INTRAMUSCULAR | Status: DC | PRN
  Administered 2020-05-28 – 2020-05-29 (×8): 1 mg via INTRAVENOUS
  Filled 2020-05-28 (×8): qty 1

## 2020-05-28 MED ORDER — LOSARTAN POTASSIUM 50 MG PO TABS
50.0000 mg | ORAL_TABLET | Freq: Every day | ORAL | Status: DC
  Administered 2020-05-28 – 2020-05-30 (×3): 50 mg via ORAL
  Filled 2020-05-28 (×3): qty 1

## 2020-05-28 MED ORDER — CHLORHEXIDINE GLUCONATE CLOTH 2 % EX PADS
6.0000 | MEDICATED_PAD | Freq: Every day | CUTANEOUS | Status: DC
  Administered 2020-05-28 – 2020-05-29 (×2): 6 via TOPICAL

## 2020-05-28 MED ORDER — INSULIN ASPART 100 UNIT/ML ~~LOC~~ SOLN: 0.0000 [IU] | SUBCUTANEOUS | Status: DC

## 2020-05-28 MED ORDER — ENOXAPARIN SODIUM 40 MG/0.4ML ~~LOC~~ SOLN
40.0000 mg | SUBCUTANEOUS | Status: DC
  Administered 2020-05-28 – 2020-05-30 (×3): 40 mg via SUBCUTANEOUS
  Filled 2020-05-28 (×3): qty 0.4

## 2020-05-28 MED ORDER — INSULIN ASPART 100 UNIT/ML ~~LOC~~ SOLN
0.0000 [IU] | SUBCUTANEOUS | Status: DC
  Administered 2020-05-28 – 2020-05-29 (×2): 2 [IU] via SUBCUTANEOUS
  Administered 2020-05-29: 3 [IU] via SUBCUTANEOUS

## 2020-05-28 NOTE — Progress Notes (Signed)
Rapid Response Staff at bedside    Will continue to montior

## 2020-05-28 NOTE — Progress Notes (Signed)
Inpatient Diabetes Program Recommendations  AACE/ADA: New Consensus Statement on Inpatient Glycemic Control (2015)  Target Ranges:  Prepandial:   less than 140 mg/dL      Peak postprandial:   less than 180 mg/dL (1-2 hours)      Critically ill patients:  140 - 180 mg/dL   Lab Results  Component Value Date   GLUCAP 172 (H) 05/28/2020   HGBA1C 6.0 (H) 12/18/2016    Review of Glycemic Control Results for ANTWON, ROCHIN (MRN [id number]) as of 05/28/2020 08:36  Ref. Range 05/28/2020 06:20 05/28/2020 07:44  Glucose-Capillary Latest Ref Range: 70 - 99 mg/dL 354 (H) 562 (H)   Diabetes history: new onset? Outpatient Diabetes medications: none Current orders for Inpatient glycemic control: IV insulin  Inpatient Diabetes Program Recommendations:    Noted consult. At this time given labs would continue with IV insulin.   A1C and C-peptide have been ordered. Awaiting collected. Secure chat sent to RN regarding obtaining blood sample to being processing. Will plan to speak with patient once results are obtained.   Thanks, Lujean Rave, MSN, RNC-OB Diabetes Coordinator [phone] (8a-5p)

## 2020-05-28 NOTE — Progress Notes (Signed)
Received patient from [facility].  MD on call at bedside   Patient reporting 10 out of 10 pain.  New orders to follow

## 2020-05-28 NOTE — Progress Notes (Signed)
Initial Nutrition Assessment  RD working remotely.  DOCUMENTATION CODES:   Not applicable  INTERVENTION:  - diet advancement as medically feasible. - if unable to advance diet in the next 3-5 days, recommend small bore NGT and initiation of TF. - will monitor for diet education needs prior to d/c.   NUTRITION DIAGNOSIS:   Increased nutrient needs related to acute illness, chronic illness as evidenced by estimated needs.  GOAL:   Patient will meet greater than or equal to 90% of their needs  MONITOR:   Diet advancement, Labs, Weight trends, I & O's  REASON FOR ASSESSMENT:   Malnutrition Screening Tool    ASSESSMENT:   54 y.o. male with medical history of chronic and recurrent pancreatitis, remote hx of alcohol abuse, cirrhosis of liver, portal hypertensive gastropathy, insomnia, and PTSD. He works as a [employer]. Patient presented to the ED due to intractable N/V and epigastric abdominal pain which began a few days ago and has worsened to the point of being severe. He was given IV fluids and anti-nausea medication in the ED.  Patient was admitted overnight and remains NPO since admission. He has not been seen by a [place] RD since 12/27/16.   Weight today is 179 lb and PTA the most recently documented weight was on 01/09/19 when he weighed 209 lb. This indicates 30 lb weight loss (14% body weight) but true time frame for weight loss is unknown.  Able to communicate with RN via secure chat. Patient remains on insulin drip; no plan for diet advancement at this time.   Per notes: - DKA--DM Coordinator consulted - plan to check C-peptide to determine insulin function of pancreas  - chronic pancreatitis with possible acute flare - hyponatremia with IV fluid ordered   Labs reviewed; HgbA1c not currently known, CBGs: 223 and 172 mg/dl, Na: 938 mmol/l, Cl: 94 mmol/l. Medications reviewed; 10 mEq IV KCl x2 runs 12/4.  IVF; D5-LR @ 125 ml/hr (510 kcal). Drip; insulin @ 6.5  units/hr.     NUTRITION - FOCUSED PHYSICAL EXAM:  unable to complete at this time.   Diet Order:   Diet Order            Diet NPO time specified Except for: Ice Chips  Diet effective now                 EDUCATION NEEDS:   No education needs have been identified at this time  Skin:  Skin Assessment: Reviewed RN Assessment  Last BM:  11/30 (patient reported)  Height:   Ht Readings from Last 1 Encounters:  05/28/20 6\' 2"  (1.88 m)    Weight:   Wt Readings from Last 1 Encounters:  05/28/20 81.4 kg     Estimated Nutritional Needs:  Kcal:  2300-2500 kcal Protein:  115-130 grams Fluid:  >/= 2.5 L/day      14/04/21, MS, RD, LDN, CNSC Inpatient Clinical Dietitian RD pager # available in [facility]  After hours/weekend pager # available in [facility]

## 2020-05-28 NOTE — H&P (Signed)
History and Physical    Samuel Huffman YIR:[id number] DOB: 16-Jun-1966 DOA: 05/27/2020  PCP: Pearline Cables, MD  Patient coming from: Home  I have personally briefly reviewed patient's old medical records in [facility]  Chief Complaint: Abd pain, N/V  HPI: Samuel Huffman is a 54 y.o. male with medical history significant of chronic and recurrent pancreatitis, remote h/o EtOH abuse, cirrhosis of liver, portal hypertensive gastropathy.  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  Pt seen in ED with c/o intractable N/V and epigastric abd pain.  Symptoms onset a couple days ago, worsening, now severe.  Usually can get symptoms under control but unable to this time.  Seen in ED initially this morning, given IVF and nausea meds.  Symptoms persisted so returned to ED this afternoon.   ED Course: CT abd/pelvis: chronic pancreatitis, findings possibly of acute pancreatitis too.  Lipase 55.  BGL 285, AG 20, Bicarb 14.  >80 keytones in urine.  Pt put on IVF, pain meds, and transferred for admission.   Review of Systems: As per HPI, otherwise all review of systems negative.  Past Medical History:  Diagnosis Date  . Acute upper GI bleeding 08/02/2016  . Anxiety   . Blood transfusion without reported diagnosis   . Cirrhosis of liver with ascites (HCC)   . Hiatal hernia   . History of alcohol abuse   . Hypertension   . Insomnia   . Pancreatic pseudocyst   . Pancreatitis   . Portal hypertensive gastropathy (HCC)   . PTSD (post-traumatic stress disorder)   . Pulmonary nodule   . Varices, gastric     Past Surgical History:  Procedure Laterality Date  . ESOPHAGOGASTRODUODENOSCOPY N/A 12/18/2016   Procedure: ESOPHAGOGASTRODUODENOSCOPY (EGD);  Surgeon: Hilarie Fredrickson, MD;  Location: [facility];  Service: Endoscopy;  Laterality: N/A;  . ESOPHAGOGASTRODUODENOSCOPY (EGD) WITH PROPOFOL N/A 08/03/2016   Procedure: ESOPHAGOGASTRODUODENOSCOPY (EGD) WITH PROPOFOL;   Surgeon: Rachael Fee, MD;  Location: [facility] ENDOSCOPY;  Service: Endoscopy;  Laterality: N/A;  . EUS  08/03/2016   Procedure: UPPER ENDOSCOPIC ULTRASOUND (EUS) LINEAR;  Surgeon: Rachael Fee, MD;  Location: [facility] ENDOSCOPY;  Service: Endoscopy;;  . FEMUR SURGERY Left   . LAPAROSCOPY N/A 12/21/2016   Procedure: LAPAROSCOPY LYSIS OF ADHESIONS, EXPLORATORY LAPAROTOMY, SPLENECTOMY;  Surgeon: Avel Peace, MD;  Location: [facility] ORS;  Service: General;  Laterality: N/A;  . SPLENECTOMY       reports that he has been smoking cigarettes. He has been smoking about 0.50 packs per day. His smokeless tobacco use includes snuff. He reports that he does not drink alcohol and does not use drugs.  No Known Allergies  Family History  Adopted: Yes  Problem Relation Age of Onset  . Healthy Son        x1     Prior to Admission medications   Medication Sig Start Date End Date Taking? Authorizing Provider  HYDROcodone-acetaminophen (NORCO/VICODIN) 5-325 MG tablet Take 1 tablet by mouth every 6 (six) hours as needed for moderate pain. 05/27/20  Yes Vanetta Mulders, MD  ondansetron (ZOFRAN ODT) 4 MG disintegrating tablet Take 1 tablet (4 mg total) by mouth every 8 (eight) hours as needed. 05/27/20  Yes Vanetta Mulders, MD  pantoprazole (PROTONIX) 40 MG tablet TAKE 1 TABLET(40 MG) BY MOUTH DAILY Patient taking differently: Take 40 mg by mouth daily.  02/03/20  Yes Zehr, Princella Pellegrini, PA-C  amoxicillin-clavulanate (AUGMENTIN) 875-125 MG tablet Take 1 tablet by mouth 2 (  two) times daily. 10/09/19   Sandford Craze'Sullivan, Melissa, NP  ferrous sulfate (FERROUSUL) 325 (65 FE) MG tablet Take 1 tablet (325 mg total) by mouth daily with breakfast. 04/12/17   Elgergawy, Leana Roeawood S, MD    Physical Exam: Vitals:   05/27/20 2315 05/28/20 0030 05/28/20 0100 05/28/20 0208  BP:  (!) 160/102 (!) 159/100 (!) 141/96  Pulse: (!) 119 (!) 126 (!) 123 (!) 108  Resp: 14 (!) 22 17 18   Temp:    98.4 F (36.9 C)  TempSrc:    Oral  SpO2: 98% 99%  100%     Constitutional: NAD, calm, comfortable Eyes: PERRL, lids and conjunctivae normal ENMT: Mucous membranes are moist. Posterior pharynx clear of any exudate or lesions.Normal dentition.  Neck: normal, supple, no masses, no thyromegaly Respiratory: clear to auscultation bilaterally, no wheezing, no crackles. Normal respiratory effort. No accessory muscle use.  Cardiovascular: Regular rate and rhythm, no murmurs / rubs / gallops. No extremity edema. 2+ pedal pulses. No carotid bruits.  Abdomen: no tenderness, no masses palpated. No hepatosplenomegaly. Bowel sounds positive.  Musculoskeletal: no clubbing / cyanosis. No joint deformity upper and lower extremities. Good ROM, no contractures. Normal muscle tone.  Skin: no rashes, lesions, ulcers. No induration Neurologic: CN 2-12 grossly intact. Sensation intact, DTR normal. Strength 5/5 in all 4.  Psychiatric: Normal judgment and insight. Alert and oriented x 3. Normal mood.    Labs on Admission: I have personally reviewed following labs and imaging studies  CBC: Recent Labs  Lab 05/27/20 0637 05/27/20 2144  WBC 13.0* 16.4*  NEUTROABS 10.4* 14.6*  HGB 15.0 13.7  HCT 42.8 39.4  MCV 97.7 98.7  PLT 269 252   Basic Metabolic Panel: Recent Labs  Lab 05/27/20 0637 05/27/20 0945 05/27/20 2144 05/28/20 0247  NA 126* 128* 128* 128*  K 3.3* 3.3* 3.7 4.0  CL 89* 97* 94* 94*  CO2 21* 20* 14* 16*  GLUCOSE 334* 252* 285* 250*  BUN 14 11 10 9   CREATININE 0.67 0.55* 0.75 0.74  CALCIUM 9.1 7.8* 8.8* 8.9   GFR: Estimated Creatinine Clearance: 121.8 mL/min (by C-G formula based on SCr of 0.74 mg/dL). Liver Function Tests: Recent Labs  Lab 05/27/20 0637 05/27/20 2144  AST 56* 38  ALT 42 33  ALKPHOS 90 91  BILITOT 1.9* 2.1*  PROT 7.9 7.7  ALBUMIN 4.4 4.1   Recent Labs  Lab 05/27/20 0637 05/27/20 2144  LIPASE 55* 50   No results for input(s): AMMONIA in the last 168 hours. Coagulation Profile: No results for input(s):  INR, PROTIME in the last 168 hours. Cardiac Enzymes: No results for input(s): CKTOTAL, CKMB, CKMBINDEX, TROPONINI in the last 168 hours. BNP (last 3 results) No results for input(s): PROBNP in the last 8760 hours. HbA1C: No results for input(s): HGBA1C in the last 72 hours. CBG: No results for input(s): GLUCAP in the last 168 hours. Lipid Profile: No results for input(s): CHOL, HDL, LDLCALC, TRIG, CHOLHDL, LDLDIRECT in the last 72 hours. Thyroid Function Tests: No results for input(s): TSH, T4TOTAL, FREET4, T3FREE, THYROIDAB in the last 72 hours. Anemia Panel: No results for input(s): VITAMINB12, FOLATE, FERRITIN, TIBC, IRON, RETICCTPCT in the last 72 hours. Urine analysis:    Component Value Date/Time   COLORURINE YELLOW 05/27/2020 0845   APPEARANCEUR CLEAR 05/27/2020 0845   LABSPEC 1.015 05/27/2020 0845   PHURINE 6.0 05/27/2020 0845   GLUCOSEU >=500 (A) 05/27/2020 0845   HGBUR TRACE (A) 05/27/2020 0845   BILIRUBINUR NEGATIVE 05/27/2020 0845  KETONESUR >80 (A) 05/27/2020 0845   PROTEINUR NEGATIVE 05/27/2020 0845   UROBILINOGEN 0.2 10/01/2014 0539   NITRITE NEGATIVE 05/27/2020 0845   LEUKOCYTESUR NEGATIVE 05/27/2020 0845    Radiological Exams on Admission: CT Abdomen Pelvis W Contrast  Result Date: 05/27/2020 CLINICAL DATA:  Epigastric abdominal pain. EXAM: CT ABDOMEN AND PELVIS WITH CONTRAST TECHNIQUE: Multidetector CT imaging of the abdomen and pelvis was performed using the standard protocol following bolus administration of intravenous contrast. CONTRAST:  OMNIPAQUE IOHEXOL 300 MG/ML  SOLN COMPARISON:  May 27, 2018. FINDINGS: Lower chest: No acute abnormality. Hepatobiliary: No focal liver abnormality is seen. No gallstones, gallbladder Trimm thickening, or biliary dilatation. Pancreas: Calcifications are seen throughout the pancreas consistent with chronic pancreatitis. No ductal dilatation is noted. Some stranding of the surrounding tissues is noted around the  pancreatic head suggesting possible acute inflammation. Spleen: Status post splenectomy. Stable probable splenule is seen in the left upper quadrant. Adrenals/Urinary Tract: Adrenal glands are unremarkable. Kidneys are normal, without renal calculi, focal lesion, or hydronephrosis. Bladder is unremarkable. Stomach/Bowel: Stomach is within normal limits. Appendix appears normal. No evidence of bowel Jon thickening, distention, or inflammatory changes. Vascular/Lymphatic: No significant vascular findings are present. No enlarged abdominal or pelvic lymph nodes. Reproductive: Prostate is unremarkable. Other: Small fat containing periumbilical hernia is noted. No ascites is noted. Musculoskeletal: No acute or significant osseous findings. IMPRESSION: 1. Findings consistent with chronic pancreatitis. Some stranding of the surrounding tissues is noted around the pancreatic head suggesting possible acute pancreatitis. Correlation with clinical and laboratory findings is recommended. 2. Small fat containing periumbilical hernia. 3. Status post splenectomy. Electronically Signed   By: Lupita Raider M.D.   On: 05/27/2020 08:56    EKG: Independently reviewed.  Assessment/Plan Principal Problem:   DKA (diabetic ketoacidosis) (HCC) Active Problems:   Acute on chronic pancreatitis (HCC)   History of alcohol abuse   Hyponatremia   Liver cirrhosis (HCC)   Diabetes mellitus (HCC)    1. DKA - 1. On arrival: repeat BMP shows still has anion gap of 18, lactate 1.2, BHB > 8! 2. IVF: 1L in ED, giving 2nd L bolus here, then fluids per DKA pathway 3. Insulin gtt per DKA pathway 4. BMP Q4H 5. Serial BHBs 6. Checking C-peptide to see how much insulin function his pancreas has left (especially important in a truck driver) 1. Did discuss that I was concerned he may have to be on insulin as a result of DKA, but will defer final decision to day team.  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 7. Diabetes coordinator 2. Chronic pancreatitis -  1. Possibly with acute component 2. IVF as above 3. NPO 4. Dilaudid and zofran PRN 5. Will see how he is doing after we get DKA treated. 3. Hyponatremia - 1. IVF as above 2. Serial BMPs  DVT prophylaxis: Lovenox Code Status: Full Family Communication: No family in room Disposition Plan: Home after admit Consults called: None Admission status: Admit to inpatient  Severity of Illness: The appropriate patient status for this patient is INPATIENT. Inpatient status is judged to be reasonable and necessary in order to provide the required intensity of service to ensure the patient's safety. The patient's presenting symptoms, physical exam findings, and initial radiographic and laboratory data in the context of their chronic comorbidities is felt to place them at high risk for further clinical deterioration. Furthermore, it is not anticipated  that the patient will be medically stable for discharge from the hospital within 2 midnights of admission. The following factors support the patient status of inpatient.   IP status for treatment of DKA.   * I certify that at the point of admission it is my clinical judgment that the patient will require inpatient hospital care spanning beyond 2 midnights from the point of admission due to high intensity of service, high risk for further deterioration and high frequency of surveillance required.*    Arrionna Serena M. DO [facility]  How to contact the [facility] Attending or Consulting provider 7A - 7P or covering provider during after hours 7P -7A, for this patient?  1. Check the care team in [facility] and look for a) attending/consulting [facility] provider listed and b) the [facility] team listed 2. Log into [email or web address]  Amion Physician Scheduling and messaging for groups and whole hospitals  On call and physician scheduling software for group practices, residents, hospitalists and other  medical providers for call, clinic, rotation and shift schedules. OnCall [employer] is a hospital-wide system for scheduling doctors and paging doctors on call. EasyPlot is for scientific plotting and data analysis.  [email or web address]  and use [employer]'s universal password to access. If you do not have the password, please contact the hospital operator.  3. Locate the [facility] provider you are looking for under [facility] and page to a number that you can be directly reached. 4. If you still have difficulty reaching the provider, please page the [facility] (Director on Call) for the Hospitalists listed on amion for assistance.  05/28/2020, 3:56 AM

## 2020-05-28 NOTE — Progress Notes (Signed)
[facility] Progress Note  Patient: Samuel Huffman    OJJ:[id number]  DOA: 05/27/2020     Date of Service: the patient was seen and examined on 05/28/2020  Brief hospital course: Past medical history of chronic pancreatitis, remote alcohol abuse history, cirrhosis of the liver, portal hypertensive gastropathy.  Presents with complaints of abdominal pain nausea and vomiting.  Found to have DKA as well as acute on chronic pancreatitis. Currently plan is conservative measures.  Assessment and Plan: 1.  DKA Metabolic acidosis Beta hydroxybutyric acid improving. Anion gap improving. Treated with IV insulin. Currently transitioning to basal bolus regimen. Given patient's desire to not to be on insulin on discharge as well as patient currently being on clear liquid diet only we will be transitioning only to 10 units of Lantus. Continue moderate sliding scale every 4 hours for now. Continue IV fluids with LR.  2.  Acute on chronic pancreatitis. Continues to have severe abdominal pain. Continue pain medication. On clear liquid diet only. Monitor.  3.  Hyponatremia Corrected.  Monitor.  4.  Hypokalemia Corrected.  Monitor.  5.  New onset type 2 diabetes mellitus Last visit with her chronic pancreatitis. Hemoglobin A1c 10. Currently on basal bolus regimen Maximize oral  treatment on discharge.  Diet: Clear liquid diet DVT Prophylaxis:   enoxaparin (LOVENOX) injection 40 mg Start: 05/28/20 1000    Advance goals of care discussion: Full code  Family Communication: no family was present at bedside, at the time of interview.   Disposition:  Status is: Inpatient  Remains inpatient appropriate because:Inpatient level of care appropriate due to severity of illness   Dispo: The patient is from: Home              Anticipated d/c is to: Home              Anticipated d/c date is: 2 days              Patient currently is not medically stable to d/c.  Subjective: Continues to  have abdominal pain but no nausea no vomiting no fever no chills.  Passing gas but no BM.  Physical Exam:  General: Appear in mild distress, no Rash; Oral Mucosa Clear, moist. no Abnormal Neck Mass Or lumps, Conjunctiva normal  Cardiovascular: S1 and S2 Present, no Murmur, Respiratory: good respiratory effort, Bilateral Air entry present and CTA, no Crackles, no wheezes Abdomen: Bowel Sound present, Soft and mild tenderness Extremities: no Pedal edema Neurology: alert and oriented to time, place, and person affect appropriate. no new focal deficit Gait not checked due to patient safety concerns  Vitals:   05/28/20 1719 05/28/20 1844 05/28/20 1900 05/28/20 2000  BP:  (!) 150/88    Pulse: 96 99 91 95  Resp: 13 20 12 11   Temp:    98.4 F (36.9 C)  TempSrc:    Oral  SpO2: 100% 100% 93% 98%  Weight:      Height:        Intake/Output Summary (Last 24 hours) at 05/28/2020 2017 Last data filed at 05/28/2020 1840 Gross per 24 hour  Intake 3341.35 ml  Output 350 ml  Net 2991.35 ml   Filed Weights   05/28/20 0230  Weight: 81.4 kg    Data Reviewed: I have personally reviewed and interpreted daily labs, tele strips, imagings as discussed above. I reviewed all nursing notes, pharmacy notes, vitals, pertinent old records I have discussed plan of care as described above with RN and patient/family.  CBC: Recent Labs  Lab 05/27/20 0637 05/27/20 2144  WBC 13.0* 16.4*  NEUTROABS 10.4* 14.6*  HGB 15.0 13.7  HCT 42.8 39.4  MCV 97.7 98.7  PLT 269 252   Basic Metabolic Panel: Recent Labs  Lab 05/27/20 2144 05/28/20 0247 05/28/20 0923 05/28/20 1333 05/28/20 1726  NA 128* 128* 131* 130* 131*  K 3.7 4.0 4.1 4.8 3.7  CL 94* 94* 100 100 98  CO2 14* 16* 21* 23 24  GLUCOSE 285* 250* 152* 142* 121*  BUN 10 9 10 9 9   CREATININE 0.75 0.74 0.55* 0.59* 0.50*  CALCIUM 8.8* 8.9 9.1 9.1 9.0    Studies: No results found.  Scheduled Meds: . carvedilol  3.125 mg Oral BID WC  .  Chlorhexidine Gluconate Cloth  6 each Topical Daily  . enoxaparin (LOVENOX) injection  40 mg Subcutaneous Q24H  . insulin aspart  0-15 Units Subcutaneous Q4H  . insulin glargine  10 Units Subcutaneous Q24H  . losartan  50 mg Oral Daily   Continuous Infusions: . lactated ringers 75 mL/hr at 05/28/20 1840   PRN Meds: dextrose, HYDROmorphone (DILAUDID) injection  Time spent: 35 minutes  Author: 14/04/21, MD [facility] 05/28/2020 8:17 PM  To reach On-call, see care teams to locate the attending and reach out via [email or web address]. Between 7PM-7AM, please contact night-coverage If you still have difficulty reaching the attending provider, please page the [facility] (Director on Call) for [facility] on amion for assistance.

## 2020-05-29 LAB — CBC
HCT: 33.3 % — ABNORMAL LOW (ref 39.0–52.0)
Hemoglobin: 11.5 g/dL — ABNORMAL LOW (ref 13.0–17.0)
MCH: 35 pg — ABNORMAL HIGH (ref 26.0–34.0)
MCHC: 34.5 g/dL (ref 30.0–36.0)
MCV: 101.2 fL — ABNORMAL HIGH (ref 80.0–100.0)
Platelets: 223 10*3/uL (ref 150–400)
RBC: 3.29 MIL/uL — ABNORMAL LOW (ref 4.22–5.81)
RDW: 12.3 % (ref 11.5–15.5)
WBC: 9.1 10*3/uL (ref 4.0–10.5)
nRBC: 0 % (ref 0.0–0.2)

## 2020-05-29 LAB — HEPATIC FUNCTION PANEL
ALT: 19 U/L (ref 0–44)
AST: 20 U/L (ref 15–41)
Albumin: 3.3 g/dL — ABNORMAL LOW (ref 3.5–5.0)
Alkaline Phosphatase: 70 U/L (ref 38–126)
Bilirubin, Direct: 0.2 mg/dL (ref 0.0–0.2)
Indirect Bilirubin: 0.7 mg/dL (ref 0.3–0.9)
Total Bilirubin: 0.9 mg/dL (ref 0.3–1.2)
Total Protein: 6.6 g/dL (ref 6.5–8.1)

## 2020-05-29 LAB — GLUCOSE, CAPILLARY
Glucose-Capillary: 156 mg/dL — ABNORMAL HIGH (ref 70–99)
Glucose-Capillary: 110 mg/dL — ABNORMAL HIGH (ref 70–99)
Glucose-Capillary: 132 mg/dL — ABNORMAL HIGH (ref 70–99)
Glucose-Capillary: 171 mg/dL — ABNORMAL HIGH (ref 70–99)
Glucose-Capillary: 155 mg/dL — ABNORMAL HIGH (ref 70–99)
Glucose-Capillary: 125 mg/dL — ABNORMAL HIGH (ref 70–99)

## 2020-05-29 LAB — BASIC METABOLIC PANEL
Anion gap: 12 (ref 5–15)
BUN: 8 mg/dL (ref 6–20)
CO2: 22 mmol/L (ref 22–32)
Calcium: 9.2 mg/dL (ref 8.9–10.3)
Chloride: 97 mmol/L — ABNORMAL LOW (ref 98–111)
Creatinine, Ser: 0.48 mg/dL — ABNORMAL LOW (ref 0.61–1.24)
GFR, Estimated: >60 mL/min (ref >60)
Glucose, Bld: 117 mg/dL — ABNORMAL HIGH (ref 70–99)
Potassium: 3.1 mmol/L — ABNORMAL LOW (ref 3.5–5.1)
Sodium: 131 mmol/L — ABNORMAL LOW (ref 135–145)

## 2020-05-29 LAB — C-PEPTIDE: C-Peptide: 0.8 ng/mL — ABNORMAL LOW (ref 1.1–4.4)

## 2020-05-29 LAB — LIPASE, BLOOD: Lipase: 26 U/L (ref 11–51)

## 2020-05-29 MED ORDER — GABAPENTIN 300 MG PO CAPS
300.0000 mg | ORAL_CAPSULE | Freq: Three times a day (TID) | ORAL | Status: DC
  Administered 2020-05-29 – 2020-05-30 (×4): 300 mg via ORAL
  Filled 2020-05-29 (×4): qty 1

## 2020-05-29 MED ORDER — HYDROMORPHONE HCL 1 MG/ML IJ SOLN
0.5000 mg | INTRAMUSCULAR | Status: DC | PRN
  Administered 2020-05-29: 0.5 mg via INTRAVENOUS
  Filled 2020-05-29: qty 0.5

## 2020-05-29 MED ORDER — GLIMEPIRIDE 4 MG PO TABS: 4.0000 mg | ORAL_TABLET | Freq: Every day | ORAL | Status: DC

## 2020-05-29 MED ORDER — INFLUENZA VAC A&B SA ADJ QUAD 0.5 ML IM PRSY
0.5000 mL | PREFILLED_SYRINGE | INTRAMUSCULAR | Status: AC
  Administered 2020-05-30: 0.5 mL via INTRAMUSCULAR
  Filled 2020-05-29: qty 0.5

## 2020-05-29 MED ORDER — INSULIN GLARGINE 100 UNIT/ML ~~LOC~~ SOLN
10.0000 [IU] | Freq: Every day | SUBCUTANEOUS | Status: DC
  Filled 2020-05-29: qty 0.1

## 2020-05-29 MED ORDER — GLIMEPIRIDE 4 MG PO TABS
4.0000 mg | ORAL_TABLET | Freq: Every day | ORAL | Status: DC
  Administered 2020-05-29 – 2020-05-30 (×2): 4 mg via ORAL
  Filled 2020-05-29 (×2): qty 1

## 2020-05-29 MED ORDER — POTASSIUM CHLORIDE CRYS ER 20 MEQ PO TBCR
40.0000 meq | EXTENDED_RELEASE_TABLET | ORAL | Status: AC
  Administered 2020-05-29 (×2): 40 meq via ORAL
  Filled 2020-05-29 (×2): qty 2

## 2020-05-29 MED ORDER — METFORMIN HCL ER 750 MG PO TB24
750.0000 mg | ORAL_TABLET | Freq: Two times a day (BID) | ORAL | Status: DC
  Administered 2020-05-29 – 2020-05-30 (×2): 750 mg via ORAL
  Filled 2020-05-29 (×3): qty 1

## 2020-05-29 MED ORDER — LIVING WELL WITH DIABETES BOOK
Freq: Once | Status: AC
  Filled 2020-05-29: qty 1

## 2020-05-29 MED ORDER — KETOROLAC TROMETHAMINE 30 MG/ML IJ SOLN
30.0000 mg | Freq: Four times a day (QID) | INTRAMUSCULAR | Status: DC
  Administered 2020-05-29 – 2020-05-30 (×5): 30 mg via INTRAVENOUS
  Filled 2020-05-29 (×5): qty 1

## 2020-05-29 MED ORDER — METFORMIN HCL ER 500 MG PO TB24
500.0000 mg | ORAL_TABLET | Freq: Two times a day (BID) | ORAL | Status: DC
  Filled 2020-05-29: qty 1

## 2020-05-29 MED ORDER — INSULIN ASPART 100 UNIT/ML ~~LOC~~ SOLN: 0.0000 [IU] | Freq: Every day | SUBCUTANEOUS | Status: DC

## 2020-05-29 MED ORDER — INSULIN ASPART 100 UNIT/ML ~~LOC~~ SOLN
0.0000 [IU] | Freq: Three times a day (TID) | SUBCUTANEOUS | Status: DC
  Administered 2020-05-29 (×2): 3 [IU] via SUBCUTANEOUS
  Administered 2020-05-30: 2 [IU] via SUBCUTANEOUS
  Administered 2020-05-30: 3 [IU] via SUBCUTANEOUS

## 2020-05-29 MED ORDER — ACETAMINOPHEN 325 MG PO TABS: 650.0000 mg | ORAL_TABLET | Freq: Four times a day (QID) | ORAL | Status: DC | PRN

## 2020-05-29 MED ORDER — HYDROCODONE-ACETAMINOPHEN 5-325 MG PO TABS
1.0000 | ORAL_TABLET | Freq: Four times a day (QID) | ORAL | Status: DC | PRN
  Administered 2020-05-29 (×2): 1 via ORAL
  Filled 2020-05-29 (×2): qty 1

## 2020-05-29 NOTE — Progress Notes (Signed)
Inpatient Diabetes Program Recommendations  AACE/ADA: New Consensus Statement on Inpatient Glycemic Control (2015)  Target Ranges:  Prepandial:   less than 140 mg/dL      Peak postprandial:   less than 180 mg/dL (1-2 hours)      Critically ill patients:  140 - 180 mg/dL   Lab Results  Component Value Date   GLUCAP 171 (H) 05/29/2020   HGBA1C 10.6 (H) 05/28/2020    Review of Glycemic Control Results for Samuel Huffman, Samuel Huffman (MRN [id number]) as of 05/29/2020 10:52  Ref. Range 05/29/2020 00:59 05/29/2020 05:04 05/29/2020 07:33  Glucose-Capillary Latest Ref Range: 70 - 99 mg/dL 372 (H) 902 (H) 111 (H)    Diabetes history: new onset Outpatient Diabetes medications: none Current orders for Inpatient glycemic control: Lantus 10 units QHS, Novolog 0-15 units TID, Novolog 0-5 units QHS  Inpatient Diabetes Program Recommendations:    Spoke with patient regarding new onset DM. 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  Reviewed patient's current A1c of 10.6%. Explained what a A1c is and what it measures. Also reviewed goal A1c with patient, importance of good glucose control @ home, and blood sugar goals. Reviewed patho of DM, DKA, role of pancreas, survival skills, interventions, impact of pancreatitis, hyper vs hypo glycemia, vascular changes and comorbidities.   Patient will need a meter at discharge. Blood glucose meter (inlcudes lancets and strips) (#[id number]). Encouraged checking 2 times per day and reviewed frequency, when to call MD and need for follow up. Encouraged patient to practice while inpatient.  Patient admits to significant intake of CHO and sugary beverages. Is very motivated on making dietary changes to improve diabetes. Reviewed plate method, carb counting, carb alottments, nutritional labels, and alternatives to sugary beverages and goal setting. Dietitian consult placed, OP education and [facility] placed.  Discussed with MD Metformin and Glimepiride at discharge with close  PCP follow up. Patient to make appointment for this week. LWWDM book ordered.   Thanks, Lujean Rave, MSN, RNC-OB Diabetes Coordinator [phone] (8a-5p)

## 2020-05-29 NOTE — Progress Notes (Signed)
[facility] Progress Note  Patient: Samuel Huffman    DJM:[id number]  DOA: 05/27/2020     Date of Service: the patient was seen and examined on 05/29/2020  Brief hospital course: Past medical history of chronic pancreatitis, remote alcohol abuse history, cirrhosis of the liver, portal hypertensive gastropathy. Presents with complaints of abdominal pain nausea and vomiting. Found to have DKA as well as acute on chronic pancreatitis.  Currently plan is monitor glucose levels while on oral regimen.  Assessment and Plan: 1.  DKA Metabolic acidosis Beta hydroxybutyric acid resolved. Anion gap resolved. Treated with IV insulin. Currently transitioning to basal bolus regimen. Given patient's desire to not to be on insulin on discharge we will initiate on Metformin and glimepiride.  Monitor with sliding scale insulin.  2.  Acute on chronic pancreatitis. Continues to have severe abdominal pain. Continue pain medication. Monitor.  3.  Hyponatremia Corrected.  Monitor.  4.  Hypokalemia Corrected.  Monitor.  5.  New onset type 2 diabetes mellitus Last visit with her chronic pancreatitis. Hemoglobin A1c 10. Currently on basal bolus regimen Patient does not want to be on insulin. C-peptide is significantly low. Currently will attempt to see how he does with Metformin, glimepiride and lifestyle modification. Outpatient follow-up with PCP for further modification of this regimen.  Diet: Carb modified diet DVT Prophylaxis:   enoxaparin (LOVENOX) injection 40 mg Start: 05/28/20 1000    Advance goals of care discussion: Full code  Family Communication: no family was present at bedside, at the time of interview.   Disposition:  Status is: Inpatient  Remains inpatient appropriate because:Inpatient level of care appropriate due to severity of illness   Dispo: The patient is from: Home              Anticipated d/c is to: Home              Anticipated d/c date is: 1 day               Patient currently is not medically stable to d/c.   Subjective: Pain well controlled.  No nausea no vomiting.  Physical Exam:  General: Appear in mild distress, no Rash; Oral Mucosa Clear, moist. no Abnormal Neck Mass Or lumps, Conjunctiva normal  Cardiovascular: S1 and S2 Present, no Murmur, Respiratory: good respiratory effort, Bilateral Air entry present and CTA, no Crackles, no wheezes Abdomen: Bowel Sound present, Soft and no tenderness Extremities: no Pedal edema Neurology: alert and oriented to time, place, and person affect appropriate. no new focal deficit Gait not checked due to patient safety concerns  Vitals:   05/29/20 0231 05/29/20 0502 05/29/20 0846 05/29/20 1408  BP: (!) 148/89 133/85 123/77 123/83  Pulse: 87 86 82 84  Resp: 18 18 18 20   Temp: 98.6 F (37 C) 98.1 F (36.7 C) 98 F (36.7 C) 98.2 F (36.8 C)  TempSrc: Oral Oral Oral Oral  SpO2: 100% 97% 97% 100%  Weight:      Height:        Intake/Output Summary (Last 24 hours) at 05/29/2020 1901 Last data filed at 05/29/2020 1400 Gross per 24 hour  Intake 1384.8 ml  Output --  Net 1384.8 ml   Filed Weights   05/28/20 0230  Weight: 81.4 kg    Data Reviewed: I have personally reviewed and interpreted daily labs, tele strips, imagings as discussed above. I reviewed all nursing notes, pharmacy notes, vitals, pertinent old records I have discussed plan of care as described above with  RN and patient/family.  CBC: Recent Labs  Lab 05/27/20 0637 05/27/20 2144 05/29/20 0547  WBC 13.0* 16.4* 9.1  NEUTROABS 10.4* 14.6*  --   HGB 15.0 13.7 11.5*  HCT 42.8 39.4 33.3*  MCV 97.7 98.7 101.2*  PLT 269 252 223   Basic Metabolic Panel: Recent Labs  Lab 05/28/20 0247 05/28/20 0923 05/28/20 1333 05/28/20 1726 05/29/20 0547  NA 128* 131* 130* 131* 131*  K 4.0 4.1 4.8 3.7 3.1*  CL 94* 100 100 98 97*  CO2 16* 21* 23 24 22   GLUCOSE 250* 152* 142* 121* 117*  BUN 9 10 9 9 8   CREATININE 0.74 0.55*  0.59* 0.50* 0.48*  CALCIUM 8.9 9.1 9.1 9.0 9.2    Studies: No results found.  Scheduled Meds: . carvedilol  3.125 mg Oral BID WC  . Chlorhexidine Gluconate Cloth  6 each Topical Daily  . enoxaparin (LOVENOX) injection  40 mg Subcutaneous Q24H  . gabapentin  300 mg Oral TID  . glimepiride  4 mg Oral Q breakfast  . [START ON 05/30/2020] influenza vaccine adjuvanted  0.5 mL Intramuscular Tomorrow-1000  . insulin aspart  0-15 Units Subcutaneous TID WC  . insulin aspart  0-5 Units Subcutaneous QHS  . ketorolac  30 mg Intravenous Q6H  . losartan  50 mg Oral Daily  . metFORMIN  750 mg Oral BID WC   Continuous Infusions: . lactated ringers 75 mL/hr at 05/28/20 2332   PRN Meds: acetaminophen, dextrose, HYDROcodone-acetaminophen, HYDROmorphone (DILAUDID) injection  Time spent: 35 minutes  Author: 14/11/2019, MD [facility] 05/29/2020 7:01 PM  To reach On-call, see care teams to locate the attending and reach out via [email or web address]. Between 7PM-7AM, please contact night-coverage If you still have difficulty reaching the attending provider, please page the [facility] (Director on Call) for [facility] on amion for assistance.

## 2020-05-30 LAB — BASIC METABOLIC PANEL
Anion gap: 9 (ref 5–15)
BUN: 14 mg/dL (ref 6–20)
CO2: 22 mmol/L (ref 22–32)
Calcium: 8.9 mg/dL (ref 8.9–10.3)
Chloride: 103 mmol/L (ref 98–111)
Creatinine, Ser: 0.65 mg/dL (ref 0.61–1.24)
GFR, Estimated: >60 mL/min (ref >60)
Glucose, Bld: 175 mg/dL — ABNORMAL HIGH (ref 70–99)
Potassium: 3.3 mmol/L — ABNORMAL LOW (ref 3.5–5.1)
Sodium: 134 mmol/L — ABNORMAL LOW (ref 135–145)

## 2020-05-30 LAB — CBC
HCT: 31.6 % — ABNORMAL LOW (ref 39.0–52.0)
Hemoglobin: 10.5 g/dL — ABNORMAL LOW (ref 13.0–17.0)
MCH: 34.5 pg — ABNORMAL HIGH (ref 26.0–34.0)
MCHC: 33.2 g/dL (ref 30.0–36.0)
MCV: 103.9 fL — ABNORMAL HIGH (ref 80.0–100.0)
Platelets: 261 10*3/uL (ref 150–400)
RBC: 3.04 MIL/uL — ABNORMAL LOW (ref 4.22–5.81)
RDW: 12.7 % (ref 11.5–15.5)
WBC: 6.6 10*3/uL (ref 4.0–10.5)
nRBC: 0 % (ref 0.0–0.2)

## 2020-05-30 LAB — GLUCOSE, CAPILLARY
Glucose-Capillary: 126 mg/dL — ABNORMAL HIGH (ref 70–99)
Glucose-Capillary: 156 mg/dL — ABNORMAL HIGH (ref 70–99)
Glucose-Capillary: 181 mg/dL — ABNORMAL HIGH (ref 70–99)
Glucose-Capillary: 186 mg/dL — ABNORMAL HIGH (ref 70–99)

## 2020-05-30 LAB — LIPASE, BLOOD: Lipase: 32 U/L (ref 11–51)

## 2020-05-30 MED ORDER — BLOOD GLUCOSE METER KIT: PACK | 0 refills | Status: DC

## 2020-05-30 MED ORDER — METFORMIN HCL ER 750 MG PO TB24
750.0000 mg | ORAL_TABLET | Freq: Two times a day (BID) | ORAL | 0 refills | Status: DC
Start: 2020-05-30 — End: 2020-06-13

## 2020-05-30 MED ORDER — LOSARTAN POTASSIUM 50 MG PO TABS
50.0000 mg | ORAL_TABLET | Freq: Every day | ORAL | 0 refills | Status: DC
Start: 2020-05-31 — End: 2020-06-13

## 2020-05-30 MED ORDER — GLIMEPIRIDE 4 MG PO TABS
4.0000 mg | ORAL_TABLET | Freq: Every day | ORAL | 0 refills | Status: DC
Start: 2020-05-31 — End: 2020-06-13

## 2020-05-30 MED ORDER — GABAPENTIN 300 MG PO CAPS
300.0000 mg | ORAL_CAPSULE | Freq: Three times a day (TID) | ORAL | 0 refills | Status: DC
Start: 2020-05-30 — End: 2021-07-24

## 2020-05-30 MED ORDER — POTASSIUM CHLORIDE CRYS ER 20 MEQ PO TBCR
40.0000 meq | EXTENDED_RELEASE_TABLET | Freq: Once | ORAL | Status: AC
  Administered 2020-05-30: 40 meq via ORAL
  Filled 2020-05-30: qty 2

## 2020-05-30 NOTE — Progress Notes (Signed)
NUTRITION NOTE  Consult received for new onset DM. Discharge order to home entered earlier today. No discharge summary yet. Unsure if RD will make it to patient's room before he is discharged so will place DM diet education handouts from the Academy of [employer] in Discharge Instructions: "Label Reading Tips for Diabetes" and "Carbohydrate Counting for People with Diabetes."   Patient is being followed by the Diabetes Coordinator, who last saw him earlier today. DM Coordinator who saw him is also a RD and her note outlines that patient reports consuming a lot of sweets and soda. DM Coordinator "discussed portion control, eating more vegies, whole-grains, and lean meat, low fat dairy.Gave ideas for eating out and fast-food restaurants. Stressed importance of exercise and checking blood sugars 1-2x/day."  Diet advanced from NPO to CLD on 12/4 at 1518, to Soft on 12/5 at 0915, and to Carb Modified on 12/5 at 1040.   The only documented intakes in the chart are 90% of breakfast and 100% of lunch on 12/5 and 100% of breakfast this AM.   Labs reviewed; CBGs: 156, 126, 181 mg/dl; HgbA1c: 10.6%. Medications reviewed; sliding scale novolog, 750 mg metformin BID.         Trenton Gammon, MS, RD, LDN, CNSC Inpatient Clinical Dietitian RD pager # available in [facility]  After hours/weekend pager # available in [facility]

## 2020-05-30 NOTE — Discharge Instructions (Signed)
Diabetes Label Reading Tips Check the Nutrition Facts on food labels for nutrient information for the food. The Nutrition Facts information is based on a standard serving size.  However, that serving size may not be the same as the serving size used in carbohydrate counting. Always start by checking the serving size on the label.  Is this the serving size you will be eating? How many servings are in the package? Next, look at the total carbohydrate. It is measured in grams (g).  To find the number of carbohydrate servings in 1 standard serving of a food, divide the total grams of carbohydrate by 15.  One (1) carbohydrate serving is the amount of food with 15 g carbohydrate. You don't need to count grams of sugars. They are included in the total carbohydrate. The label shows how many calories are in the standard serving.  It also lists the amount of fat, cholesterol, sodium, protein, and some vitamins and minerals. Look below the line listing total fat to find out how much of that fat is saturated fat or trans fat.  Choose foods that are low in these kinds of fats because they are not healthy for your heart.  In the foods that are healthiest for your heart, grams of saturated fat and trans fat are less than one-third of the total fat grams. If foods are very low in calories (less than 20 calories per serving) or carbohydrates (5 g carbohydrate or less per serving), you may not need to count them when you count carbohydrates.     Carbohydrate Counting for People with Diabetes Foods with carbohydrates make your blood glucose level go up.  Learning how to count carbohydrates can help you control your blood glucose levels.  First, identify the foods you eat that contain carbohydrates.  Then, using the Foods with Carbohydrates chart, determine about how much carbohydrates are in your meals and snacks.  Make sure you are eating foods with fiber, protein, and healthy fat along with your carbohydrate  foods.  Foods with Carbohydrates The following table shows carbohydrate foods that have about 15 grams of carbohydrate each.  Using measuring cups, spoons, or a food scale when you first begin learning about carbohydrate counting can help you learn about the portion sizes you typically eat.  The following foods have 15 grams carbohydrate each: Grains 1 slice bread (1 ounce) 1 small tortilla (6-inch size)  large bagel (1 ounce) 1/3 cup pasta or rice (cooked)  hamburger or hot dog bun ( ounce)  cup cooked cereal  to  cup ready-to-eat cereal 2 taco shells (5-inch size) Fruit 1 small fresh fruit ( to 1 cup)  medium banana 17 small grapes (3 ounces) 1 cup melon or berries  cup canned or frozen fruit 2 tablespoons dried fruit (blueberries, cherries, cranberries, raisins)  cup unsweetened fruit juice Starchy Vegetables  cup cooked beans, peas, corn, potatoes/sweet potatoes  large baked potato (3 ounces) 1 cup acorn or butternut squash Snack Foods 3 to 6 crackers 8 potato chips or 13 tortilla chips ( ounce to 1 ounce) 3 cups popped popcorn Dairy 3/4 cup (6 ounces) nonfat plain yogurt, or yogurt with sugar-free sweetener 1 cup milk 1 cup plain rice, soy, coconut or flavored almond milk Sweets and Desserts  cup ice cream or frozen yogurt 1 tablespoon jam, jelly, pancake syrup, table sugar, or honey 2 tablespoons light pancake syrup 1 inch square of frosted cake or 2 inch square of unfrosted cake 2 small cookies (2/3 ounce each)  or  large cookie  Sometimes you'll have to estimate carbohydrate amounts if you don't know the exact recipe.  One cup of mixed foods like soups can have 1 to 2 carbohydrate servings, while some casseroles might have 2 or more servings of carbohydrate. Foods that have less than 20 calories in each serving can be counted as "free" foods.  Count 1 cup raw vegetables, or  cup cooked non-starchy vegetables as "free" foods.  If you eat 3 or more  servings at one meal, then count them as 1 carbohydrate serving.  Foods without Carbohydrates Not all foods contain carbohydrates.  Meat, some dairy, fats, non-starchy vegetables, and many beverages don't contain carbohydrate.  When you count carbohydrates, you can generally exclude chicken, pork, beef, fish, seafood, eggs, tofu, cheese, butter, sour cream, avocado, nuts, seeds, olives, mayonnaise, water, black coffee, unsweetened tea, and zero-calorie drinks.  Vegetables with no or low carbohydrate include green beans, cauliflower, tomatoes, and onions.  How much carbohydrate should I eat at each meal? Carbohydrate counting can help you plan your meals and manage your weight.  Following are some starting points for carbohydrate intake at each meal.   To Lose Weight To Maintain Weight Women: 2 - 3 carb servings 3 - 4 carb servings Men: 3 - 4 carb servings 4 - 5 carb servings Checking your blood glucose after meals will help you know if you need to adjust the timing, type, or number of carbohydrate servings in your meal plan.  Achieve and keep a healthy body weight by balancing your food intake and physical activity.  How should I plan my meals? Plan for half the food on your plate to include non-starchy vegetables, like salad greens, broccoli, or carrots.  Try to eat 3 to 5 servings of non-starchy vegetables every day.  Have a protein food at each meal.  Protein foods include chicken, fish, meat, eggs, or beans (note that beans contain carbohydrate).  These two food groups (non-starchy vegetables and proteins) are low in carbohydrate.  If you fill up your plate with these foods, you will eat less carbohydrate but still fill up your stomach.  Try to limit your carbohydrate portion to  of the plate. What fats are healthiest to eat? Diabetes increases risk for heart disease.  To help protect your heart, eat more healthy fats, such as olive oil, nuts, and avocado.  Eat less saturated fats  like butter, cream, and high-fat meats, like bacon and sausage.  Avoid trans fats, which are in all foods that list "partially hydrogenated oil" as an ingredient.  What should I drink? Choose drinks that are not sweetened with sugar. The healthiest choices are water, carbonated or seltzer waters, and tea and coffee without added sugars. Sweet drinks will make your blood glucose go up very quickly.  One serving of soda or energy drink is  cup.  It is best to drink these beverages only if your blood glucose is low. Artificially sweetened, or diet drinks, typically do not increase your blood glucose if they have zero calories in them. Read labels of beverages, as some diet drinks do have carbohydrate and will raise your blood glucose.  Label Reading Tips Read Nutrition Facts labels to find out how many grams of carbohydrate are in a food you want to eat.  Don't forget: sometimes serving sizes on the label aren't the same as how much food you are going to eat, so you may need to calculate how much carbohydrate is in the food  you are serving yourself

## 2020-05-30 NOTE — Progress Notes (Signed)
Inpatient Diabetes Program Recommendations  AACE/ADA: New Consensus Statement on Inpatient Glycemic Control (2015)  Target Ranges:  Prepandial:   less than 140 mg/dL      Peak postprandial:   less than 180 mg/dL (1-2 hours)      Critically ill patients:  140 - 180 mg/dL   Lab Results  Component Value Date   GLUCAP 181 (H) 05/30/2020   HGBA1C 10.6 (H) 05/28/2020    Review of Glycemic Control  Diabetes history: New-onset DM Outpatient Diabetes medications: None Current orders for Inpatient glycemic control: Amaryl 4 mg QD, Novolog 0-15 units tidwc and hs, metformin 750 mg bid  Inpatient Diabetes Program Recommendations:     Discharge medications:  Amaryl 4 mg QD Metformin 750 mg bid  Spoke with pt at bedside regarding his HgbA1C of 10.6% and his 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 Pt states he has read the Living Well with Diabetes book cover to cover, and understands if OHAs cannot maintain good glycemic control, he will need to be on insulin. Pt states "my diet stinks, lots of sweets and sodas," and said he has lots of room for improvement. We discussed portion control, eating more vegies, whole-grains, and lean meat, low fat dairy.Gave ideas for eating out and fast-food restaurants. Stressed importance of exercise and checking blood sugars 1-2x/day. Pt states he will call PCP when he gets home to set up appt for diabetes management. Answered questions. Pt was appreciative of visit and said he really has learned a lot about diabetes in the past 2 days.   Communicated with RN and gave pt prescription for meter and supplies.   Thank you. Ailene Ards, RD, LDN, CDE Inpatient Diabetes Coordinator [phone]

## 2020-05-31 ENCOUNTER — Other Ambulatory Visit: Payer: Self-pay | Admitting: *Deleted

## 2020-05-31 NOTE — Patient Outreach (Signed)
[facility]) Care Management  05/31/2020  Samuel Huffman 10-21-1965 [id number]   Referral received for assistance managing new onset diabetes, discharged on 12/6.  Call placed to member, state this is not a good time to talk.  State he will call this care manager back at a more convenient time.  Will send outreach letter and follow up within the next 3-4 business days.  [name], [place], MSN [facility] Care Management  [facility] Manager [phone]

## 2020-06-02 NOTE — Discharge Summary (Signed)
[facility] Discharge Summary   Patient: Samuel Huffman BSJ:[id number]  PCP: Darreld Mclean, MD  Date of admission: 05/27/2020   Date of discharge: 05/30/2020     Discharge Diagnoses:   Principal Problem:   DKA (diabetic ketoacidosis) ([place]) Active Problems:   Acute on chronic pancreatitis ([place])   History of alcohol abuse   Hyponatremia   Liver cirrhosis ([place])   Diabetes mellitus ([place])   Admitted From: home Disposition:  Home   Recommendations for Outpatient Follow-up:  1. PCP: follow up in 1 week, assess glycemic control and monitor need for insulin therapy 2. Follow up LABS/TEST:  none   Follow-up Information    Copland, Gay Filler, MD. Schedule an appointment as soon as possible for a visit.   Specialty: Family Medicine Why: monitor diabetes regimen and consider insulin if not controlled.  Contact information: [address] [place] [address] [phone]              Diet recommendation: Carb modified diet  Activity: The patient is advised to gradually reintroduce usual activities, as tolerated  Discharge Condition: stable  Code Status: Full code   History of present illness: As per the H and P dictated on admission, "Samuel Huffman is a 54 y.o. male with medical history significant of chronic and recurrent pancreatitis, remote h/o EtOH abuse, cirrhosis of liver, portal hypertensive gastropathy.  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  Pt seen in ED with c/o intractable N/V and epigastric abd pain.  Symptoms onset a couple days ago, worsening, now severe.  Usually can get symptoms under control but unable to this time.  Seen in ED initially this morning, given IVF and nausea meds.  Symptoms persisted so returned to ED this afternoon."  Hospital Course:  Summary of his active problems in the hospital is as following.   1.DKA Metabolic acidosis Betahydroxybutyric acid resolved. Anion gap  resolved. Treated with IV insulin. transitioning to basal bolus regimen. Given patient's desire to not to be on insulin on discharge we will initiate on Metformin and glimepiride.   2.Acute on chronic pancreatitis. Continues to have severe abdominal pain. Continue pain medication. Monitor.  3.Hyponatremia Corrected. Monitor.  4.Hypokalemia Corrected. Monitor.  5.New onset type 2 diabetes mellitus Last visit with her chronic pancreatitis. Hemoglobin A1c 10. Currently on basal bolus regimen Patient does not want to be on insulin. C-peptide is significantly low. Currently will attempt to see how he does with Metformin, glimepiride and lifestyle modification. Outpatient follow-up with PCP for further modification of this regimen.  Pain control  - [employer] Controlled Substance Reporting System database was reviewed. - gabapentin prescribed  - Also recommended to not to take more than prescribed Pain, Sleep and Anxiety Medications.  Patient was ambulatory without any assistance. On the day of the discharge the patient's vitals were stable, and no other acute medical condition were reported by patient. The patient was felt safe to be discharge at Home with no therapy needed on discharge.  Consultants: none Procedures: none  Discharge Exam: General: Appear in no distress, no Rash; Oral Mucosa Clear, moist. no Abnormal Neck Mass Or lumps, Conjunctiva normal  Cardiovascular: S1 and S2 Present, no Murmur Respiratory: good respiratory effort, Bilateral Air entry present and CTA, no Crackles, no wheezes Abdomen: Bowel Sound present, Soft and no tenderness Extremities: no Pedal edema Neurology: alert and oriented to time, place, and person affect appropriate. no new focal deficit  [employer]  05/28/20 0230  Weight: 81.4 kg   Vitals:   05/30/20 0409 05/30/20 1225  BP: 121/86 124/84  Pulse: 76 85  Resp: 16 18  Temp: 98 F (36.7 C) 97.9 F (36.6 C)   SpO2: 97% 99%    DISCHARGE MEDICATION: Allergies as of 05/30/2020   No Known Allergies     Medication List    STOP taking these medications   amoxicillin-clavulanate 875-125 MG tablet Commonly known as: AUGMENTIN   ferrous sulfate 325 (65 FE) MG tablet Commonly known as: FerrouSul     TAKE these medications   blood glucose meter kit and supplies Dispense based on patient and insurance preference. Use up to four times daily as directed. (FOR ICD-10 E10.9, E11.9).   gabapentin 300 MG capsule Commonly known as: NEURONTIN Take 1 capsule (300 mg total) by mouth 3 (three) times daily.   glimepiride 4 MG tablet Commonly known as: AMARYL Take 1 tablet (4 mg total) by mouth daily with breakfast.   HYDROcodone-acetaminophen 5-325 MG tablet Commonly known as: NORCO/VICODIN Take 1 tablet by mouth every 6 (six) hours as needed for moderate pain.   losartan 50 MG tablet Commonly known as: COZAAR Take 1 tablet (50 mg total) by mouth daily.   metFORMIN 750 MG 24 hr tablet Commonly known as: GLUCOPHAGE-XR Take 1 tablet (750 mg total) by mouth 2 (two) times daily with a meal.   ondansetron 4 MG disintegrating tablet Commonly known as: Zofran ODT Take 1 tablet (4 mg total) by mouth every 8 (eight) hours as needed.   pantoprazole 40 MG tablet Commonly known as: PROTONIX TAKE 1 TABLET(40 MG) BY MOUTH DAILY What changed: See the new instructions.      No Known Allergies Discharge Instructions    Ambulatory referral to Nutrition and Diabetic Education   Complete by: As directed    Diet Carb Modified   Complete by: As directed    Increase activity slowly   Complete by: As directed       The results of significant diagnostics from this hospitalization (including imaging, microbiology, ancillary and laboratory) are listed below for reference.    Significant Diagnostic Studies: CT Abdomen Pelvis W Contrast  Result Date: 05/27/2020 CLINICAL DATA:  Epigastric abdominal pain.  EXAM: CT ABDOMEN AND PELVIS WITH CONTRAST TECHNIQUE: Multidetector CT imaging of the abdomen and pelvis was performed using the standard protocol following bolus administration of intravenous contrast. CONTRAST:  153mL OMNIPAQUE IOHEXOL 300 MG/ML  SOLN COMPARISON:  May 27, 2018. FINDINGS: Lower chest: No acute abnormality. Hepatobiliary: No focal liver abnormality is seen. No gallstones, gallbladder Nhem thickening, or biliary dilatation. Pancreas: Calcifications are seen throughout the pancreas consistent with chronic pancreatitis. No ductal dilatation is noted. Some stranding of the surrounding tissues is noted around the pancreatic head suggesting possible acute inflammation. Spleen: Status post splenectomy. Stable probable splenule is seen in the left upper quadrant. Adrenals/Urinary Tract: Adrenal glands are unremarkable. Kidneys are normal, without renal calculi, focal lesion, or hydronephrosis. Bladder is unremarkable. Stomach/Bowel: Stomach is within normal limits. Appendix appears normal. No evidence of bowel Rumberger thickening, distention, or inflammatory changes. Vascular/Lymphatic: No significant vascular findings are present. No enlarged abdominal or pelvic lymph nodes. Reproductive: Prostate is unremarkable. Other: Small fat containing periumbilical hernia is noted. No ascites is noted. Musculoskeletal: No acute or significant osseous findings. IMPRESSION: 1. Findings consistent with chronic pancreatitis. Some stranding of the surrounding tissues is noted around the pancreatic head suggesting possible acute pancreatitis. Correlation with clinical and laboratory findings is recommended.  2. Small fat containing periumbilical hernia. 3. Status post splenectomy. Electronically Signed   By: Marijo Conception M.D.   On: 05/27/2020 08:56    Microbiology: Recent Results (from the past 240 hour(s))  Resp Panel by RT-PCR (Flu A&B, Covid) Nasopharyngeal Swab     Status: None   Collection Time: 05/27/20  9:44  PM   Specimen: Nasopharyngeal Swab; Nasopharyngeal(NP) swabs in vial transport medium  Result Value Ref Range Status   SARS Coronavirus 2 by RT PCR NEGATIVE NEGATIVE Final    Comment: (NOTE) SARS-CoV-2 target nucleic acids are NOT DETECTED.  The SARS-CoV-2 RNA is generally detectable in upper respiratory specimens during the acute phase of infection. The lowest concentration of SARS-CoV-2 viral copies this assay can detect is 138 copies/mL. A negative result does not preclude SARS-Cov-2 infection and should not be used as the sole basis for treatment or other patient management decisions. A negative result may occur with  improper specimen collection/handling, submission of specimen other than nasopharyngeal swab, presence of viral mutation(s) within the areas targeted by this assay, and inadequate number of viral copies(<138 copies/mL). A negative result must be combined with clinical observations, patient history, and epidemiological information. The expected result is Negative.  Fact Sheet for Patients:  EntrepreneurPulse.com.au  Fact Sheet for Healthcare Providers:  IncredibleEmployment.be  This test is no t yet approved or cleared by the [place] FDA and  has been authorized for detection and/or diagnosis of SARS-CoV-2 by FDA under an Emergency Use Authorization (EUA). This EUA will remain  in effect (meaning this test can be used) for the duration of the COVID-19 declaration under Section 564(b)(1) of the Act, 21 U.S.C.section 360bbb-3(b)(1), unless the authorization is terminated  or revoked sooner.       Influenza A by PCR NEGATIVE NEGATIVE Final   Influenza B by PCR NEGATIVE NEGATIVE Final    Comment: (NOTE) The Xpert Xpress SARS-CoV-2/FLU/RSV plus assay is intended as an aid in the diagnosis of influenza from Nasopharyngeal swab specimens and should not be used as a sole basis for treatment. Nasal washings and aspirates are  unacceptable for Xpert Xpress SARS-CoV-2/FLU/RSV testing.  Fact Sheet for Patients: EntrepreneurPulse.com.au  Fact Sheet for Healthcare Providers: IncredibleEmployment.be  This test is not yet approved or cleared by the [place] FDA and has been authorized for detection and/or diagnosis of SARS-CoV-2 by FDA under an Emergency Use Authorization (EUA). This EUA will remain in effect (meaning this test can be used) for the duration of the COVID-19 declaration under Section 564(b)(1) of the Act, 21 U.S.C. section 360bbb-3(b)(1), unless the authorization is terminated or revoked.  Performed at [facility], [address] [place], [place] [address]      Labs: CBC: Recent Labs  Lab 05/27/20 [phone] 05/27/20 2144 05/29/20 0547 05/30/20 0525  WBC 13.0* 16.4* 9.1 6.6  NEUTROABS 10.4* 14.6*  --   --   HGB 15.0 13.7 11.5* 10.5*  HCT 42.8 39.4 33.3* 31.6*  MCV 97.7 98.7 101.2* 103.9*  PLT 269 252 223 409   Basic Metabolic Panel: Recent Labs  Lab 05/28/20 0923 05/28/20 1333 05/28/20 1726 05/29/20 0547 05/30/20 0525  NA 131* 130* 131* 131* 134*  K 4.1 4.8 3.7 3.1* 3.3*  CL 100 100 98 97* 103  CO2 21* $Remov'23 24 22 22  'LEfDgh$ GLUCOSE 152* 142* 121* 117* 175*  BUN $Re'10 9 9 8 14  'ZAy$ CREATININE 0.55* 0.59* 0.50* 0.48* 0.65  CALCIUM 9.1 9.1 9.0 9.2 8.9   Liver  Function Tests: Recent Labs  Lab 05/27/20 0637 05/27/20 2144 05/29/20 0547  AST 56* 38 20  ALT 42 33 19  ALKPHOS 90 91 70  BILITOT 1.9* 2.1* 0.9  PROT 7.9 7.7 6.6  ALBUMIN 4.4 4.1 3.3*   CBG: Recent Labs  Lab 05/29/20 2036 05/29/20 2359 05/30/20 0406 05/30/20 0750 05/30/20 1221  GLUCAP 125* 186* 156* 126* 181*    Time spent: 35 minutes  Signed:  Berle Mull  [facility] 05/30/2020

## 2020-06-03 ENCOUNTER — Other Ambulatory Visit: Payer: Self-pay | Admitting: *Deleted

## 2020-06-03 NOTE — Patient Outreach (Signed)
[facility]) Care Management  06/03/2020  Samuel Huffman 14-May-1966 [id number]   Outreach attempt #2, unsuccessful, HIPAA compliant voice message left. Will follow up within the next 3-4 business days.  [name], [place], MSN [facility] Care Management  [facility] Manager [phone]

## 2020-06-10 ENCOUNTER — Other Ambulatory Visit: Payer: Self-pay | Admitting: *Deleted

## 2020-06-10 NOTE — Patient Outreach (Signed)
[facility]) Care Management  06/10/2020  Samuel Huffman 1965/10/30 [id number]   Outreach attempt #3, unsuccessful, HIPAA compliant voice message left.  Will follow up with 4th and final attempt within the next 4 weeks.  If remain unsuccessful, will close case due to inability to establish contact.  Samuel Huffman, [place], MSN [facility] Care Management  [facility] Manager [phone]

## 2020-06-10 NOTE — Progress Notes (Signed)
[facility] [address] [place], [place] [address] [phone]  Date:  06/13/2020   Name:  Samuel Huffman   DOB:  04/15/1966   MRN:  [id number]  PCP:  Darreld Mclean, MD    Chief Complaint: Hospitalization Follow-up (Pancreatitis, dx of DM. No new concerns, feels pretty good. Checks blood sugars about 4x a day. Hazel Sams a refill on Pantoprazole. )   History of Present Illness:  Samuel Huffman is a 54 y.o. 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), prediabetes with A1c 6% 11/2016  Date of admission: 05/27/2020             Date of discharge: 05/30/2020     Discharge Diagnoses:   Principal Problem:   DKA (diabetic ketoacidosis) ([place]) Active Problems:   Acute on chronic pancreatitis ([place])   History of alcohol abuse   Hyponatremia   Liver cirrhosis ([place])   Diabetes mellitus ([place])  History of present illness: As per the H and P dictated on admission, "Samuel Huffman a 54 y.o.malewith medical history significant ofchronic and recurrent pancreatitis, remote h/o EtOH abuse, cirrhosis of liver, portal hypertensive gastropathy. 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 Pt seen in ED with c/o intractable N/V and epigastric abd pain. Symptoms onset a couple days ago, worsening, now severe. Usually can get symptoms under control but unable to this time. Seen in ED initially this morning, given IVF and nausea meds. Symptoms persisted so returned to ED this afternoon." Hospital Course:  Summary of his active problems in the hospital is as following. 1.DKA Metabolic acidosis Betahydroxybutyric acidresolved. Anion  gapresolved. Treated with IV insulin. transitioning to basal bolus regimen. Given patient's desire to not to be on insulin on dischargewe will initiate on Metformin and glimepiride.  2.Acute on chronic pancreatitis. Continues to have severe abdominal pain. Continue pain medication. Monitor. 3.Hyponatremia Corrected. Monitor. 4.Hypokalemia Corrected. Monitor. 5.New onset type 2 diabetes mellitus Last visit with her chronic pancreatitis. Hemoglobin A1c 10. Currently on basal bolus regimen Patient does not want to be on insulin. C-peptide is significantly low. Currently will attempt to see how he does with Metformin, glimepiride and lifestyle modification. Outpatient follow-up with PCP for further modification of this regimen.  Pain control  - [employer] Controlled Substance Reporting System database was reviewed. - gabapentin prescribed  - Also recommended to not to take more than prescribed Pain, Sleep and Anxiety Medications.  Thankfully, Samuel Huffman is feeling much better.  His abdominal pain is resolved.  He also feels overall improved since his blood sugars are under better control. He relates that he does not know much about his family health history as he was raised in the foster system  Eye exam-recommend the patient have this done once a year Tetanus booster covid - he had one shot of J and J in October   He is checking his blood sugars about 4 times a day, he brings in his glucose meter for review today.  Generally, glucose is running less than 200  He is getting calls from his insurance nurses about his DM care, he is checking his glucose 4x a day  He is using amaryl and metformin BID  without any problems, he does not note any significant side effects Lowest glucose was 95 fasting  He is working on his diet- he has cut down on sugars, admits he tends to consume a lot of sweets and also soda  He continues to be physically active and gets plenty of  exercise  He has a DOT license-typically, DOT drivers cannot use insulin so she is quite motivated to avoid insulin use  He is eating normally, no vomiting or diarrhea, no fevers Patient Active Problem List   Diagnosis Date Noted  . Diabetes mellitus ([place]) 05/28/2020  . DKA (diabetic ketoacidosis) ([place]) 05/28/2020  . Microcytic anemia 04/10/2017  . SIRS (systemic inflammatory response syndrome) ([place]) 04/10/2017  . Liver cirrhosis ([place]) 04/10/2017  . GERD (gastroesophageal reflux disease) 04/10/2017  . Cirrhosis of liver with ascites ([place])   . S/P laparoscopy 12/21/2016  . Bleeding gastric varices   . Hyponatremia 08/04/2016  . Hypokalemia 08/04/2016  . Abnormal CT scan, stomach   . Upper GI bleed 08/02/2016  . Hematemesis 08/02/2016  . Pancreatic pseudocyst   . Pulmonary nodule   . Acute on chronic pancreatitis ([place]) 10/01/2014  . AKI (acute kidney injury) ([place]) 10/01/2014  . History of alcohol abuse 10/01/2014  . Lactic acidosis 10/01/2014  . Insomnia 07/05/2014    Past Medical History:  Diagnosis Date  . Acute upper GI bleeding 08/02/2016  . Anxiety   . Blood transfusion without reported diagnosis   . Cirrhosis of liver with ascites ([place])   . Hiatal hernia   . History of alcohol abuse   . Hypertension   . Insomnia   . Pancreatic pseudocyst   . Pancreatitis   . Portal hypertensive gastropathy ([place])   . PTSD (post-traumatic stress disorder)   . Pulmonary nodule   . Varices, gastric     Past Surgical History:  Procedure Laterality Date  . ESOPHAGOGASTRODUODENOSCOPY N/A 12/18/2016   Procedure: ESOPHAGOGASTRODUODENOSCOPY (EGD);  Surgeon: Irene Shipper, MD;  Location: [facility];  Service: Endoscopy;  Laterality: N/A;  . ESOPHAGOGASTRODUODENOSCOPY (EGD) WITH PROPOFOL N/A 08/03/2016   Procedure: ESOPHAGOGASTRODUODENOSCOPY (EGD) WITH PROPOFOL;  Surgeon: Milus Banister, MD;  Location: [facility];  Service: Endoscopy;  Laterality: N/A;  . EUS  08/03/2016   Procedure:  UPPER ENDOSCOPIC ULTRASOUND (EUS) LINEAR;  Surgeon: Milus Banister, MD;  Location: [facility];  Service: Endoscopy;;  . FEMUR SURGERY Left   . LAPAROSCOPY N/A 12/21/2016   Procedure: LAPAROSCOPY LYSIS OF ADHESIONS, EXPLORATORY LAPAROTOMY, SPLENECTOMY;  Surgeon: Jackolyn Confer, MD;  Location: [facility];  Service: General;  Laterality: N/A;  . SPLENECTOMY      Social History   Tobacco Use  . Smoking status: Current Some Day Smoker    Packs/day: 0.50    Types: Cigarettes  . Smokeless tobacco: Current User    Types: Snuff  Vaping Use  . Vaping Use: Never used  Substance Use Topics  . Alcohol use: No  . Drug use: No    Family History  Adopted: Yes  Problem Relation Age of Onset  . Healthy Son        x1    No Known Allergies  Medication list has been reviewed and updated.  Current Outpatient Medications on File Prior to Visit  Medication Sig Dispense Refill  . Accu-Chek Softclix Lancets lancets SMARTSIG:Topical    . blood glucose meter kit and supplies Dispense based on patient and insurance preference. Use up to four times daily as directed. (FOR ICD-10 E10.9, E11.9). 1 each 0  .  gabapentin (NEURONTIN) 300 MG capsule Take 1 capsule (300 mg total) by mouth 3 (three) times daily. 60 capsule 0  . glimepiride (AMARYL) 4 MG tablet Take 1 tablet (4 mg total) by mouth daily with breakfast. 30 tablet 0  . losartan (COZAAR) 50 MG tablet Take 1 tablet (50 mg total) by mouth daily. 30 tablet 0  . metFORMIN (GLUCOPHAGE-XR) 750 MG 24 hr tablet Take 1 tablet (750 mg total) by mouth 2 (two) times daily with a meal. 60 tablet 0  . pantoprazole (PROTONIX) 40 MG tablet TAKE 1 TABLET(40 MG) BY MOUTH DAILY (Patient taking differently: Take 40 mg by mouth daily.) 90 tablet 0   No current facility-administered medications on file prior to visit.    Review of Systems:  As per HPI- otherwise negative.   Physical Examination: Vitals:   06/13/20 0930  BP: 125/82  Pulse: 82  Resp: 17  SpO2:  99%   Vitals:   06/13/20 0930  Weight: 194 lb 9.6 oz (88.3 kg)   Body mass index is 24.99 kg/m. Ideal Body Weight:    GEN: no acute distress.  Normal weight, looks well HEENT: Atraumatic, Normocephalic.  Ears and Nose: No external deformity. CV: RRR, No M/G/R. No JVD. No thrill. No extra heart sounds. PULM: CTA B, no wheezes, crackles, rhonchi. No retractions. No resp. distress. No accessory muscle use. ABD: S, NT, ND, +BS. No rebound. No HSM.  Belly is benign EXTR: No c/c/e PSYCH: Normally interactive. Conversant.    Assessment and Plan: Hospital discharge follow-up  Controlled type 2 diabetes mellitus with complication, without long-term current use of insulin ([place]) - Plan: Basic metabolic panel, CBC, metFORMIN (GLUCOPHAGE-XR) 750 MG 24 hr tablet, losartan (COZAAR) 50 MG tablet, glimepiride (AMARYL) 4 MG tablet  Acute on chronic pancreatitis ([place])  H/O splenectomy  Gastroesophageal reflux disease without esophagitis - Plan: pantoprazole (PROTONIX) 40 MG tablet  Samuel Huffman is following up today for recent hospital stay.  He was admitted with pancreatitis and also found to have newly diagnosed uncontrolled diabetes  Hydration and insulin got him feeling much better, his pancreatitis symptoms have resolved  He is doing quite well on oral medication for diabetes at this time We spent some time discussing diabetes, how to manage his blood sugars, diet, exercise, and A1c testing.  I advised him that 4 times a day testing is not necessary, we went over a more reasonable blood sugar monitoring routine  Refill medications as above-blood pressure also looks fine We will plan to see each other in 3 months to check on his A1c, he will let me know sooner if any concerns This visit occurred during the SARS-CoV-2 public health emergency.  Safety protocols were in place, including screening questions prior to the visit, additional usage of staff PPE, and extensive cleaning of exam room while  observing appropriate contact time as indicated for disinfecting solutions.    Signed Lamar Blinks, MD   Received labs as below-letter to patient Results for orders placed or performed in visit on 28/00/34  Basic metabolic panel  Result Value Ref Range   Sodium 137 135 - 145 mEq/L   Potassium 4.6 3.5 - 5.1 mEq/L   Chloride 99 96 - 112 mEq/L   CO2 28 19 - 32 mEq/L   Glucose, Bld 142 (H) 70 - 99 mg/dL   BUN 18 6 - 23 mg/dL   Creatinine, Ser 0.67 0.40 - 1.50 mg/dL   GFR 106.04 >60.00 mL/min   Calcium 9.9 8.4 - 10.5 mg/dL  CBC  Result Value Ref Range   WBC 6.5 4.0 - 10.5 K/uL   RBC 3.53 (L) 4.22 - 5.81 Mil/uL   Platelets 565.0 (H) 150.0 - 400.0 K/uL   Hemoglobin 12.2 (L) 13.0 - 17.0 g/dL   HCT 35.7 (L) 39.0 - 52.0 %   MCV 101.2 (H) 78.0 - 100.0 fl   MCHC 34.1 30.0 - 36.0 g/dL   RDW 12.7 11.5 - 15.5 %

## 2020-06-13 ENCOUNTER — Ambulatory Visit (INDEPENDENT_AMBULATORY_CARE_PROVIDER_SITE_OTHER): Payer: BC Managed Care – PPO | Admitting: Family Medicine

## 2020-06-13 ENCOUNTER — Other Ambulatory Visit: Payer: Self-pay

## 2020-06-13 VITALS — BP 125/82 | HR 82 | Resp 17 | Wt 194.6 lb

## 2020-06-13 DIAGNOSIS — K861 Other chronic pancreatitis: Secondary | ICD-10-CM

## 2020-06-13 DIAGNOSIS — K859 Acute pancreatitis without necrosis or infection, unspecified: Secondary | ICD-10-CM | POA: Diagnosis not present

## 2020-06-13 DIAGNOSIS — Z9081 Acquired absence of spleen: Secondary | ICD-10-CM | POA: Diagnosis not present

## 2020-06-13 DIAGNOSIS — E118 Type 2 diabetes mellitus with unspecified complications: Secondary | ICD-10-CM | POA: Diagnosis not present

## 2020-06-13 DIAGNOSIS — Z09 Encounter for follow-up examination after completed treatment for conditions other than malignant neoplasm: Secondary | ICD-10-CM

## 2020-06-13 DIAGNOSIS — K219 Gastro-esophageal reflux disease without esophagitis: Secondary | ICD-10-CM

## 2020-06-13 LAB — CBC
HCT: 35.7 % — ABNORMAL LOW (ref 39.0–52.0)
Hemoglobin: 12.2 g/dL — ABNORMAL LOW (ref 13.0–17.0)
MCHC: 34.1 g/dL (ref 30.0–36.0)
MCV: 101.2 fl — ABNORMAL HIGH (ref 78.0–100.0)
Platelets: 565 10*3/uL — ABNORMAL HIGH (ref 150.0–400.0)
RBC: 3.53 Mil/uL — ABNORMAL LOW (ref 4.22–5.81)
RDW: 12.7 % (ref 11.5–15.5)
WBC: 6.5 10*3/uL (ref 4.0–10.5)

## 2020-06-13 LAB — BASIC METABOLIC PANEL
BUN: 18 mg/dL (ref 6–23)
CO2: 28 mEq/L (ref 19–32)
Calcium: 9.9 mg/dL (ref 8.4–10.5)
Chloride: 99 mEq/L (ref 96–112)
Creatinine, Ser: 0.67 mg/dL (ref 0.40–1.50)
GFR: 106.04 mL/min (ref >60.00)
Glucose, Bld: 142 mg/dL — ABNORMAL HIGH (ref 70–99)
Potassium: 4.6 mEq/L (ref 3.5–5.1)
Sodium: 137 mEq/L (ref 135–145)

## 2020-06-13 MED ORDER — PANTOPRAZOLE SODIUM 40 MG PO TBEC: 40.0000 mg | DELAYED_RELEASE_TABLET | Freq: Every day | ORAL | 3 refills | Status: DC

## 2020-06-13 MED ORDER — LOSARTAN POTASSIUM 50 MG PO TABS: 50.0000 mg | ORAL_TABLET | Freq: Every day | ORAL | 3 refills | Status: DC

## 2020-06-13 MED ORDER — GLIMEPIRIDE 4 MG PO TABS: 4.0000 mg | ORAL_TABLET | Freq: Every day | ORAL | 3 refills | Status: DC

## 2020-06-13 MED ORDER — METFORMIN HCL ER 750 MG PO TB24: 750.0000 mg | ORAL_TABLET | Freq: Two times a day (BID) | ORAL | 3 refills | Status: DC

## 2020-06-13 NOTE — Patient Instructions (Addendum)
It was good to see you again today. I am sorry you have been ill but so glad you are better!  Please see me in 3 months Please find an eye doctor and get an exam annually  Please check your glucose once a day- ok to vary time of day.  Looking for numbers no lower than 80 and no higher than 150 (fasting) or 225 (after eating)  If you are far off these glucose goals please let me know!  If you are consisently in range ok to stop testing on a regular basis Diet- more protein, veggies, and "slow carbs" like brown rice, oatmeal- fewer sweets and simple carbs like bread and potatoes Regular exercise also important  Please let me know what concerns come up and we will see you soon!

## 2020-07-08 ENCOUNTER — Other Ambulatory Visit: Payer: Self-pay | Admitting: *Deleted

## 2020-07-08 NOTE — Patient Outreach (Signed)
[facility]) Care Management  07/08/2020  Samuel Huffman 1965-12-20 [id number]   Outreach attempt #4, unsuccessful, HIPAA compliant voice message left.  No response from member after multiple unsuccessful outreach attempts and letter sent.  Will close case at this time due to inability to maintain contact.  Will notify member and primary MD of case closure.  [name], [place], MSN [facility] Care Management  [facility] Manager [phone]

## 2020-09-10 NOTE — Progress Notes (Deleted)
[place] at [facility] [address], [place], [place] [address] [phone]  Date:  09/12/2020   Name:  Samuel Huffman   DOB:  [birth year]   MRN:  [id number]  PCP:  Darreld Mclean, MD    Chief Complaint: No chief complaint on file.   History of Present Illness:  Samuel Huffman is a 55 y.o. 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 Diabetes mellitus ([place]) 05/28/2020  . DKA (diabetic ketoacidosis) ([place]) 05/28/2020  . Microcytic anemia 04/10/2017  . SIRS (systemic inflammatory response syndrome) ([place]) 04/10/2017  . Liver cirrhosis ([place]) 04/10/2017  . GERD (gastroesophageal reflux disease) 04/10/2017  . Cirrhosis of liver with ascites ([place])   . S/P laparoscopy 12/21/2016  . Bleeding gastric varices   . Hyponatremia 08/04/2016  . Hypokalemia 08/04/2016  . Abnormal CT scan, stomach   . Upper GI bleed 08/02/2016  . Hematemesis 08/02/2016  . Pancreatic pseudocyst   . Pulmonary nodule   . Acute on chronic pancreatitis ([place]) 10/01/2014  . AKI (acute kidney injury) ([place]) 10/01/2014  . History of alcohol abuse 10/01/2014  . Lactic acidosis 10/01/2014  . Insomnia 07/05/2014    Past Medical History:  Diagnosis Date  . Acute upper GI bleeding 08/02/2016  . Anxiety   . Blood transfusion without reported diagnosis   . Cirrhosis of liver with ascites ([place])   . Hiatal hernia   . History of alcohol abuse   . Hypertension   . Insomnia   . Pancreatic pseudocyst   . Pancreatitis   . Portal hypertensive gastropathy ([place])   . PTSD  (post-traumatic stress disorder)   . Pulmonary nodule   . Varices, gastric     Past Surgical History:  Procedure Laterality Date  . ESOPHAGOGASTRODUODENOSCOPY N/A 12/18/2016   Procedure: ESOPHAGOGASTRODUODENOSCOPY (EGD);  Surgeon: Irene Shipper, MD;  Location: [facility];  Service: Endoscopy;  Laterality: N/A;  . ESOPHAGOGASTRODUODENOSCOPY (EGD) WITH PROPOFOL N/A 08/03/2016   Procedure: ESOPHAGOGASTRODUODENOSCOPY (EGD) WITH PROPOFOL;  Surgeon: Milus Banister, MD;  Location: [facility];  Service: Endoscopy;  Laterality: N/A;  . EUS  08/03/2016   Procedure: UPPER ENDOSCOPIC ULTRASOUND (EUS) LINEAR;  Surgeon: Milus Banister, MD;  Location: [facility];  Service: Endoscopy;;  . FEMUR SURGERY Left   . LAPAROSCOPY N/A 12/21/2016   Procedure: LAPAROSCOPY LYSIS OF ADHESIONS, EXPLORATORY LAPAROTOMY, SPLENECTOMY;  Surgeon: Jackolyn Confer, MD;  Location: [facility];  Service: General;  Laterality: N/A;  . SPLENECTOMY      Social History   Tobacco Use  . Smoking status: Current Some Day Smoker    Packs/day: 0.50    Types: Cigarettes  . Smokeless tobacco: Current User    Types: Snuff  Vaping Use  . Vaping Use: Never used  Substance Use Topics  . Alcohol use: No  . Drug use: No    Family History  Adopted: Yes  Problem Relation Age of Onset  . Healthy Son        x1    No Known Allergies  Medication list has been reviewed and updated.  Current Outpatient Medications on File Prior to Visit  Medication Sig Dispense Refill  . Accu-Chek Softclix Lancets lancets SMARTSIG:Topical    . blood glucose meter kit and supplies Dispense based on patient and insurance preference. Use up to four times daily as directed. (FOR ICD-10 E10.9, E11.9). 1 each 0  . gabapentin (NEURONTIN) 300 MG capsule Take 1 capsule (300 mg total) by mouth 3 (three) times daily. 60 capsule 0  . glimepiride (AMARYL) 4 MG tablet Take 1 tablet (4 mg total) by mouth daily with breakfast. 90 tablet 3  . losartan (COZAAR) 50  MG tablet Take 1 tablet (50 mg total) by mouth daily. 90 tablet 3  . metFORMIN (GLUCOPHAGE-XR) 750 MG 24 hr tablet Take 1 tablet (750 mg total) by mouth 2 (two) times daily with a meal. 180 tablet 3  . pantoprazole (PROTONIX) 40 MG tablet Take 1 tablet (40 mg total) by mouth daily. 90 tablet 3   No current facility-administered medications on file prior to visit.    Review of Systems:  As per HPI- otherwise negative.   Physical Examination: There were no vitals filed for this visit. There were no vitals filed for this visit. There is no height or weight on file to calculate BMI. Ideal Body Weight:    GEN: no acute distress. HEENT: Atraumatic, Normocephalic.  Ears and Nose: No external deformity. CV: RRR, No M/G/R. No JVD. No thrill. No extra heart sounds. PULM: CTA B, no wheezes, crackles, rhonchi. No retractions. No resp. distress. No accessory muscle use. ABD: S, NT, ND, +BS. No rebound. No HSM. EXTR: No c/c/e PSYCH: Normally interactive. Conversant.  Foot exam  Assessment and Plan: *** This visit occurred during the SARS-CoV-2 public health emergency.  Safety protocols were in place, including screening questions prior to the visit, additional usage of staff PPE, and extensive cleaning of exam room while observing appropriate contact time as indicated for disinfecting solutions.    Signed Lamar Blinks, MD

## 2020-09-10 NOTE — Patient Instructions (Incomplete)
Good to see you again today. I will be in touch with your labs asap  ?

## 2020-09-12 ENCOUNTER — Ambulatory Visit: Payer: BC Managed Care – PPO | Admitting: Family Medicine

## 2020-09-12 DIAGNOSIS — Z9081 Acquired absence of spleen: Secondary | ICD-10-CM

## 2020-09-12 DIAGNOSIS — E118 Type 2 diabetes mellitus with unspecified complications: Secondary | ICD-10-CM

## 2020-09-12 DIAGNOSIS — Z0289 Encounter for other administrative examinations: Secondary | ICD-10-CM

## 2020-10-26 ENCOUNTER — Telehealth: Payer: Self-pay | Admitting: *Deleted

## 2020-10-26 MED ORDER — ACCU-CHEK GUIDE VI STRP: ORAL_STRIP | 12 refills | Status: DC

## 2020-10-26 MED ORDER — AMOXICILLIN-POT CLAVULANATE 875-125 MG PO TABS: 1.0000 | ORAL_TABLET | Freq: Two times a day (BID) | ORAL | 0 refills | Status: DC

## 2020-10-26 NOTE — Telephone Encounter (Signed)
Patient is calling to check the status for medication and test strips.

## 2020-10-26 NOTE — Telephone Encounter (Signed)
Contact Type Call Who Is Calling Patient / Member / Family / Caregiver Call Type Triage / Clinical Relationship To Patient Self Return Phone Number [phone] (Primary) Chief Complaint Prescription Refill or Medication Request (non symptomatic) Reason for Call Medication Question / Request Initial Comment Caller needs prescriptions refilled. Accucheck test strips and than an ongoing prescriptions for an Antibiotic Amox-calv 875mg . [facility] [phone] Additional Comment He's not sure call will come through so nurse can leave a vm to let him know if they did the refills. Translation No Disp. Time [phone] Time) Disposition Final User 10/25/2020 7:11:46 PM Send To RN Personal 12/25/2020, RN, Kaila 10/25/2020 7:26:01 PM Clinical Call Yes 12/25/2020, RN, Vear Clock

## 2020-10-26 NOTE — Telephone Encounter (Signed)
I have called pt and confirmed his request and pharmacy. He stated that he is to keep antibiotics on hand since he doesn't have a spleen. The ones that he does have are expired. Pt is also requesting the test strips. Both Rx's have been pended for provider approval since it is not on his current med list.

## 2020-11-10 ENCOUNTER — Other Ambulatory Visit (HOSPITAL_COMMUNITY): Payer: Self-pay

## 2021-03-02 ENCOUNTER — Other Ambulatory Visit (HOSPITAL_BASED_OUTPATIENT_CLINIC_OR_DEPARTMENT_OTHER): Payer: Self-pay

## 2021-03-02 ENCOUNTER — Other Ambulatory Visit: Payer: Self-pay | Admitting: Family Medicine

## 2021-06-01 ENCOUNTER — Other Ambulatory Visit: Payer: Self-pay | Admitting: Family Medicine

## 2021-06-01 DIAGNOSIS — K219 Gastro-esophageal reflux disease without esophagitis: Secondary | ICD-10-CM

## 2021-06-22 ENCOUNTER — Telehealth: Payer: Self-pay | Admitting: Family Medicine

## 2021-06-22 ENCOUNTER — Other Ambulatory Visit: Payer: Self-pay | Admitting: Family Medicine

## 2021-06-22 DIAGNOSIS — E118 Type 2 diabetes mellitus with unspecified complications: Secondary | ICD-10-CM

## 2021-06-22 NOTE — Telephone Encounter (Signed)
Pt states pharmacy denied medications sent in today and he does not understand why. He would like a call regarding this. Please advise.   metFORMIN (GLUCOPHAGE-XR) 750 MG 24 hr tablet  losartan (COZAAR) 50 MG tablet   glimepiride (AMARYL) 4 MG tablet   [id number] [place], [place] - [address] AT [facility] [address]  [address], [place] [address]  Phone:  [phone]  Fax:  [phone]

## 2021-06-23 NOTE — Telephone Encounter (Signed)
Meds were sent yesterday 06/22/21

## 2021-07-19 ENCOUNTER — Other Ambulatory Visit: Payer: Self-pay | Admitting: Family Medicine

## 2021-07-19 DIAGNOSIS — E118 Type 2 diabetes mellitus with unspecified complications: Secondary | ICD-10-CM

## 2021-07-19 NOTE — Telephone Encounter (Signed)
Called and LVM.   Pt has not been seen in over 1 year. He needs an appointment for further refills.

## 2021-07-19 NOTE — Telephone Encounter (Signed)
Pt states pharmacy has told him medications are still being denied. Pharmacy may need some clarification. Pt is worried because he cannot go w/o medication. Please advise.

## 2021-07-20 ENCOUNTER — Other Ambulatory Visit: Payer: Self-pay | Admitting: Family Medicine

## 2021-07-20 ENCOUNTER — Other Ambulatory Visit: Payer: Self-pay

## 2021-07-20 DIAGNOSIS — E118 Type 2 diabetes mellitus with unspecified complications: Secondary | ICD-10-CM

## 2021-07-20 MED ORDER — LOSARTAN POTASSIUM 50 MG PO TABS: ORAL_TABLET | ORAL | 0 refills | Status: DC

## 2021-07-20 MED ORDER — GLIMEPIRIDE 4 MG PO TABS: ORAL_TABLET | ORAL | 0 refills | Status: DC

## 2021-07-20 MED ORDER — METFORMIN HCL ER 750 MG PO TB24: ORAL_TABLET | ORAL | 0 refills | Status: DC

## 2021-07-20 NOTE — Telephone Encounter (Signed)
Scheduled pt on Monday.

## 2021-07-20 NOTE — Telephone Encounter (Signed)
30 day refills sent.

## 2021-07-24 ENCOUNTER — Ambulatory Visit: Payer: BC Managed Care – PPO | Admitting: Family Medicine

## 2021-07-24 VITALS — BP 132/80 | HR 100 | Temp 98.3°F | Resp 18 | Ht 73.0 in | Wt 199.8 lb

## 2021-07-24 DIAGNOSIS — K7030 Alcoholic cirrhosis of liver without ascites: Secondary | ICD-10-CM

## 2021-07-24 DIAGNOSIS — E118 Type 2 diabetes mellitus with unspecified complications: Secondary | ICD-10-CM | POA: Diagnosis not present

## 2021-07-24 DIAGNOSIS — D539 Nutritional anemia, unspecified: Secondary | ICD-10-CM

## 2021-07-24 DIAGNOSIS — Z13220 Encounter for screening for lipoid disorders: Secondary | ICD-10-CM

## 2021-07-24 DIAGNOSIS — Z125 Encounter for screening for malignant neoplasm of prostate: Secondary | ICD-10-CM | POA: Diagnosis not present

## 2021-07-24 DIAGNOSIS — Z23 Encounter for immunization: Secondary | ICD-10-CM | POA: Diagnosis not present

## 2021-07-24 DIAGNOSIS — K219 Gastro-esophageal reflux disease without esophagitis: Secondary | ICD-10-CM

## 2021-07-24 MED ORDER — PANTOPRAZOLE SODIUM 40 MG PO TBEC: DELAYED_RELEASE_TABLET | ORAL | 3 refills | Status: DC

## 2021-07-24 MED ORDER — METFORMIN HCL ER 750 MG PO TB24: ORAL_TABLET | ORAL | 3 refills | Status: DC

## 2021-07-24 MED ORDER — LOSARTAN POTASSIUM 50 MG PO TABS: ORAL_TABLET | ORAL | 3 refills | Status: DC

## 2021-07-24 MED ORDER — GLIMEPIRIDE 4 MG PO TABS: ORAL_TABLET | ORAL | 3 refills | Status: DC

## 2021-07-24 NOTE — Progress Notes (Addendum)
[place] at [facility], [place], [place] [address] [phone]  Date:  07/24/2021   Name:  Samuel Huffman   DOB:  06-03-1966   MRN:  [id number]  PCP:  Darreld Mclean, MD    Chief Complaint: medication follow up (Concerns/ questions: none/Flu: received in September 2022)   History of Present Illness:  Samuel Huffman is a 56 y.o. 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 as well   Patient Active Problem List   Diagnosis Date Noted   Diabetes mellitus ([place]) 05/28/2020   DKA (diabetic ketoacidosis) ([place]) 05/28/2020   Microcytic anemia 04/10/2017   SIRS (systemic inflammatory response syndrome) ([place]) 04/10/2017   Liver cirrhosis ([place]) 04/10/2017   GERD (gastroesophageal reflux disease)  04/10/2017   Cirrhosis of liver with ascites ([place])    S/P laparoscopy 12/21/2016   Bleeding gastric varices    Hyponatremia 08/04/2016   Hypokalemia 08/04/2016   Abnormal CT scan, stomach    Upper GI bleed 08/02/2016   Hematemesis 08/02/2016   Pancreatic pseudocyst    Pulmonary nodule    Acute on chronic pancreatitis ([place]) 10/01/2014   AKI (acute kidney injury) ([place]) 10/01/2014   History of alcohol abuse 10/01/2014   Lactic acidosis 10/01/2014   Insomnia 07/05/2014    Past Medical History:  Diagnosis Date   Acute upper GI bleeding 08/02/2016   Anxiety    Blood transfusion without reported diagnosis    Cirrhosis of liver with ascites ([place])    Hiatal hernia    History of alcohol abuse    Hypertension    Insomnia    Pancreatic pseudocyst    Pancreatitis    Portal hypertensive gastropathy (HCC)    PTSD (post-traumatic stress disorder)    Pulmonary nodule    Varices, gastric     Past Surgical History:  Procedure Laterality Date   ESOPHAGOGASTRODUODENOSCOPY N/A 12/18/2016   Procedure: ESOPHAGOGASTRODUODENOSCOPY (EGD);  Surgeon: Irene Shipper, MD;  Location: [facility];  Service: Endoscopy;  Laterality: N/A;   ESOPHAGOGASTRODUODENOSCOPY (EGD) WITH PROPOFOL N/A 08/03/2016   Procedure: ESOPHAGOGASTRODUODENOSCOPY (EGD) WITH PROPOFOL;  Surgeon: Milus Banister, MD;  Location: [facility];  Service: Endoscopy;  Laterality: N/A;   EUS  08/03/2016   Procedure: UPPER ENDOSCOPIC  ULTRASOUND (EUS) LINEAR;  Surgeon: Milus Banister, MD;  Location: [facility];  Service: Endoscopy;;   FEMUR SURGERY Left    LAPAROSCOPY N/A 12/21/2016   Procedure: LAPAROSCOPY LYSIS OF ADHESIONS, EXPLORATORY LAPAROTOMY, SPLENECTOMY;  Surgeon: Jackolyn Confer, MD;  [facility];  Service: General;  Laterality: N/A;   SPLENECTOMY      Social History   Tobacco Use   Smoking status: Some Days    Packs/day: 0.50    Types: Cigarettes   Smokeless tobacco: Current    Types: Snuff  Vaping Use   Vaping  Use: Never used  Substance Use Topics   Alcohol use: No   Drug use: No    Family History  Adopted: Yes  Problem Relation Age of Onset   Healthy Son        x1    No Known Allergies  Medication list has been reviewed and updated.  Current Outpatient Medications on File Prior to Visit  Medication Sig Dispense Refill   Accu-Chek Softclix Lancets lancets SMARTSIG:Topical     amoxicillin-clavulanate (AUGMENTIN) 875-125 MG tablet Take 1 tablet by mouth 2 (two) times daily. 20 tablet 0   blood glucose meter kit and supplies Dispense based on patient and insurance preference. Use up to four times daily as directed. (FOR ICD-10 E10.9, E11.9). 1 each 0   gabapentin (NEURONTIN) 300 MG capsule Take 1 capsule (300 mg total) by mouth 3 (three) times daily. 60 capsule 0   glimepiride (AMARYL) 4 MG tablet TAKE 1 TABLET(4 MG) BY MOUTH DAILY WITH BREAKFAST 90 tablet 2   glucose blood (ACCU-CHEK GUIDE) test strip Use as instructed 100 each 12   losartan (COZAAR) 50 MG tablet TAKE 1 TABLET(50 MG) BY MOUTH DAILY 90 tablet 2   metFORMIN (GLUCOPHAGE-XR) 750 MG 24 hr tablet TAKE 1 TABLET(750 MG) BY MOUTH TWICE DAILY WITH A MEAL 180 tablet 2   pantoprazole (PROTONIX) 40 MG tablet TAKE 1 TABLET(40 MG) BY MOUTH DAILY 90 tablet 0   No current facility-administered medications on file prior to visit.    Review of Systems:  As per HPI- otherwise negative.   Physical Examination: Vitals:   07/24/21 1610  BP: 132/80  Pulse: 100  Resp: 18  Temp: 98.3 F (36.8 C)  SpO2: 96%   Vitals:   07/24/21 1610  Weight: 199 lb 12.8 oz (90.6 kg)  Height: _0  (1.854 m)   Body mass index is 26.36 kg/m. Ideal Body Weight: Weight in (lb) to have BMI = 25: 189.1  GEN: no acute distress. HEENT: Atraumatic, Normocephalic.  Ears and Nose: No external deformity. CV: RRR, No M/G/R. No JVD. No thrill. No extra heart sounds. PULM: CTA B, no wheezes, crackles, rhonchi. No retractions. No resp. distress. No  accessory muscle use. ABD: S, NT, ND, +BS. No rebound. No HSM. EXTR: No c/c/e PSYCH: Normally interactive. Conversant.  Looks well, normal weight Foot exam normal  Assessment and Plan: Controlled type 2 diabetes mellitus with complication, without long-term current use of insulin (HCC) - Plan: Comprehensive metabolic panel, Hemoglobin A1c, glimepiride (AMARYL) 4 MG tablet, losartan (COZAAR) 50 MG tablet, metFORMIN (GLUCOPHAGE-XR) 750 MG 24 hr tablet  Screening for prostate cancer - Plan: PSA  Screening for hyperlipidemia - Plan: Lipid panel  Alcoholic cirrhosis of liver without ascites ([place]) - Plan: CBC, Comprehensive metabolic panel  Gastroesophageal reflux disease without esophagitis - Plan: pantoprazole (PROTONIX) 40 MG tablet  Immunization due - Plan: Tdap vaccine greater than or equal to 7yo IM  Patient  seen today for follow-up, he is somewhat overdue Labs are pending as above, refill medications Updated Tdap Discussed Shingrix, he is interested in having this done.  However plans to do when he does not have work the next day When received labs, will remind patient in lab letter that he needs a Pneumovax in 2024, and meningococcal in 2024 as well-----------------------  Procedures/Guidelines1,2,3,6,7,8 The regimen consists of 4 vaccines initially, followed by repeat doses as specified: 1. Haemophilus b conjugate (Hib) vaccine (ACTHIB) IM once if they have not previously received Hib vaccine 2. Pneumococcal conjugate 13-valent (PCV13) vaccine (PREVNAR 13) IM once  2 nd dose: Pneumococcal polysaccharide 23-valent (PPSV23) vaccine (PNEUMOVAX 23) SQ/IM once given ? 8 weeks later, then 3rd dose as PPSV23 > 5 years later. 4 Note: The above is valid for those who have not received any pneumococcal vaccines previously, or those with unknown vaccination history. If already received prior doses of PPSV23: give PCV13 at least 1 year after last PPSV23 dose. 3. Meningococcal conjugate  vaccine (MenACWY-CRM, MENVEO) IM (repeat in ? 8 weeks, then every 5 years thereafter) 4. Meningococcal serogroup B vaccine (MenB, BEXSERO) IM (repeat in ? 4 weeks  Signed Lamar Blinks, MD  Received his labs 1/31- critical calcium This is very unexpected, we have not seen this for him in the past.  Called patient right away, he notes he is feeling well and has no particular symptoms.  We are somewhat suspicious this may be a spurious result, but I explained to patient if his calcium is truly this high it is an emergency situation which can cause cardiac arrhythmia.  I asked him to come in for redraw or go to the ER straightaway.  However, he states he is in [place] for work and is unable to be seen right this moment.  I did encourage him to be seen at an urgent care or emergency room near his location.  However, if he is not able to do this he hopes he will be back in town tomorrow and we can redraw a stat calcium and PTH Results for orders placed or performed in visit on 07/24/21  CBC  Result Value Ref Range   WBC 9.4 4.0 - 10.5 K/uL   RBC 3.62 (L) 4.22 - 5.81 Mil/uL   Platelets 396.0 150.0 - 400.0 K/uL   Hemoglobin 12.0 (L) 13.0 - 17.0 g/dL   HCT 36.3 (L) 39.0 - 52.0 %   MCV 100.2 (H) 78.0 - 100.0 fl   MCHC 33.1 30.0 - 36.0 g/dL   RDW 12.7 11.5 - 15.5 %  Comprehensive metabolic panel  Result Value Ref Range   Sodium 133 (L) 135 - 145 mEq/L   Potassium 5.1 3.5 - 5.1 mEq/L   Chloride 89 (L) 96 - 112 mEq/L   CO2 39 (H) 19 - 32 mEq/L   Glucose, Bld 215 (H) 70 - 99 mg/dL   BUN 22 6 - 23 mg/dL   Creatinine, Ser 1.30 0.40 - 1.50 mg/dL   Total Bilirubin 0.6 0.2 - 1.2 mg/dL   Alkaline Phosphatase 51 39 - 117 U/L   AST 21 0 - 37 U/L   ALT 14 0 - 53 U/L   Total Protein 7.5 6.0 - 8.3 g/dL   Albumin 4.6 3.5 - 5.2 g/dL   GFR 61.90 >60.00 mL/min   Calcium 16.3 (HH) 8.4 - 10.5 mg/dL  Hemoglobin A1c  Result Value Ref Range   Hgb A1c MFr Bld 7.4 (H) 4.6 - 6.5 %  Lipid panel   Result Value Ref Range   Cholesterol 247 (H) 0 - 200 mg/dL   Triglycerides 203.0 (H) 0.0 - 149.0 mg/dL   HDL 80.30 >39.00 mg/dL   VLDL 40.6 (H) 0.0 - 40.0 mg/dL   Total CHOL/HDL Ratio 3    NonHDL 166.22   PSA  Result Value Ref Range   PSA 1.13 0.10 - 4.00 ng/mL  LDL cholesterol, direct  Result Value Ref Range   Direct LDL 132.0 mg/dL   Called pt 2/1- he is coming in for labs today   Addendum 2/2, received repeat labs.  Calcium is still elevated but down to 12.3.  Patient had earlier admitted to using a whole lot of times for reflux recently, it is possible that his elevated calcium was simply due to excessive use of antacids.  PTH is low which is appropriate  Called patient to discuss- he is feeling well and not using tums.   Asked him to decrease dietary potassium due to moderately elevated potassium He will come in for a repeat BMP and PTH, as well as B12 and folate (due to mild macrocytic anemia) in 2-3 weeks

## 2021-07-24 NOTE — Patient Instructions (Addendum)
It was good to see you again today!  I will be in touch with your labs asap Tetanus given today You might want to also get the shingles vaccine at your convenience- this is a 2 dose series given 2-6 months apart Assuming your labs look good please see me in about 6 months

## 2021-07-25 ENCOUNTER — Telehealth: Payer: Self-pay

## 2021-07-25 LAB — CBC
HCT: 36.3 % — ABNORMAL LOW (ref 39.0–52.0)
Hemoglobin: 12 g/dL — ABNORMAL LOW (ref 13.0–17.0)
MCHC: 33.1 g/dL (ref 30.0–36.0)
MCV: 100.2 fl — ABNORMAL HIGH (ref 78.0–100.0)
Platelets: 396 10*3/uL (ref 150.0–400.0)
RBC: 3.62 Mil/uL — ABNORMAL LOW (ref 4.22–5.81)
RDW: 12.7 % (ref 11.5–15.5)
WBC: 9.4 10*3/uL (ref 4.0–10.5)

## 2021-07-25 LAB — COMPREHENSIVE METABOLIC PANEL
ALT: 14 U/L (ref 0–53)
AST: 21 U/L (ref 0–37)
Albumin: 4.6 g/dL (ref 3.5–5.2)
Alkaline Phosphatase: 51 U/L (ref 39–117)
BUN: 22 mg/dL (ref 6–23)
CO2: 39 mEq/L — ABNORMAL HIGH (ref 19–32)
Calcium: 16.3 mg/dL (ref 8.4–10.5)
Chloride: 89 mEq/L — ABNORMAL LOW (ref 96–112)
Creatinine, Ser: 1.3 mg/dL (ref 0.40–1.50)
GFR: 61.9 mL/min (ref >60.00)
Glucose, Bld: 215 mg/dL — ABNORMAL HIGH (ref 70–99)
Potassium: 5.1 mEq/L (ref 3.5–5.1)
Sodium: 133 mEq/L — ABNORMAL LOW (ref 135–145)
Total Bilirubin: 0.6 mg/dL (ref 0.2–1.2)
Total Protein: 7.5 g/dL (ref 6.0–8.3)

## 2021-07-25 LAB — LDL CHOLESTEROL, DIRECT: Direct LDL: 132 mg/dL

## 2021-07-25 LAB — LIPID PANEL
Cholesterol: 247 mg/dL — ABNORMAL HIGH (ref 0–200)
HDL: 80.3 mg/dL (ref >39.00)
NonHDL: 166.22
Total CHOL/HDL Ratio: 3
Triglycerides: 203 mg/dL — ABNORMAL HIGH (ref 0.0–149.0)
VLDL: 40.6 mg/dL — ABNORMAL HIGH (ref 0.0–40.0)

## 2021-07-25 LAB — PSA: PSA: 1.13 ng/mL (ref 0.10–4.00)

## 2021-07-25 LAB — HEMOGLOBIN A1C: Hgb A1c MFr Bld: 7.4 % — ABNORMAL HIGH (ref 4.6–6.5)

## 2021-07-25 NOTE — Telephone Encounter (Signed)
Please advise in JC absence.

## 2021-07-25 NOTE — Telephone Encounter (Signed)
I am handling, have called patient and we are getting repeat labs

## 2021-07-25 NOTE — Addendum Note (Signed)
Addended by: Abbe Amsterdam C on: 07/25/2021 12:31 PM   Modules accepted: Orders

## 2021-07-25 NOTE — Telephone Encounter (Signed)
CRITICAL VALUE STICKER  CRITICAL VALUE: Calcium 16.3  RECEIVER (on-site recipient of call): Earsie Humm, RMA  DATE & TIME NOTIFIED: 07/25/2021 at 10:50 am  MESSENGER (representative from lab): Samuel Jester  MD NOTIFIED: Abbe [place], MD and Seabron Spates, DO  TIME OF NOTIFICATION: 10:54 am  RESPONSE:

## 2021-07-26 ENCOUNTER — Other Ambulatory Visit (INDEPENDENT_AMBULATORY_CARE_PROVIDER_SITE_OTHER): Payer: BC Managed Care – PPO

## 2021-07-26 NOTE — Telephone Encounter (Signed)
Pt scheduled for rpt labs.

## 2021-07-27 LAB — COMPREHENSIVE METABOLIC PANEL
ALT: 19 U/L (ref 0–53)
AST: 27 U/L (ref 0–37)
Albumin: 4.5 g/dL (ref 3.5–5.2)
Alkaline Phosphatase: 56 U/L (ref 39–117)
BUN: 22 mg/dL (ref 6–23)
CO2: 35 mEq/L — ABNORMAL HIGH (ref 19–32)
Calcium: 12.3 mg/dL — ABNORMAL HIGH (ref 8.4–10.5)
Chloride: 96 mEq/L (ref 96–112)
Creatinine, Ser: 1.35 mg/dL (ref 0.40–1.50)
GFR: 59.16 mL/min — ABNORMAL LOW (ref >60.00)
Glucose, Bld: 209 mg/dL — ABNORMAL HIGH (ref 70–99)
Potassium: 5.4 mEq/L — ABNORMAL HIGH (ref 3.5–5.1)
Sodium: 136 mEq/L (ref 135–145)
Total Bilirubin: 0.3 mg/dL (ref 0.2–1.2)
Total Protein: 7.4 g/dL (ref 6.0–8.3)

## 2021-07-27 LAB — PARATHYROID HORMONE, INTACT (NO CA): PTH: 7 pg/mL — ABNORMAL LOW (ref 16–77)

## 2021-07-27 NOTE — Addendum Note (Signed)
Addended by: Abbe Amsterdam C on: 07/27/2021 02:41 PM   Modules accepted: Orders

## 2021-09-12 ENCOUNTER — Ambulatory Visit (INDEPENDENT_AMBULATORY_CARE_PROVIDER_SITE_OTHER): Payer: BC Managed Care – PPO | Admitting: *Deleted

## 2021-09-12 DIAGNOSIS — Z23 Encounter for immunization: Secondary | ICD-10-CM | POA: Diagnosis not present

## 2021-09-12 NOTE — Progress Notes (Signed)
Patient here for 1st shingles vaccine per Dr. Patsy Lager. ? ?Vaccine given in left deltoid and patient tolerated well. ?

## 2021-12-17 ENCOUNTER — Emergency Department (HOSPITAL_BASED_OUTPATIENT_CLINIC_OR_DEPARTMENT_OTHER)
Admission: EM | Admit: 2021-12-17 | Discharge: 2021-12-17 | Disposition: A | Discharge status: Home / Self Care | Payer: BC Managed Care – PPO | Attending: Emergency Medicine | Admitting: Emergency Medicine

## 2021-12-17 ENCOUNTER — Encounter (HOSPITAL_BASED_OUTPATIENT_CLINIC_OR_DEPARTMENT_OTHER): Payer: Self-pay | Admitting: Emergency Medicine

## 2021-12-17 ENCOUNTER — Emergency Department (HOSPITAL_BASED_OUTPATIENT_CLINIC_OR_DEPARTMENT_OTHER): Payer: BC Managed Care – PPO

## 2021-12-17 ENCOUNTER — Other Ambulatory Visit: Payer: Self-pay

## 2021-12-17 DIAGNOSIS — D72829 Elevated white blood cell count, unspecified: Secondary | ICD-10-CM | POA: Insufficient documentation

## 2021-12-17 DIAGNOSIS — R000 Tachycardia, unspecified: Secondary | ICD-10-CM | POA: Insufficient documentation

## 2021-12-17 DIAGNOSIS — K2980 Duodenitis without bleeding: Secondary | ICD-10-CM | POA: Insufficient documentation

## 2021-12-17 DIAGNOSIS — K760 Fatty (change of) liver, not elsewhere classified: Secondary | ICD-10-CM | POA: Diagnosis not present

## 2021-12-17 DIAGNOSIS — R112 Nausea with vomiting, unspecified: Secondary | ICD-10-CM

## 2021-12-17 DIAGNOSIS — K861 Other chronic pancreatitis: Secondary | ICD-10-CM | POA: Insufficient documentation

## 2021-12-17 DIAGNOSIS — R110 Nausea: Secondary | ICD-10-CM | POA: Diagnosis not present

## 2021-12-17 DIAGNOSIS — I10 Essential (primary) hypertension: Secondary | ICD-10-CM | POA: Insufficient documentation

## 2021-12-17 DIAGNOSIS — R7989 Other specified abnormal findings of blood chemistry: Secondary | ICD-10-CM | POA: Diagnosis not present

## 2021-12-17 HISTORY — DX: Type 2 diabetes mellitus without complications: E11.9

## 2021-12-17 LAB — CBC WITH DIFFERENTIAL/PLATELET
Abs Immature Granulocytes: 0.07 10*3/uL (ref 0.00–0.07)
Basophils Absolute: 0.1 10*3/uL (ref 0.0–0.1)
Basophils Relative: 1 %
Eosinophils Absolute: 0.1 10*3/uL (ref 0.0–0.5)
Eosinophils Relative: 1 %
HCT: 40.3 % (ref 39.0–52.0)
Hemoglobin: 13.5 g/dL (ref 13.0–17.0)
Immature Granulocytes: 1 %
Lymphocytes Relative: 10 %
Lymphs Abs: 1.4 10*3/uL (ref 0.7–4.0)
MCH: 33.8 pg (ref 26.0–34.0)
MCHC: 33.5 g/dL (ref 30.0–36.0)
MCV: 101 fL — ABNORMAL HIGH (ref 80.0–100.0)
Monocytes Absolute: 0.8 10*3/uL (ref 0.1–1.0)
Monocytes Relative: 6 %
Neutro Abs: 11.7 10*3/uL — ABNORMAL HIGH (ref 1.7–7.7)
Neutrophils Relative %: 81 %
Platelets: 379 10*3/uL (ref 150–400)
RBC: 3.99 MIL/uL — ABNORMAL LOW (ref 4.22–5.81)
RDW: 13.1 % (ref 11.5–15.5)
WBC: 14.3 10*3/uL — ABNORMAL HIGH (ref 4.0–10.5)
nRBC: 0 % (ref 0.0–0.2)

## 2021-12-17 LAB — COMPREHENSIVE METABOLIC PANEL
ALT: 23 U/L (ref 0–44)
AST: 30 U/L (ref 15–41)
Albumin: 4.9 g/dL (ref 3.5–5.0)
Alkaline Phosphatase: 51 U/L (ref 38–126)
Anion gap: 17 — ABNORMAL HIGH (ref 5–15)
BUN: 27 mg/dL — ABNORMAL HIGH (ref 6–20)
CO2: 22 mmol/L (ref 22–32)
Calcium: 10.1 mg/dL (ref 8.9–10.3)
Chloride: 98 mmol/L (ref 98–111)
Creatinine, Ser: 0.98 mg/dL (ref 0.61–1.24)
GFR, Estimated: >60 mL/min (ref >60)
Glucose, Bld: 128 mg/dL — ABNORMAL HIGH (ref 70–99)
Potassium: 4.8 mmol/L (ref 3.5–5.1)
Sodium: 137 mmol/L (ref 135–145)
Total Bilirubin: 0.8 mg/dL (ref 0.3–1.2)
Total Protein: 8.7 g/dL — ABNORMAL HIGH (ref 6.5–8.1)

## 2021-12-17 LAB — LIPASE, BLOOD: Lipase: 88 U/L — ABNORMAL HIGH (ref 11–51)

## 2021-12-17 LAB — LACTIC ACID, PLASMA: Lactic Acid, Venous: 1.9 mmol/L (ref 0.5–1.9)

## 2021-12-17 MED ORDER — HYDROCODONE-ACETAMINOPHEN 5-325 MG PO TABS: 1.0000 | ORAL_TABLET | Freq: Four times a day (QID) | ORAL | 0 refills | Status: DC | PRN

## 2021-12-17 MED ORDER — SODIUM CHLORIDE 0.9 % IV SOLN: 1000.0000 mL | INTRAVENOUS | Status: DC

## 2021-12-17 MED ORDER — IOHEXOL 300 MG/ML  SOLN
100.0000 mL | Freq: Once | INTRAMUSCULAR | Status: AC | PRN
Start: 2021-12-17 — End: 2021-12-17
  Administered 2021-12-17: 100 mL via INTRAVENOUS

## 2021-12-17 MED ORDER — LACTATED RINGERS IV BOLUS
1000.0000 mL | Freq: Once | INTRAVENOUS | Status: AC
  Administered 2021-12-17: 1000 mL via INTRAVENOUS

## 2021-12-17 MED ORDER — HYDROMORPHONE HCL 1 MG/ML IJ SOLN
1.0000 mg | Freq: Once | INTRAMUSCULAR | Status: AC
  Administered 2021-12-17: 1 mg via INTRAVENOUS
  Filled 2021-12-17: qty 1

## 2021-12-17 MED ORDER — ONDANSETRON 8 MG PO TBDP: 8.0000 mg | ORAL_TABLET | Freq: Three times a day (TID) | ORAL | 0 refills | Status: DC | PRN

## 2021-12-17 MED ORDER — SODIUM CHLORIDE 0.9 % IV BOLUS (SEPSIS): 1000.0000 mL | Freq: Once | INTRAVENOUS | Status: DC

## 2021-12-17 MED ORDER — PANTOPRAZOLE SODIUM 40 MG IV SOLR
40.0000 mg | Freq: Once | INTRAVENOUS | Status: AC
Start: 2021-12-17 — End: 2021-12-17
  Administered 2021-12-17: 40 mg via INTRAVENOUS
  Filled 2021-12-17: qty 10

## 2021-12-17 MED ORDER — PANTOPRAZOLE SODIUM 40 MG PO TBEC: 40.0000 mg | DELAYED_RELEASE_TABLET | Freq: Every day | ORAL | 0 refills | Status: DC

## 2021-12-17 MED ORDER — ONDANSETRON HCL 4 MG/2ML IJ SOLN
4.0000 mg | Freq: Once | INTRAMUSCULAR | Status: AC
  Administered 2021-12-17: 4 mg via INTRAVENOUS
  Filled 2021-12-17: qty 2

## 2021-12-17 NOTE — ED Triage Notes (Signed)
Reports hx of pancreatitis.  Having abdominal pain and n/v since yesterday morning.

## 2021-12-18 ENCOUNTER — Encounter (HOSPITAL_BASED_OUTPATIENT_CLINIC_OR_DEPARTMENT_OTHER): Payer: Self-pay | Admitting: Emergency Medicine

## 2021-12-18 ENCOUNTER — Emergency Department (HOSPITAL_BASED_OUTPATIENT_CLINIC_OR_DEPARTMENT_OTHER)
Admission: EM | Admit: 2021-12-18 | Discharge: 2021-12-18 | Disposition: A | Discharge status: Home / Self Care | Payer: BC Managed Care – PPO | Source: Home / Self Care | Attending: Emergency Medicine | Admitting: Emergency Medicine

## 2021-12-18 ENCOUNTER — Other Ambulatory Visit: Payer: Self-pay

## 2021-12-18 ENCOUNTER — Telehealth: Payer: Self-pay | Admitting: Family Medicine

## 2021-12-18 DIAGNOSIS — N179 Acute kidney failure, unspecified: Secondary | ICD-10-CM | POA: Diagnosis not present

## 2021-12-18 DIAGNOSIS — Z79899 Other long term (current) drug therapy: Secondary | ICD-10-CM | POA: Diagnosis not present

## 2021-12-18 DIAGNOSIS — E785 Hyperlipidemia, unspecified: Secondary | ICD-10-CM | POA: Diagnosis not present

## 2021-12-18 DIAGNOSIS — K859 Acute pancreatitis without necrosis or infection, unspecified: Secondary | ICD-10-CM | POA: Diagnosis not present

## 2021-12-18 DIAGNOSIS — Z9081 Acquired absence of spleen: Secondary | ICD-10-CM | POA: Diagnosis not present

## 2021-12-18 DIAGNOSIS — K7460 Unspecified cirrhosis of liver: Secondary | ICD-10-CM | POA: Diagnosis not present

## 2021-12-18 DIAGNOSIS — R000 Tachycardia, unspecified: Secondary | ICD-10-CM | POA: Diagnosis not present

## 2021-12-18 DIAGNOSIS — F17210 Nicotine dependence, cigarettes, uncomplicated: Secondary | ICD-10-CM | POA: Diagnosis present

## 2021-12-18 DIAGNOSIS — F4310 Post-traumatic stress disorder, unspecified: Secondary | ICD-10-CM | POA: Diagnosis not present

## 2021-12-18 DIAGNOSIS — K3189 Other diseases of stomach and duodenum: Secondary | ICD-10-CM | POA: Diagnosis present

## 2021-12-18 DIAGNOSIS — K861 Other chronic pancreatitis: Secondary | ICD-10-CM | POA: Diagnosis not present

## 2021-12-18 DIAGNOSIS — K2980 Duodenitis without bleeding: Secondary | ICD-10-CM | POA: Diagnosis not present

## 2021-12-18 DIAGNOSIS — Z86718 Personal history of other venous thrombosis and embolism: Secondary | ICD-10-CM | POA: Diagnosis not present

## 2021-12-18 DIAGNOSIS — F1011 Alcohol abuse, in remission: Secondary | ICD-10-CM | POA: Diagnosis not present

## 2021-12-18 DIAGNOSIS — E876 Hypokalemia: Secondary | ICD-10-CM | POA: Diagnosis not present

## 2021-12-18 DIAGNOSIS — K760 Fatty (change of) liver, not elsewhere classified: Secondary | ICD-10-CM | POA: Diagnosis not present

## 2021-12-18 DIAGNOSIS — I10 Essential (primary) hypertension: Secondary | ICD-10-CM | POA: Diagnosis not present

## 2021-12-18 DIAGNOSIS — E860 Dehydration: Secondary | ICD-10-CM | POA: Diagnosis not present

## 2021-12-18 DIAGNOSIS — E1165 Type 2 diabetes mellitus with hyperglycemia: Secondary | ICD-10-CM | POA: Diagnosis not present

## 2021-12-18 DIAGNOSIS — K766 Portal hypertension: Secondary | ICD-10-CM | POA: Diagnosis not present

## 2021-12-18 DIAGNOSIS — D539 Nutritional anemia, unspecified: Secondary | ICD-10-CM | POA: Diagnosis not present

## 2021-12-18 DIAGNOSIS — Z7984 Long term (current) use of oral hypoglycemic drugs: Secondary | ICD-10-CM | POA: Diagnosis not present

## 2021-12-18 LAB — CBC WITH DIFFERENTIAL/PLATELET
Abs Immature Granulocytes: 0.05 10*3/uL (ref 0.00–0.07)
Basophils Absolute: 0 10*3/uL (ref 0.0–0.1)
Basophils Relative: 0 %
Eosinophils Absolute: 0.1 10*3/uL (ref 0.0–0.5)
Eosinophils Relative: 1 %
HCT: 36.1 % — ABNORMAL LOW (ref 39.0–52.0)
Hemoglobin: 12.5 g/dL — ABNORMAL LOW (ref 13.0–17.0)
Immature Granulocytes: 1 %
Lymphocytes Relative: 10 %
Lymphs Abs: 1 10*3/uL (ref 0.7–4.0)
MCH: 34.3 pg — ABNORMAL HIGH (ref 26.0–34.0)
MCHC: 34.6 g/dL (ref 30.0–36.0)
MCV: 99.2 fL (ref 80.0–100.0)
Monocytes Absolute: 0.9 10*3/uL (ref 0.1–1.0)
Monocytes Relative: 10 %
Neutro Abs: 7.5 10*3/uL (ref 1.7–7.7)
Neutrophils Relative %: 78 %
Platelets: 332 10*3/uL (ref 150–400)
RBC: 3.64 MIL/uL — ABNORMAL LOW (ref 4.22–5.81)
RDW: 13 % (ref 11.5–15.5)
WBC: 9.5 10*3/uL (ref 4.0–10.5)
nRBC: 0 % (ref 0.0–0.2)

## 2021-12-18 LAB — URINALYSIS, ROUTINE W REFLEX MICROSCOPIC
Glucose, UA: NEGATIVE mg/dL
Hgb urine dipstick: NEGATIVE
Ketones, ur: 80 mg/dL — AB
Leukocytes,Ua: NEGATIVE
Nitrite: NEGATIVE
Protein, ur: 100 mg/dL — AB
Specific Gravity, Urine: >=1.03 (ref 1.005–1.030)
pH: 6 (ref 5.0–8.0)

## 2021-12-18 LAB — LIPASE, BLOOD: Lipase: 131 U/L — ABNORMAL HIGH (ref 11–51)

## 2021-12-18 LAB — URINALYSIS, MICROSCOPIC (REFLEX)
RBC / HPF: NONE SEEN RBC/hpf (ref 0–5)
WBC, UA: NONE SEEN WBC/hpf (ref 0–5)

## 2021-12-18 LAB — COMPREHENSIVE METABOLIC PANEL
ALT: 18 U/L (ref 0–44)
AST: 26 U/L (ref 15–41)
Albumin: 4.5 g/dL (ref 3.5–5.0)
Alkaline Phosphatase: 47 U/L (ref 38–126)
Anion gap: 14 (ref 5–15)
BUN: 23 mg/dL — ABNORMAL HIGH (ref 6–20)
CO2: 25 mmol/L (ref 22–32)
Calcium: 9.8 mg/dL (ref 8.9–10.3)
Chloride: 95 mmol/L — ABNORMAL LOW (ref 98–111)
Creatinine, Ser: 0.9 mg/dL (ref 0.61–1.24)
GFR, Estimated: >60 mL/min (ref >60)
Glucose, Bld: 214 mg/dL — ABNORMAL HIGH (ref 70–99)
Potassium: 3.6 mmol/L (ref 3.5–5.1)
Sodium: 134 mmol/L — ABNORMAL LOW (ref 135–145)
Total Bilirubin: 0.6 mg/dL (ref 0.3–1.2)
Total Protein: 8.3 g/dL — ABNORMAL HIGH (ref 6.5–8.1)

## 2021-12-18 LAB — CBG MONITORING, ED: Glucose-Capillary: 199 mg/dL — ABNORMAL HIGH (ref 70–99)

## 2021-12-18 MED ORDER — SODIUM CHLORIDE 0.9 % IV BOLUS
2000.0000 mL | Freq: Once | INTRAVENOUS | Status: AC
  Administered 2021-12-18: 2000 mL via INTRAVENOUS

## 2021-12-18 MED ORDER — ONDANSETRON 4 MG PO TBDP
4.0000 mg | ORAL_TABLET | Freq: Once | ORAL | Status: AC | PRN
  Administered 2021-12-18: 4 mg via ORAL
  Filled 2021-12-18: qty 1

## 2021-12-18 MED ORDER — ONDANSETRON 8 MG PO TBDP: 8.0000 mg | ORAL_TABLET | Freq: Three times a day (TID) | ORAL | 0 refills | Status: DC | PRN

## 2021-12-18 MED ORDER — HYDROCODONE-ACETAMINOPHEN 5-325 MG PO TABS: 1.0000 | ORAL_TABLET | Freq: Four times a day (QID) | ORAL | 0 refills | Status: DC | PRN

## 2021-12-18 MED ORDER — FAMOTIDINE IN NACL 20-0.9 MG/50ML-% IV SOLN
20.0000 mg | Freq: Once | INTRAVENOUS | Status: AC
  Administered 2021-12-18: 20 mg via INTRAVENOUS
  Filled 2021-12-18: qty 50

## 2021-12-18 MED ORDER — HYDROMORPHONE HCL 1 MG/ML IJ SOLN
2.0000 mg | Freq: Once | INTRAMUSCULAR | Status: AC
  Administered 2021-12-18: 2 mg via INTRAVENOUS
  Filled 2021-12-18: qty 2

## 2021-12-18 MED ORDER — PROCHLORPERAZINE EDISYLATE 10 MG/2ML IJ SOLN
10.0000 mg | Freq: Once | INTRAMUSCULAR | Status: AC
  Administered 2021-12-18: 10 mg via INTRAVENOUS
  Filled 2021-12-18: qty 2

## 2021-12-18 MED ORDER — DIPHENHYDRAMINE HCL 50 MG/ML IJ SOLN
25.0000 mg | Freq: Once | INTRAMUSCULAR | Status: AC
  Administered 2021-12-18: 25 mg via INTRAVENOUS
  Filled 2021-12-18: qty 1

## 2021-12-18 MED ORDER — ONDANSETRON HCL 4 MG/2ML IJ SOLN
4.0000 mg | Freq: Once | INTRAMUSCULAR | Status: AC
  Administered 2021-12-18: 4 mg via INTRAVENOUS
  Filled 2021-12-18: qty 2

## 2021-12-18 MED ORDER — PANTOPRAZOLE SODIUM 40 MG IV SOLR
40.0000 mg | Freq: Once | INTRAVENOUS | Status: AC
  Administered 2021-12-18: 40 mg via INTRAVENOUS
  Filled 2021-12-18: qty 10

## 2021-12-18 NOTE — ED Notes (Signed)
Pt. Reports he was here yesterday and back today for the same.  Pt. Reports abd. Pain with pancreatitis history.  Pt. States he is here because he started vomiting today and is unable to keep any food or water down.  Pt. Reports he has vomited x 12 today.  Pt. In no distress but does complain of pain and is dry heaving upon assessment.

## 2021-12-19 NOTE — Telephone Encounter (Signed)
Pt says he is feeling better. He was discharged this morning and will come see [place] on 7/31 as scheduled.

## 2021-12-20 ENCOUNTER — Inpatient Hospital Stay (HOSPITAL_BASED_OUTPATIENT_CLINIC_OR_DEPARTMENT_OTHER)
Admission: EM | Admit: 2021-12-20 | Discharge: 2021-12-23 | DRG: 439 | Disposition: A | Discharge status: Home / Self Care | Payer: BC Managed Care – PPO | Attending: Internal Medicine | Admitting: Internal Medicine

## 2021-12-20 ENCOUNTER — Encounter (HOSPITAL_BASED_OUTPATIENT_CLINIC_OR_DEPARTMENT_OTHER): Payer: Self-pay | Admitting: Emergency Medicine

## 2021-12-20 ENCOUNTER — Other Ambulatory Visit: Payer: Self-pay

## 2021-12-20 DIAGNOSIS — E876 Hypokalemia: Secondary | ICD-10-CM | POA: Diagnosis present

## 2021-12-20 DIAGNOSIS — E860 Dehydration: Secondary | ICD-10-CM | POA: Diagnosis present

## 2021-12-20 DIAGNOSIS — Z7984 Long term (current) use of oral hypoglycemic drugs: Secondary | ICD-10-CM

## 2021-12-20 DIAGNOSIS — F4310 Post-traumatic stress disorder, unspecified: Secondary | ICD-10-CM | POA: Diagnosis present

## 2021-12-20 DIAGNOSIS — I10 Essential (primary) hypertension: Secondary | ICD-10-CM | POA: Diagnosis present

## 2021-12-20 DIAGNOSIS — K859 Acute pancreatitis without necrosis or infection, unspecified: Secondary | ICD-10-CM | POA: Diagnosis not present

## 2021-12-20 DIAGNOSIS — F1011 Alcohol abuse, in remission: Secondary | ICD-10-CM | POA: Diagnosis present

## 2021-12-20 DIAGNOSIS — E1165 Type 2 diabetes mellitus with hyperglycemia: Secondary | ICD-10-CM | POA: Diagnosis present

## 2021-12-20 DIAGNOSIS — K861 Other chronic pancreatitis: Secondary | ICD-10-CM | POA: Diagnosis present

## 2021-12-20 DIAGNOSIS — E785 Hyperlipidemia, unspecified: Secondary | ICD-10-CM | POA: Diagnosis present

## 2021-12-20 DIAGNOSIS — Z9081 Acquired absence of spleen: Secondary | ICD-10-CM

## 2021-12-20 DIAGNOSIS — K760 Fatty (change of) liver, not elsewhere classified: Secondary | ICD-10-CM | POA: Diagnosis present

## 2021-12-20 DIAGNOSIS — Z79899 Other long term (current) drug therapy: Secondary | ICD-10-CM

## 2021-12-20 DIAGNOSIS — K3189 Other diseases of stomach and duodenum: Secondary | ICD-10-CM | POA: Diagnosis present

## 2021-12-20 DIAGNOSIS — K7460 Unspecified cirrhosis of liver: Secondary | ICD-10-CM | POA: Diagnosis present

## 2021-12-20 DIAGNOSIS — K2980 Duodenitis without bleeding: Secondary | ICD-10-CM | POA: Diagnosis present

## 2021-12-20 DIAGNOSIS — D539 Nutritional anemia, unspecified: Secondary | ICD-10-CM | POA: Diagnosis present

## 2021-12-20 DIAGNOSIS — N179 Acute kidney failure, unspecified: Secondary | ICD-10-CM | POA: Diagnosis present

## 2021-12-20 DIAGNOSIS — K766 Portal hypertension: Secondary | ICD-10-CM | POA: Diagnosis present

## 2021-12-20 DIAGNOSIS — F17210 Nicotine dependence, cigarettes, uncomplicated: Secondary | ICD-10-CM | POA: Diagnosis present

## 2021-12-20 DIAGNOSIS — Z86718 Personal history of other venous thrombosis and embolism: Secondary | ICD-10-CM

## 2021-12-20 LAB — COMPREHENSIVE METABOLIC PANEL
ALT: 19 U/L (ref 0–44)
AST: 26 U/L (ref 15–41)
Albumin: 4.9 g/dL (ref 3.5–5.0)
Alkaline Phosphatase: 51 U/L (ref 38–126)
Anion gap: 16 — ABNORMAL HIGH (ref 5–15)
BUN: 23 mg/dL — ABNORMAL HIGH (ref 6–20)
CO2: 32 mmol/L (ref 22–32)
Calcium: 10.3 mg/dL (ref 8.9–10.3)
Chloride: 91 mmol/L — ABNORMAL LOW (ref 98–111)
Creatinine, Ser: 1.35 mg/dL — ABNORMAL HIGH (ref 0.61–1.24)
GFR, Estimated: >60 mL/min (ref >60)
Glucose, Bld: 175 mg/dL — ABNORMAL HIGH (ref 70–99)
Potassium: 3.3 mmol/L — ABNORMAL LOW (ref 3.5–5.1)
Sodium: 139 mmol/L (ref 135–145)
Total Bilirubin: 0.8 mg/dL (ref 0.3–1.2)
Total Protein: 8.9 g/dL — ABNORMAL HIGH (ref 6.5–8.1)

## 2021-12-20 LAB — CBC WITH DIFFERENTIAL/PLATELET
Abs Immature Granulocytes: 0.04 10*3/uL (ref 0.00–0.07)
Basophils Absolute: 0.1 10*3/uL (ref 0.0–0.1)
Basophils Relative: 1 %
Eosinophils Absolute: 0.1 10*3/uL (ref 0.0–0.5)
Eosinophils Relative: 1 %
HCT: 39.2 % (ref 39.0–52.0)
Hemoglobin: 13.1 g/dL (ref 13.0–17.0)
Immature Granulocytes: 1 %
Lymphocytes Relative: 16 %
Lymphs Abs: 1.2 10*3/uL (ref 0.7–4.0)
MCH: 33.7 pg (ref 26.0–34.0)
MCHC: 33.4 g/dL (ref 30.0–36.0)
MCV: 100.8 fL — ABNORMAL HIGH (ref 80.0–100.0)
Monocytes Absolute: 0.9 10*3/uL (ref 0.1–1.0)
Monocytes Relative: 13 %
Neutro Abs: 5.2 10*3/uL (ref 1.7–7.7)
Neutrophils Relative %: 68 %
Platelets: 360 10*3/uL (ref 150–400)
RBC: 3.89 MIL/uL — ABNORMAL LOW (ref 4.22–5.81)
RDW: 12.8 % (ref 11.5–15.5)
WBC: 7.5 10*3/uL (ref 4.0–10.5)
nRBC: 0 % (ref 0.0–0.2)

## 2021-12-20 LAB — LIPASE, BLOOD: Lipase: 172 U/L — ABNORMAL HIGH (ref 11–51)

## 2021-12-20 MED ORDER — ORAL CARE MOUTH RINSE: 15.0000 mL | OROMUCOSAL | Status: DC | PRN

## 2021-12-20 MED ORDER — HYDROMORPHONE HCL 1 MG/ML IJ SOLN
1.0000 mg | INTRAMUSCULAR | Status: DC | PRN
  Administered 2021-12-20 – 2021-12-23 (×21): 1 mg via INTRAVENOUS
  Filled 2021-12-20 (×21): qty 1

## 2021-12-20 MED ORDER — HYDROMORPHONE HCL 1 MG/ML IJ SOLN
2.0000 mg | Freq: Once | INTRAMUSCULAR | Status: AC
  Administered 2021-12-20: 2 mg via INTRAVENOUS
  Filled 2021-12-20: qty 2

## 2021-12-20 MED ORDER — PANTOPRAZOLE SODIUM 40 MG IV SOLR
40.0000 mg | Freq: Once | INTRAVENOUS | Status: AC
  Administered 2021-12-20: 40 mg via INTRAVENOUS
  Filled 2021-12-20: qty 10

## 2021-12-20 MED ORDER — PANTOPRAZOLE SODIUM 40 MG IV SOLR
40.0000 mg | Freq: Every day | INTRAVENOUS | Status: DC
  Administered 2021-12-21 – 2021-12-22 (×3): 40 mg via INTRAVENOUS
  Filled 2021-12-20 (×3): qty 10

## 2021-12-20 MED ORDER — POLYETHYLENE GLYCOL 3350 17 G PO PACK: 17.0000 g | PACK | Freq: Every day | ORAL | Status: DC | PRN

## 2021-12-20 MED ORDER — ONDANSETRON HCL 4 MG/2ML IJ SOLN
4.0000 mg | Freq: Once | INTRAMUSCULAR | Status: AC
  Administered 2021-12-20: 4 mg via INTRAVENOUS
  Filled 2021-12-20: qty 2

## 2021-12-20 MED ORDER — HYDRALAZINE HCL 20 MG/ML IJ SOLN
2.0000 mg | Freq: Four times a day (QID) | INTRAMUSCULAR | Status: DC | PRN
Start: 2021-12-20 — End: 2021-12-23

## 2021-12-20 MED ORDER — ACETAMINOPHEN 325 MG PO TABS: 650.0000 mg | ORAL_TABLET | Freq: Four times a day (QID) | ORAL | Status: DC | PRN

## 2021-12-20 MED ORDER — OXYCODONE HCL 5 MG PO TABS
5.0000 mg | ORAL_TABLET | Freq: Four times a day (QID) | ORAL | Status: DC | PRN
  Administered 2021-12-21 – 2021-12-23 (×2): 5 mg via ORAL
  Filled 2021-12-20 (×2): qty 1

## 2021-12-20 MED ORDER — PROCHLORPERAZINE EDISYLATE 10 MG/2ML IJ SOLN: 10.0000 mg | Freq: Four times a day (QID) | INTRAMUSCULAR | Status: DC | PRN

## 2021-12-20 MED ORDER — INSULIN ASPART 100 UNIT/ML IJ SOLN
0.0000 [IU] | INTRAMUSCULAR | Status: DC
  Administered 2021-12-21: 1 [IU] via SUBCUTANEOUS
  Administered 2021-12-22 – 2021-12-23 (×2): 3 [IU] via SUBCUTANEOUS
  Administered 2021-12-23: 2 [IU] via SUBCUTANEOUS

## 2021-12-20 MED ORDER — ENOXAPARIN SODIUM 40 MG/0.4ML IJ SOSY
40.0000 mg | PREFILLED_SYRINGE | Freq: Every day | INTRAMUSCULAR | Status: DC
  Administered 2021-12-20 – 2021-12-22 (×3): 40 mg via SUBCUTANEOUS
  Filled 2021-12-20 (×3): qty 0.4

## 2021-12-20 MED ORDER — SODIUM CHLORIDE 0.9 % IV BOLUS
2000.0000 mL | Freq: Once | INTRAVENOUS | Status: AC
  Administered 2021-12-20: 1000 mL via INTRAVENOUS

## 2021-12-20 MED ORDER — POTASSIUM CHLORIDE 2 MEQ/ML IV SOLN
INTRAVENOUS | Status: DC
  Filled 2021-12-20 (×9): qty 1000

## 2021-12-20 NOTE — ED Triage Notes (Signed)
Reports recurrent upper abdominal pain and nausea since this morning. Seen recently for same, states his sx resolved x 1 day and returned this morning. Multiple vomiting episodes in lobby. Unable to take home PO meds due to vomiting.

## 2021-12-20 NOTE — H&P (Signed)
History and Physical  Samuel Huffman:[id number] DOB: May 20, 1966 DOA: 12/20/2021  Referring physician: Accepted by Dr. Bonner Puna, [facility]. PCP: Darreld Mclean, MD  Outpatient Specialists: GI Patient coming from: Home via [facility] ED.  Chief Complaint: Upper abdominal pain.  HPI: Samuel Huffman is a 56 y.o. male with medical history significant for former alcohol use disorder, quit alcohol use years ago, former tobacco use disorder, quit tobacco use more than a year ago, type 2 diabetes, hypertension, hyperlipidemia, who initially presented to [facility] ED with complaints of upper abdominal pain radiating to his back.  Onset 4 days ago.  The pain is sharp at times and dull at other times.  His symptoms are similar to prior acute pancreatitis flare.  Associated with poor oral intake, nausea and vomiting and inability to keep any liquids or solid food down for the past 4 days.  He presented to [facility] ED for further evaluation.  Work-up in the ED reveals elevated lipase level more than 3 times upper limit of normal, possible acute on chronic pancreatitis on contrast CT abdomen and pelvis.  The patient received 2 L of IV fluid boluses normal saline in the ED, 2 mg IV Dilaudid x1, 8 mg of IV Zofran.  The patient was accepted by the hospitalist service, Dr. Bonner Puna, [facility], and transferred to [facility] MedSurg unit as observation status.  The patient was seen and examined at his bedside at [place] his abdominal pain is 8 out of 10 and radiating to his back.  The pain is dull at times and sharp at other times.  His nausea is improved at this time.  No cardiopulmonary symptoms.  ED Course: Tmax 98.8.  BP 128/85.  Pulse 69, respiratory 18, saturation 97% on room air.  Lab studies markable for serum potassium 3.3, glucose 175, BUN 23, creatinine 1.35 with baseline creatinine of 0.90.  Lipase 172.  CBC was essentially unremarkable.  Contra CT scan showing duodenitis versus groove pancreatitis.  Inflammation is  more centered on the duodenum, favoring duodenitis.  Chronic pancreatitis.  Hepatic steatosis.  Review of Systems: Review of systems as noted in the HPI. All other systems reviewed and are negative.   Past Medical History:  Diagnosis Date   Acute upper GI bleeding 08/02/2016   Anxiety    Blood transfusion without reported diagnosis    Cirrhosis of liver with ascites ([name])    Diabetes mellitus without complication (HCC)    Hiatal hernia    History of alcohol abuse    Hypertension    Insomnia    Pancreatic pseudocyst    Pancreatitis    Portal hypertensive gastropathy (HCC)    PTSD (post-traumatic stress disorder)    Pulmonary nodule    Varices, gastric    Past Surgical History:  Procedure Laterality Date   ESOPHAGOGASTRODUODENOSCOPY N/A 12/18/2016   Procedure: ESOPHAGOGASTRODUODENOSCOPY (EGD);  Surgeon: Irene Shipper, MD;  Location: [facility];  Service: Endoscopy;  Laterality: N/A;   ESOPHAGOGASTRODUODENOSCOPY (EGD) WITH PROPOFOL N/A 08/03/2016   Procedure: ESOPHAGOGASTRODUODENOSCOPY (EGD) WITH PROPOFOL;  Surgeon: Milus Banister, MD;  Location: [place];  Service: Endoscopy;  Laterality: N/A;   EUS  08/03/2016   Procedure: UPPER ENDOSCOPIC ULTRASOUND (EUS) LINEAR;  Surgeon: Milus Banister, MD;  Location: [place];  Service: Endoscopy;;   FEMUR SURGERY Left    LAPAROSCOPY N/A 12/21/2016   Procedure: LAPAROSCOPY LYSIS OF ADHESIONS, EXPLORATORY LAPAROTOMY, SPLENECTOMY;  Surgeon: Jackolyn Confer, MD;  Location: [facility];  Service: General;  Laterality:  N/A;   SPLENECTOMY      Social History:  reports that he has been smoking cigarettes. He has been smoking an average of .5 packs per day. His smokeless tobacco use includes snuff. He reports that he does not drink alcohol and does not use drugs.   No Known Allergies  Family History  Adopted: Yes  Problem Relation Age of Onset   Healthy Son        x1    The patient lives in an orphanage and was adopted.  Prior to  Admission medications   Medication Sig Start Date End Date Taking? Authorizing Provider  Accu-Chek Softclix Lancets lancets SMARTSIG:Topical 05/30/20   [provider]  blood glucose meter kit and supplies Dispense based on patient and insurance preference. Use up to four times daily as directed. (FOR ICD-10 E10.9, E11.9). 05/30/20   Lavina Hamman, MD  glimepiride (AMARYL) 4 MG tablet TAKE 1 TABLET(4 MG) BY MOUTH DAILY WITH BREAKFAST 07/24/21   Copland, Gay Filler, MD  glucose blood (ACCU-CHEK GUIDE) test strip Use as instructed 10/26/20   Copland, Gay Filler, MD  HYDROcodone-acetaminophen (NORCO/VICODIN) 5-325 MG tablet Take 1 tablet by mouth every 6 (six) hours as needed. 12/18/21   Drenda Freeze, MD  losartan (COZAAR) 50 MG tablet TAKE 1 TABLET(50 MG) BY MOUTH DAILY 07/24/21   Copland, Gay Filler, MD  metFORMIN (GLUCOPHAGE-XR) 750 MG 24 hr tablet TAKE 1 TABLET(750 MG) BY MOUTH TWICE DAILY WITH A MEAL 07/24/21   Copland, Gay Filler, MD  ondansetron (ZOFRAN-ODT) 8 MG disintegrating tablet Take 1 tablet (8 mg total) by mouth every 8 (eight) hours as needed for nausea or vomiting. 12/18/21   Drenda Freeze, MD  pantoprazole (PROTONIX) 40 MG tablet Take 1 tablet (40 mg total) by mouth daily. 12/17/21   Dorie Rank, MD    Physical Exam: BP 128/85 (BP Location: Left Arm)   Pulse 69   Temp 97.7 F (36.5 C)   Resp 18   Ht $R'6\' 2"'qE$  (1.88 m)   Wt 81.5 kg   SpO2 97%   BMI 23.07 kg/m   General: 56 y.o. year-old male well developed well nourished in no acute distress.  Alert and oriented x3. Cardiovascular: Regular rate and rhythm with no rubs or gallops.  No thyromegaly or JVD noted.  No lower extremity edema. 2/4 pulses in all 4 extremities. Respiratory: Clear to auscultation with no wheezes or rales. Good inspiratory effort. Abdomen: Abdomen is soft.  Nondistended with tenderness with palpation in upper abdomen.  With normal bowel sounds x4 quadrants. Muskuloskeletal: No cyanosis, clubbing or  edema noted bilaterally Neuro: CN II-XII intact, strength, sensation, reflexes Skin: No ulcerative lesions noted or rashes Psychiatry: Judgement and insight appear normal. Mood is appropriate for condition and setting          Labs on Admission:  Basic Metabolic Panel: Recent Labs  Lab 12/17/21 0642 12/18/21 1425 12/20/21 1618  NA 137 134* 139  K 4.8 3.6 3.3*  CL 98 95* 91*  CO2 22 25 32  GLUCOSE 128* 214* 175*  BUN 27* 23* 23*  CREATININE 0.98 0.90 1.35*  CALCIUM 10.1 9.8 10.3   Liver Function Tests: Recent Labs  Lab 12/17/21 0642 12/18/21 1425 12/20/21 1618  AST $Re'30 26 26  'pYA$ ALT $R'23 18 19  'qY$ ALKPHOS 51 47 51  BILITOT 0.8 0.6 0.8  PROT 8.7* 8.3* 8.9*  ALBUMIN 4.9 4.5 4.9   Recent Labs  Lab 12/17/21 0642 12/18/21 1425 12/20/21 1618  LIPASE 88* 131* 172*   No results for input(s): "AMMONIA" in the last 168 hours. CBC: Recent Labs  Lab 12/17/21 0642 12/18/21 1425 12/20/21 1618  WBC 14.3* 9.5 7.5  NEUTROABS 11.7* 7.5 5.2  HGB 13.5 12.5* 13.1  HCT 40.3 36.1* 39.2  MCV 101.0* 99.2 100.8*  PLT 379 332 360   Cardiac Enzymes: No results for input(s): "CKTOTAL", "CKMB", "CKMBINDEX", "TROPONINI" in the last 168 hours.  BNP (last 3 results) No results for input(s): "BNP" in the last 8760 hours.  ProBNP (last 3 results) No results for input(s): "PROBNP" in the last 8760 hours.  CBG: Recent Labs  Lab 12/18/21 1545  GLUCAP 199*    Radiological Exams on Admission: No results found.  EKG: I independently viewed the EKG done and my findings are as followed: Sinus tachycardia rate of 128.  Nonspecific ST-T changes.  QTc 462.  Assessment/Plan Present on Admission:  Acute recurrent pancreatitis  Principal Problem:   Acute recurrent pancreatitis  Acute recurrent pancreatitis in the setting of chronic pancreatitis. As evidenced by upper abdominal pain radiating to his back, elevated lipase 173. CT scan possible acute on chronic pancreatitis, however favoring  acute duodenitis. Unclear etiology of the acute pancreatitis Stopped alcohol use for years Endorses last tobacco use was more than a year ago N.p.o. until the patient is ready to eat, then start clear liquid diet. IV fluid hydration LR KCl 40 mEq at 125 cc/h x 2 days IV pain control with IV Dilaudid IV antiemetics as needed  Acute duodenitis seen on CT scan IV PPI daily Consult GI in the morning for further evaluation or provide GI referral at discharge  Hypokalemia Serum potassium 3.3 Repleted intravenously Obtain magnesium level Repeat chemistry panel in the morning  AKI, prerenal in the setting of dehydration from acute on chronic pancreatitis Baseline creatinine 0.90 with GFR greater than 60. Presented with creatinine 1.35 IV fluid hydration Monitor urine output Avoid nephrotoxic agents, dehydration and hypotension.  Hyperlipidemia Last triglycerides 203 on 07/24/2021. Last LDL 141 on 04/10/17 Obtain fasting lipid panel in the morning.  Type 2 diabetes with hyperglycemia Last hemoglobin A1c 7.4 on 07/24/2021 Hold off home oral hypoglycemics Start insulin sliding scale every 4 hours while NPO    DVT prophylaxis: Subcu Lovenox daily  Code Status: Full code.  Family Communication: None at bedside.  Disposition Plan: Admitted to MedSurg unit  Consults called: None.  Admission status: Observation status   Status is: Observation    Kayleen Memos MD [facility] Pager [phone]  If 7PM-7AM, please contact night-coverage [email or web address] Password [facility]  12/20/2021, 10:53 PM

## 2021-12-20 NOTE — Plan of Care (Signed)

## 2021-12-20 NOTE — ED Notes (Signed)
Having worse abd pain since last presentation to ED. Abd tender to palpation, rigid. Negative Cullen sign noted at this time. Active Nausea and Vomiting since presentation to ED today.  Placed on cont cardiac monitoring with ECG performed and reviewed by attending MD. IV established and ED orders immediately implemented.

## 2021-12-20 NOTE — ED Provider Notes (Signed)
[facility] [place] EMERGENCY DEPARTMENT Provider Note   CSN: [id number] Arrival date & time: 12/20/21  1459     History  Chief Complaint  Patient presents with   Abdominal Pain    Samuel Huffman is a 56 y.o. male hx of pancreatitis here with abdominal pain and vomiting.  This is his third ED visit this week.  Patient was diagnosed with pancreatitis 3 days ago.  CT showed uncomplicated pancreatitis as well as possible duodenitis.  He was discharged on pain medicine and nausea medicine and is already on PPIs.  He came back to the ED 2 days ago with persistent pain and vomiting but did not fill his prescription.  He states that he felt fine yesterday but yesterday evening started vomiting.  He states that this morning he could not keep anything down despite the Zofran.  His pain also returned and is very severe.  He tried taking the Percocet with no relief.  He states that he cannot keep any water down either.  Adamantly denies any alcohol use  The history is provided by the patient.       Home Medications Prior to Admission medications   Medication Sig Start Date End Date Taking? Authorizing Provider  Accu-Chek Softclix Lancets lancets SMARTSIG:Topical 05/30/20   [provider]  blood glucose meter kit and supplies Dispense based on patient and insurance preference. Use up to four times daily as directed. (FOR ICD-10 E10.9, E11.9). 05/30/20   Lavina Hamman, MD  glimepiride (AMARYL) 4 MG tablet TAKE 1 TABLET(4 MG) BY MOUTH DAILY WITH BREAKFAST 07/24/21   Copland, Gay Filler, MD  glucose blood (ACCU-CHEK GUIDE) test strip Use as instructed 10/26/20   Copland, Gay Filler, MD  HYDROcodone-acetaminophen (NORCO/VICODIN) 5-325 MG tablet Take 1 tablet by mouth every 6 (six) hours as needed. 12/18/21   Drenda Freeze, MD  losartan (COZAAR) 50 MG tablet TAKE 1 TABLET(50 MG) BY MOUTH DAILY 07/24/21   Copland, Gay Filler, MD  metFORMIN (GLUCOPHAGE-XR) 750 MG 24 hr tablet TAKE 1 TABLET(750  MG) BY MOUTH TWICE DAILY WITH A MEAL 07/24/21   Copland, Gay Filler, MD  ondansetron (ZOFRAN-ODT) 8 MG disintegrating tablet Take 1 tablet (8 mg total) by mouth every 8 (eight) hours as needed for nausea or vomiting. 12/18/21   Drenda Freeze, MD  pantoprazole (PROTONIX) 40 MG tablet Take 1 tablet (40 mg total) by mouth daily. 12/17/21   Dorie Rank, MD      Allergies    Patient has no known allergies.    Review of Systems   Review of Systems  Gastrointestinal:  Positive for abdominal pain and vomiting.  All other systems reviewed and are negative.   Physical Exam Updated Vital Signs BP 127/90 (BP Location: Left Arm)   Pulse (!) 112   Temp 98.8 F (37.1 C) (Oral)   Resp 20   SpO2 100%  Physical Exam Vitals and nursing note reviewed.  Constitutional:      Comments: Uncomfortable, vomiting  HENT:     Head: Normocephalic.     Mouth/Throat:     Pharynx: Oropharynx is clear.  Eyes:     Extraocular Movements: Extraocular movements intact.     Pupils: Pupils are equal, round, and reactive to light.  Cardiovascular:     Rate and Rhythm: Normal rate and regular rhythm.     Heart sounds: Normal heart sounds.  Pulmonary:     Effort: Pulmonary effort is normal.     Breath sounds: Normal  breath sounds.  Abdominal:     General: Abdomen is flat.     Comments: + epigastric tenderness   Skin:    General: Skin is warm.     Capillary Refill: Capillary refill takes less than 2 seconds.  Neurological:     General: No focal deficit present.     Mental Status: He is oriented to person, place, and time.  Psychiatric:        Mood and Affect: Mood normal.        Behavior: Behavior normal.     ED Results / Procedures / Treatments   Labs (all labs ordered are listed, but only abnormal results are displayed) Labs Reviewed  CBC WITH DIFFERENTIAL/PLATELET - Abnormal; Notable for the following components:      Result Value   RBC 3.89 (*)    MCV 100.8 (*)    All other components within  normal limits  COMPREHENSIVE METABOLIC PANEL - Abnormal; Notable for the following components:   Potassium 3.3 (*)    Chloride 91 (*)    Glucose, Bld 175 (*)    BUN 23 (*)    Creatinine, Ser 1.35 (*)    Total Protein 8.9 (*)    Anion gap 16 (*)    All other components within normal limits  LIPASE, BLOOD - Abnormal; Notable for the following components:   Lipase 172 (*)    All other components within normal limits    EKG EKG Interpretation  Date/Time:  Wednesday December 20 2021 16:00:12 EDT Ventricular Rate:  120 PR Interval:  140 QRS Duration: 81 QT Interval:  327 QTC Calculation: 462 R Axis:   78 Text Interpretation: Sinus tachycardia RSR' in V1 or V2, right VCD or RVH No previous ECGs available Confirmed by Wandra Arthurs [id number]) on 12/20/2021 4:02:55 PM  Radiology No results found.  Procedures Procedures    Medications Ordered in ED Medications  sodium chloride 0.9 % bolus 2,000 mL (1,000 mLs Intravenous New Bag/Given 12/20/21 1608)  HYDROmorphone (DILAUDID) injection 2 mg (2 mg Intravenous Given 12/20/21 1610)  ondansetron (ZOFRAN) injection 4 mg (4 mg Intravenous Given 12/20/21 1609)  pantoprazole (PROTONIX) injection 40 mg (40 mg Intravenous Given 12/20/21 1612)    ED Course/ Medical Decision Making/ A&P                           Medical Decision Making Samuel Huffman is a 56 y.o. male here presenting with abdominal pain and vomiting.  Patient has recurrent pancreatitis.  Reviewed labs from 3 days ago and also 2 days ago and his imaging studies from 3 days ago.  At this point he has failed outpatient therapy.  We will repeat CBC and CMP and lipase and also hydrate patient and give Zofran and pain medicine.   4:58 PM I reviewed patient's labs today.  Patient now has an AKI with creatinine of 1.3 and the anion gap of 16.  His lipase has increased to 172.  At this point, patient will be admitted for pain control for pancreatitis and he failed outpatient therapy.   Problems  Addressed: Acute pancreatitis, unspecified complication status, unspecified pancreatitis type: acute illness or injury  Amount and/or Complexity of Data Reviewed Labs: ordered. Decision-making details documented in ED Course. ECG/medicine tests: ordered and independent interpretation performed. Decision-making details documented in ED Course.  Risk Prescription drug management. Decision regarding hospitalization.    Final Clinical Impression(s) / ED Diagnoses Final diagnoses:  None  Rx / DC Orders ED Discharge Orders     None         Drenda Freeze, MD 12/20/21 1659

## 2021-12-21 DIAGNOSIS — F4310 Post-traumatic stress disorder, unspecified: Secondary | ICD-10-CM | POA: Diagnosis not present

## 2021-12-21 DIAGNOSIS — K7460 Unspecified cirrhosis of liver: Secondary | ICD-10-CM | POA: Diagnosis not present

## 2021-12-21 DIAGNOSIS — K859 Acute pancreatitis without necrosis or infection, unspecified: Secondary | ICD-10-CM | POA: Diagnosis not present

## 2021-12-21 DIAGNOSIS — Z86718 Personal history of other venous thrombosis and embolism: Secondary | ICD-10-CM | POA: Diagnosis not present

## 2021-12-21 DIAGNOSIS — N179 Acute kidney failure, unspecified: Secondary | ICD-10-CM | POA: Diagnosis not present

## 2021-12-21 DIAGNOSIS — Z79899 Other long term (current) drug therapy: Secondary | ICD-10-CM | POA: Diagnosis not present

## 2021-12-21 DIAGNOSIS — F17210 Nicotine dependence, cigarettes, uncomplicated: Secondary | ICD-10-CM | POA: Diagnosis present

## 2021-12-21 DIAGNOSIS — E785 Hyperlipidemia, unspecified: Secondary | ICD-10-CM | POA: Diagnosis not present

## 2021-12-21 DIAGNOSIS — E1165 Type 2 diabetes mellitus with hyperglycemia: Secondary | ICD-10-CM | POA: Diagnosis not present

## 2021-12-21 DIAGNOSIS — E876 Hypokalemia: Secondary | ICD-10-CM | POA: Diagnosis not present

## 2021-12-21 DIAGNOSIS — D539 Nutritional anemia, unspecified: Secondary | ICD-10-CM | POA: Diagnosis not present

## 2021-12-21 DIAGNOSIS — E860 Dehydration: Secondary | ICD-10-CM | POA: Diagnosis not present

## 2021-12-21 DIAGNOSIS — K760 Fatty (change of) liver, not elsewhere classified: Secondary | ICD-10-CM | POA: Diagnosis not present

## 2021-12-21 DIAGNOSIS — K766 Portal hypertension: Secondary | ICD-10-CM | POA: Diagnosis not present

## 2021-12-21 DIAGNOSIS — Z7984 Long term (current) use of oral hypoglycemic drugs: Secondary | ICD-10-CM | POA: Diagnosis not present

## 2021-12-21 DIAGNOSIS — K861 Other chronic pancreatitis: Secondary | ICD-10-CM | POA: Diagnosis not present

## 2021-12-21 DIAGNOSIS — I10 Essential (primary) hypertension: Secondary | ICD-10-CM | POA: Diagnosis not present

## 2021-12-21 DIAGNOSIS — Z9081 Acquired absence of spleen: Secondary | ICD-10-CM | POA: Diagnosis not present

## 2021-12-21 DIAGNOSIS — K2980 Duodenitis without bleeding: Secondary | ICD-10-CM | POA: Diagnosis not present

## 2021-12-21 DIAGNOSIS — K3189 Other diseases of stomach and duodenum: Secondary | ICD-10-CM | POA: Diagnosis present

## 2021-12-21 DIAGNOSIS — F1011 Alcohol abuse, in remission: Secondary | ICD-10-CM | POA: Diagnosis not present

## 2021-12-21 LAB — CBC WITH DIFFERENTIAL/PLATELET
Abs Immature Granulocytes: 0.02 10*3/uL (ref 0.00–0.07)
Basophils Absolute: 0 10*3/uL (ref 0.0–0.1)
Basophils Relative: 1 %
Eosinophils Absolute: 0.3 10*3/uL (ref 0.0–0.5)
Eosinophils Relative: 5 %
HCT: 31.1 % — ABNORMAL LOW (ref 39.0–52.0)
Hemoglobin: 10.1 g/dL — ABNORMAL LOW (ref 13.0–17.0)
Immature Granulocytes: 0 %
Lymphocytes Relative: 29 %
Lymphs Abs: 1.6 10*3/uL (ref 0.7–4.0)
MCH: 33.6 pg (ref 26.0–34.0)
MCHC: 32.5 g/dL (ref 30.0–36.0)
MCV: 103.3 fL — ABNORMAL HIGH (ref 80.0–100.0)
Monocytes Absolute: 0.8 10*3/uL (ref 0.1–1.0)
Monocytes Relative: 15 %
Neutro Abs: 2.8 10*3/uL (ref 1.7–7.7)
Neutrophils Relative %: 50 %
Platelets: 298 10*3/uL (ref 150–400)
RBC: 3.01 MIL/uL — ABNORMAL LOW (ref 4.22–5.81)
RDW: 12.8 % (ref 11.5–15.5)
WBC: 5.5 10*3/uL (ref 4.0–10.5)
nRBC: 0 % (ref 0.0–0.2)

## 2021-12-21 LAB — LIPID PANEL
Cholesterol: 148 mg/dL (ref 0–200)
HDL: 41 mg/dL (ref >40)
LDL Cholesterol: 87 mg/dL (ref 0–99)
Total CHOL/HDL Ratio: 3.6 RATIO
Triglycerides: 102 mg/dL (ref <150)
VLDL: 20 mg/dL (ref 0–40)

## 2021-12-21 LAB — COMPREHENSIVE METABOLIC PANEL
ALT: 15 U/L (ref 0–44)
AST: 18 U/L (ref 15–41)
Albumin: 3.5 g/dL (ref 3.5–5.0)
Alkaline Phosphatase: 36 U/L — ABNORMAL LOW (ref 38–126)
Anion gap: 7 (ref 5–15)
BUN: 25 mg/dL — ABNORMAL HIGH (ref 6–20)
CO2: 32 mmol/L (ref 22–32)
Calcium: 8.8 mg/dL — ABNORMAL LOW (ref 8.9–10.3)
Chloride: 100 mmol/L (ref 98–111)
Creatinine, Ser: 0.91 mg/dL (ref 0.61–1.24)
GFR, Estimated: >60 mL/min (ref >60)
Glucose, Bld: 162 mg/dL — ABNORMAL HIGH (ref 70–99)
Potassium: 3.3 mmol/L — ABNORMAL LOW (ref 3.5–5.1)
Sodium: 139 mmol/L (ref 135–145)
Total Bilirubin: 0.6 mg/dL (ref 0.3–1.2)
Total Protein: 6.5 g/dL (ref 6.5–8.1)

## 2021-12-21 LAB — HIV ANTIBODY (ROUTINE TESTING W REFLEX): HIV Screen 4th Generation wRfx: NONREACTIVE

## 2021-12-21 LAB — GLUCOSE, CAPILLARY
Glucose-Capillary: 113 mg/dL — ABNORMAL HIGH (ref 70–99)
Glucose-Capillary: 113 mg/dL — ABNORMAL HIGH (ref 70–99)
Glucose-Capillary: 118 mg/dL — ABNORMAL HIGH (ref 70–99)
Glucose-Capillary: 130 mg/dL — ABNORMAL HIGH (ref 70–99)
Glucose-Capillary: 136 mg/dL — ABNORMAL HIGH (ref 70–99)
Glucose-Capillary: 65 mg/dL — ABNORMAL LOW (ref 70–99)
Glucose-Capillary: 88 mg/dL (ref 70–99)
Glucose-Capillary: 94 mg/dL (ref 70–99)

## 2021-12-21 LAB — MAGNESIUM: Magnesium: 2 mg/dL (ref 1.7–2.4)

## 2021-12-21 LAB — PHOSPHORUS: Phosphorus: 3 mg/dL (ref 2.5–4.6)

## 2021-12-21 MED ORDER — DEXTROSE 50 % IV SOLN
12.5000 g | Freq: Once | INTRAVENOUS | Status: AC
  Administered 2021-12-21: 12.5 g via INTRAVENOUS

## 2021-12-21 MED ORDER — DEXTROSE 50 % IV SOLN
INTRAVENOUS | Status: AC
  Filled 2021-12-21: qty 50

## 2021-12-21 MED ORDER — DIPHENHYDRAMINE HCL 50 MG/ML IJ SOLN
25.0000 mg | Freq: Four times a day (QID) | INTRAMUSCULAR | Status: DC | PRN
Start: 2021-12-21 — End: 2021-12-23
  Administered 2021-12-21 – 2021-12-23 (×8): 25 mg via INTRAVENOUS
  Filled 2021-12-21 (×8): qty 1

## 2021-12-21 NOTE — TOC Initial Note (Signed)
Transition of Care [facility]) - Initial/Assessment Note    Patient Details  Name: Samuel Huffman MRN: [id number] Date of Birth: [birth year]  Transition of Care [facility]) CM/SW Contact:    Golda Acre, RN Phone Number: 12/21/2021, 9:12 AM  Clinical Narrative:                  Transition of Care [facility]) Screening Note   Patient Details  Name: Samuel Huffman Date of Birth: 1965-10-12   Transition of Care [facility]) CM/SW Contact:    Golda Acre, RN Phone Number: 12/21/2021, 9:12 AM    Transition of Care Department [facility]) has reviewed patient and no TOC needs have been identified at this time. We will continue to monitor patient advancement through interdisciplinary progression rounds. If new patient transition needs arise, please place a TOC consult.    Expected Discharge Plan: Home/Self Care Barriers to Discharge: Continued Medical Work up   Patient Goals and CMS Choice Patient states their goals for this hospitalization and ongoing recovery are:: to go home CMS Medicare.gov Compare Post Acute Care list provided to:: Patient    Expected Discharge Plan and Services Expected Discharge Plan: Home/Self Care   Discharge Planning Services: CM Consult   Living arrangements for the past 2 months: Single Family Home                                      Prior Living Arrangements/Services Living arrangements for the past 2 months: Single Family Home Lives with:: Self Patient language and need for interpreter reviewed:: Yes Do you feel safe going back to the place where you live?: Yes            Criminal Activity/Legal Involvement Pertinent to Current Situation/Hospitalization: No - Comment as needed  Activities of Daily Living Home Assistive Devices/Equipment: Eyeglasses ADL Screening (condition at time of admission) Patient's cognitive ability adequate to safely complete daily activities?: No Is the patient deaf or have difficulty hearing?: No Does the patient  have difficulty seeing, even when wearing glasses/contacts?: No Does the patient have difficulty concentrating, remembering, or making decisions?: No Patient able to express need for assistance with ADLs?: Yes Does the patient have difficulty dressing or bathing?: No Independently performs ADLs?: Yes (appropriate for developmental age) Communication: Independent Dressing (OT): Independent Grooming: Independent Feeding: Independent Bathing: Independent Toileting: Independent In/Out Bed: Independent Walks in Home: Independent Does the patient have difficulty walking or climbing stairs?: No Weakness of Legs: None Weakness of Arms/Hands: None  Permission Sought/Granted                  Emotional Assessment Appearance:: Appears stated age     Orientation: : Oriented to Self, Oriented to Place, Oriented to  Time, Oriented to Situation Alcohol / Substance Use: Alcohol Use, Tobacco Use (history of use no present useage) Psych Involvement: No (comment)  Admission diagnosis:  Acute recurrent pancreatitis [K85.90] Acute pancreatitis, unspecified complication status, unspecified pancreatitis type [K85.90] Patient Active Problem List   Diagnosis Date Noted   Acute recurrent pancreatitis 12/20/2021   Diabetes mellitus (HCC) 05/28/2020   DKA (diabetic ketoacidosis) (HCC) 05/28/2020   Microcytic anemia 04/10/2017   SIRS (systemic inflammatory response syndrome) (HCC) 04/10/2017   Liver cirrhosis (HCC) 04/10/2017   GERD (gastroesophageal reflux disease) 04/10/2017   Cirrhosis of liver with ascites (HCC)    S/P laparoscopy 12/21/2016   Bleeding gastric varices    Hyponatremia  08/04/2016   Hypokalemia 08/04/2016   Abnormal CT scan, stomach    Upper GI bleed 08/02/2016   Hematemesis 08/02/2016   Pancreatic pseudocyst    Pulmonary nodule    Acute on chronic pancreatitis (HCC) 10/01/2014   AKI (acute kidney injury) (HCC) 10/01/2014   History of alcohol abuse 10/01/2014   Lactic  acidosis 10/01/2014   Insomnia 07/05/2014   PCP:  Pearline Cables, MD Pharmacy:   [facility] [phone] - [place], [place] - [address] AT [facility] [address] [place] [address] Phone: [phone] Fax: [phone]  [facility] [address] [place] [address] Phone: [phone] Fax: [phone]  [id number] - [place], [place] - [address] AT [facility] [address] [place]  [address] Phone: [phone] Fax: [phone]     Social Determinants of Health (SDOH) Interventions    Readmission Risk Interventions     No data to display

## 2021-12-21 NOTE — Progress Notes (Signed)
Chaplain engaged in an initial visit with Samuel Huffman.  Chaplain offered support around consult for Advanced Directive, Healthcare POA.  Samuel Huffman voiced that he was feeling "foggy" today and would like to go over it in the morning.  Chaplain affirmed that idea and will have someone follow-up tomorrow.    Chaplain was able to establish relationship with Samuel Huffman, provide some limited education on what an AD is, and provide support.  He is looking forward to feeling more like himself, being able to eat, and get moving.      12/21/21 1000  Clinical Encounter Type  Visited With Patient  Visit Type Initial;Social support

## 2021-12-21 NOTE — Progress Notes (Addendum)
Progress Note  Patient: Samuel Huffman JTT:[id number] DOB: 07-13-1965  DOA: 12/20/2021  DOS: 12/21/2021    Brief hospital course: Samuel Huffman is a 56 y.o. 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  Assessment and Plan: Acute recurrent pancreatitis in the setting of chronic pancreatitis: Lipase 173, CT with inflammation of pancreas and adjacent duodenum.  - Start clears with plans to convert to oral medications once tolerating diet, advance as tolerated. Continue IV medications and fluids until that time.    Duodenitis: Regional inflammation from pancreas affecting duodenum. Note he had EGD with Dr. Marina Goodell in 2018 which showed extrinsic deformity at that area due to inflammation.  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   Macrocytic anemia:  - Anemia panel in AM with repeat CBC to confirm stability.   Hypokalemia: Supplement in IVF. Mg 2.0.  - Monitor   AKI, prerenal: Resolved. - Avoid nephrotoxic agents, dehydration and hypotension.   Hyperlipidemia: LDL 87, HDL 41, triglycerides 102.   Alcohol abuse: Has been in sustained remission.   NIDT2DM: Uncontrolled with hyperglycemia, HbA1c 7.4%. - Continue SSI, change to AC/HS if he tolerates liquids  Subjective: Abdominal pain is improved with IV medications, some mild nausea but able to drink water.   Objective: Vitals:   12/21/21 0015 12/21/21 0401 12/21/21 0802 12/21/21 1242  BP: 121/77 110/73 115/81 133/84  Pulse: 77 67 66 (!) 56  Resp: 15 20 16 16   Temp: 97.8 F (36.6 C) 97.9 F (36.6 C) 97.7 F (36.5 C) 97.7 F (36.5 C)  TempSrc: Oral Oral Oral Oral  SpO2: 97% 95% 100% 97%  Weight:      Height:       Gen: 56 y.o. male in no distress Pulm: Nonlabored breathing room air. Clear CV:  Regular rate and rhythm. No murmur, rub, or gallop. No JVD, no dependent edema. GI: Abdomen soft, tender without rebound or guarding in the epigastrium, non-distended, with normoactive bowel sounds.  Ext: Warm, no deformities Skin: No rashes, lesions or ulcers on visualized skin. Neuro: Alert and oriented. No focal neurological deficits. Psych: Judgement and insight appear fair. Mood euthymic & affect congruent. Behavior is appropriate.    Data Personally reviewed: CBC: Recent Labs  Lab 12/17/21 0642 12/18/21 1425 12/20/21 1618 12/21/21 0415  WBC 14.3* 9.5 7.5 5.5  NEUTROABS 11.7* 7.5 5.2 2.8  HGB 13.5 12.5* 13.1 10.1*  HCT 40.3 36.1* 39.2 31.1*  MCV 101.0* 99.2 100.8* 103.3*  PLT 379 332 360 298   Basic Metabolic Panel: Recent Labs  Lab 12/17/21 0642 12/18/21 1425 12/20/21 1618 12/21/21 0415  NA 137 134* 139 139  K 4.8 3.6 3.3* 3.3*  CL 98 95* 91* 100  CO2 22 25 32 32  GLUCOSE 128* 214* 175* 162*  BUN 27* 23* 23* 25*  CREATININE 0.98 0.90 1.35* 0.91  CALCIUM 10.1 9.8 10.3 8.8*  MG  --   --   --  2.0  PHOS  --   --   --  3.0   GFR: Estimated Creatinine Clearance: 105.7 mL/min (by C-G formula based on SCr of 0.91 mg/dL). Liver Function Tests: Recent Labs  Lab 12/17/21 0642 12/18/21 1425 12/20/21 1618 12/21/21 0415  AST 30 26 26 18   ALT 23 18 19 15   ALKPHOS 51 47  51 36*  BILITOT 0.8 0.6 0.8 0.6  PROT 8.7* 8.3* 8.9* 6.5  ALBUMIN 4.9 4.5 4.9 3.5   Recent Labs  Lab 12/17/21 0642 12/18/21 1425 12/20/21 1618  LIPASE 88* 131* 172*   No results for input(s): "AMMONIA" in the last 168 hours. Coagulation Profile: No results for input(s): "INR", "PROTIME" in the last 168 hours. Cardiac Enzymes: No results for input(s): "CKTOTAL", "CKMB", "CKMBINDEX", "TROPONINI" in the last 168 hours. BNP (last 3 results) No results for input(s): "PROBNP" in the last 8760 hours. HbA1C: No results for input(s): "HGBA1C" in the last 72 hours. CBG: Recent Labs  Lab  12/21/21 0400 12/21/21 0430 12/21/21 0800 12/21/21 1111 12/21/21 1630  GLUCAP 65* 130* 113* 113* 118*   Lipid Profile: Recent Labs    12/21/21 0415  CHOL 148  HDL 41  LDLCALC 87  TRIG 102  CHOLHDL 3.6   Thyroid Function Tests: No results for input(s): "TSH", "T4TOTAL", "FREET4", "T3FREE", "THYROIDAB" in the last 72 hours. Anemia Panel: No results for input(s): "VITAMINB12", "FOLATE", "FERRITIN", "TIBC", "IRON", "RETICCTPCT" in the last 72 hours. Urine analysis:    Component Value Date/Time   COLORURINE YELLOW 12/18/2021 1425   APPEARANCEUR CLEAR 12/18/2021 1425   LABSPEC >=1.030 12/18/2021 1425   PHURINE 6.0 12/18/2021 1425   GLUCOSEU NEGATIVE 12/18/2021 1425   HGBUR NEGATIVE 12/18/2021 1425   BILIRUBINUR MODERATE (A) 12/18/2021 1425   KETONESUR 80 (A) 12/18/2021 1425   PROTEINUR 100 (A) 12/18/2021 1425   UROBILINOGEN 0.2 10/01/2014 0539   NITRITE NEGATIVE 12/18/2021 1425   LEUKOCYTESUR NEGATIVE 12/18/2021 1425   Family Communication: None at bedside  Disposition: Status is: Inpatient Remains inpatient appropriate because: Continue IVF, IV medications for acute pancreatitis Planned Discharge Destination: Home      Tyrone Nine, MD 12/21/2021 5:22 PM Page by [email or web address]

## 2021-12-21 NOTE — Progress Notes (Signed)
Hypoglycemic Event  CBG: 65   Treatment: D50 25 mL (12.5 gm)  Symptoms: None  Follow-up CBG: Time: 0430  CBG Result:130   Possible Reasons for Event: Inadequate meal intake  Comments/MD notified: Hypoglycemic protocol followed per standing orders.    Darryl Nestle

## 2021-12-22 DIAGNOSIS — K2980 Duodenitis without bleeding: Secondary | ICD-10-CM | POA: Diagnosis not present

## 2021-12-22 DIAGNOSIS — D539 Nutritional anemia, unspecified: Secondary | ICD-10-CM

## 2021-12-22 DIAGNOSIS — K859 Acute pancreatitis without necrosis or infection, unspecified: Secondary | ICD-10-CM | POA: Diagnosis not present

## 2021-12-22 LAB — IRON AND TIBC
Iron: 60 ug/dL (ref 45–182)
Saturation Ratios: 23 % (ref 17.9–39.5)
TIBC: 258 ug/dL (ref 250–450)
UIBC: 198 ug/dL

## 2021-12-22 LAB — BASIC METABOLIC PANEL
Anion gap: 6 (ref 5–15)
BUN: 13 mg/dL (ref 6–20)
CO2: 27 mmol/L (ref 22–32)
Calcium: 8.9 mg/dL (ref 8.9–10.3)
Chloride: 104 mmol/L (ref 98–111)
Creatinine, Ser: 0.56 mg/dL — ABNORMAL LOW (ref 0.61–1.24)
GFR, Estimated: >60 mL/min (ref >60)
Glucose, Bld: 94 mg/dL (ref 70–99)
Potassium: 4.6 mmol/L (ref 3.5–5.1)
Sodium: 137 mmol/L (ref 135–145)

## 2021-12-22 LAB — CBC
HCT: 33.2 % — ABNORMAL LOW (ref 39.0–52.0)
Hemoglobin: 10.6 g/dL — ABNORMAL LOW (ref 13.0–17.0)
MCH: 33.7 pg (ref 26.0–34.0)
MCHC: 31.9 g/dL (ref 30.0–36.0)
MCV: 105.4 fL — ABNORMAL HIGH (ref 80.0–100.0)
Platelets: 316 10*3/uL (ref 150–400)
RBC: 3.15 MIL/uL — ABNORMAL LOW (ref 4.22–5.81)
RDW: 12.7 % (ref 11.5–15.5)
WBC: 6.9 10*3/uL (ref 4.0–10.5)
nRBC: 0 % (ref 0.0–0.2)

## 2021-12-22 LAB — GLUCOSE, CAPILLARY
Glucose-Capillary: 89 mg/dL (ref 70–99)
Glucose-Capillary: 85 mg/dL (ref 70–99)
Glucose-Capillary: 79 mg/dL (ref 70–99)
Glucose-Capillary: 115 mg/dL — ABNORMAL HIGH (ref 70–99)
Glucose-Capillary: 233 mg/dL — ABNORMAL HIGH (ref 70–99)

## 2021-12-22 LAB — RETICULOCYTES
Immature Retic Fract: 10.2 % (ref 2.3–15.9)
RBC.: 3.04 MIL/uL — ABNORMAL LOW (ref 4.22–5.81)
Retic Count, Absolute: 31.6 10*3/uL (ref 19.0–186.0)
Retic Ct Pct: 1 % (ref 0.4–3.1)

## 2021-12-22 LAB — FOLATE: Folate: 29.8 ng/mL (ref >5.9)

## 2021-12-22 LAB — FERRITIN: Ferritin: 257 ng/mL (ref 24–336)

## 2021-12-22 LAB — VITAMIN B12: Vitamin B-12: 4636 pg/mL — ABNORMAL HIGH (ref 180–914)

## 2021-12-22 MED ORDER — LACTATED RINGERS IV SOLN: INTRAVENOUS | Status: DC

## 2021-12-22 MED ORDER — ENSURE ENLIVE PO LIQD
237.0000 mL | Freq: Two times a day (BID) | ORAL | Status: DC
  Administered 2021-12-22: 237 mL via ORAL

## 2021-12-22 NOTE — Progress Notes (Signed)
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  Currently his pain is relatively well controlled on regimen and he is getting IV fluid hydration.  Clear liquid diet was advanced to full liquid diet and will go to soft tonight.  If patient tolerates soft he can be discharged in the morning and follow-up in outpatient setting with gastroenterology.   Assessment and Plan: No notes have been filed under this hospital service. Service: Hospitalist  Acute recurrent pancreatitis in the setting of chronic pancreatitis -Unclear etiology of why he has recurrent pancreatitis this admission -Lipase 173, CT with inflammation of pancreas and adjacent duodenum as it read "Duodenitis versus groove pancreatitis. Inflammation is more centered on the duodenum, favoring duodenitis. Chronic pancreatitis.  Hepatic steatosis." -Started clears with plans to convert to oral medications once tolerating diet, advance as tolerated. -Continue IV medications and fluids until that time.  -Diet has been advanced from a clear liquid diet to full liquid diet and will go to soft tonight if able to tolerate -Continue with IV fluid hydration with lactated Ringers but discontinue the potassium portion given that his potassium is improved and reduce the rate to 100 MLS per hour -Continue pain control with hydromorphone 1 mg IV every 2 as needed for moderate-severe pain as well as oxycodone 5 mg every 6 as needed for moderate pain and breakthrough pain; also has 650 mg of acetaminophen p.o. every 6 as needed for mild pain  and fever and headache -Continue with supportive care and antiemetics with prochlorperazine 10 mg IV every 6 as needed for nausea vomiting -Pain is relatively well controlled ranging from 4-6 on a scale of 10 -If not improving would likely warrant a inpatient gastroenterology consultation but since he is can likely follow-up with his gastroenterologist in outpatient setting   Duodenitis -Regional inflammation from pancreas affecting duodenum. -Note he had EGD with Dr. Henrene Pastor in 2018 which showed extrinsic deformity at that area due to inflammation.  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    Macrocytic Anemia -Likely had a dilutional drop on admission given that he was likely hemoconcentrated; patient's hemoglobin/hematocrit went from 13.1/39.2 and trended down to 10.1/31.1 but is now improved to 10.6/33.2 with an MCV of 105.4 -Anemia panel done and showed an iron level of 60, U IBC of 198, TIBC 258, saturation ratios of 23, ferritin level 257, folate level of 29.8 and vitamin B12 4636 -Continue to monitor for signs and symptoms of bleeding; no overt bleeding noted -Repeat CBC in a.m.   Hypokalemia -Improved and potassium went from 3.3 is now 4.6 -Mag level was 2.0 yesterday -Continue monitor and and replete as necessary -Repeat CMP in a.m.   AKI, prerenal -Resolved.  Patient's BUNs/creatinine ratio went from 23/1.35 is now 13/0.56 -Getting IV fluid hydration as above -Avoid nephrotoxic medications, contrast dyes, hypotension and dehydration to ensure adequate renal perfusion and renally dose medications -Repeat CMP in the a.m.   Hyperlipidemia -Patient's lipid panel done 1 showed a total cholesterol/HDL ratio of 3.6, cholesterol level 148, HDL  41, LDL of 87, triglycerides 102, VLDL 20   Alcohol abuse -Has been in sustained remission. -If necessary will place on CIWA protocol but no need at  this time   NIDT2DM -Uncontrolled with hyperglycemia, HbA1c 7.4%. -Continue SSI, change to AC/HS if he tolerates liquids and is now on full's -CBGs ranging from 79-136  DVT prophylaxis: enoxaparin (LOVENOX) injection 40 mg Start: 12/20/21 2200    Code Status: Full Code Family Communication: No family currently at bedside  Disposition Plan:  Level of care: Med-Surg Status is: Inpatient Remains inpatient appropriate because: Needs to tolerate a soft diet prior to safe discharge disposition; anticipating discharge in the next 24 to 48 hours if able to tolerate diet   Consultants:  None  Procedures:  None  Antimicrobials:  Anti-infectives (From admission, onward)    None       Subjective: Seen and examined at bedside and thinks he is doing better today than he was yesterday.  Happy that his diet is being advanced from clear to full.  States his pain is ranging from a 4-6 out of 10 in severity with 6 being its worst.  States his pain is much better than when he initially presented.  Denies any other concerns or complaints at this time.  Feels improved.  Objective: Vitals:   12/21/21 1242 12/21/21 2035 12/22/21 0409 12/22/21 1208  BP: 133/84 (!) 137/98 (!) 142/88 137/90  Pulse: (!) 56 65 (!) 55 74  Resp: 16 18 16 18   Temp: 97.7 F (36.5 C) 98.3 F (36.8 C) 97.9 F (36.6 C) 98.1 F (36.7 C)  TempSrc: Oral Oral Oral Oral  SpO2: 97% 100% 100% 97%  Weight:      Height:        Intake/Output Summary (Last 24 hours) at 12/22/2021 1226 Last data filed at 12/22/2021 1139 Gross per 24 hour  Intake 3964.12 ml  Output 500 ml  Net 3464.12 ml   Filed Weights   12/20/21 2013  Weight: 81.5 kg   Examination: Physical Exam:  Constitutional: WN/WD Caucasian male no acute distress Respiratory: Diminished to auscultation bilaterally, no wheezing, rales, rhonchi or crackles. Normal respiratory effort and patient is not tachypenic. No accessory muscle use.  Unlabored  breathing Cardiovascular: RRR, no murmurs / rubs / gallops. S1 and S2 auscultated. No extremity edema.  Abdomen: Soft, mildly-tender, non-distended. Bowel sounds positive.  GU: Deferred. Musculoskeletal: No clubbing / cyanosis of digits/nails. No joint deformity upper and lower extremities.  Skin: No rashes, lesions, ulcers on limited skin evaluation but has multiple tattoos noted on his arms. No induration; Warm and dry.  Neurologic: CN 2-12 grossly intact with no focal deficits.  Romberg sign and cerebellar reflexes not assessed.  Psychiatric: Normal judgment and insight. Alert and oriented x 3. Normal mood and appropriate affect.   Data Reviewed: I have personally reviewed following labs and imaging studies  CBC: Recent Labs  Lab 12/17/21 0642 12/18/21 1425 12/20/21 1618 12/21/21 0415 12/22/21 0406  WBC 14.3* 9.5 7.5 5.5 6.9  NEUTROABS 11.7* 7.5 5.2 2.8  --   HGB 13.5 12.5* 13.1 10.1* 10.6*  HCT 40.3 36.1* 39.2 31.1* 33.2*  MCV 101.0* 99.2 100.8* 103.3* 105.4*  PLT 379 332 360 298 316   Basic Metabolic Panel: Recent Labs  Lab 12/17/21 0642 12/18/21 1425 12/20/21 1618 12/21/21 0415 12/22/21 0406  NA 137 134* 139 139 137  K 4.8 3.6 3.3* 3.3* 4.6  CL 98 95* 91* 100 104  CO2 22 25 32 32 27  GLUCOSE 128* 214* 175* 162* 94  BUN 27* 23* 23* 25* 13  CREATININE 0.98 0.90 1.35* 0.91 0.56*  CALCIUM 10.1 9.8 10.3 8.8* 8.9  MG  --   --   --  2.0  --   PHOS  --   --   --  3.0  --    GFR: Estimated Creatinine Clearance: 120.3 mL/min (A) (by C-G formula based on SCr of 0.56 mg/dL (L)). Liver Function Tests: Recent Labs  Lab 12/17/21 8127 12/18/21 1425 12/20/21 1618 12/21/21 0415  AST 30 26 26 18   ALT 23 18 19 15   ALKPHOS 51 47 51 36*  BILITOT 0.8 0.6 0.8 0.6  PROT 8.7* 8.3* 8.9* 6.5  ALBUMIN 4.9 4.5 4.9 3.5   Recent Labs  Lab 12/17/21 0642 12/18/21 1425 12/20/21 1618  LIPASE 88* 131* 172*   No results for input(s): "AMMONIA" in the last 168 hours. Coagulation  Profile: No results for input(s): "INR", "PROTIME" in the last 168 hours. Cardiac Enzymes: No results for input(s): "CKTOTAL", "CKMB", "CKMBINDEX", "TROPONINI" in the last 168 hours. BNP (last 3 results) No results for input(s): "PROBNP" in the last 8760 hours. HbA1C: No results for input(s): "HGBA1C" in the last 72 hours. CBG: Recent Labs  Lab 12/21/21 2033 12/21/21 2344 12/22/21 0406 12/22/21 0730 12/22/21 1056  GLUCAP 94 88 89 85 79   Lipid Profile: Recent Labs    12/21/21 0415  CHOL 148  HDL 41  LDLCALC 87  TRIG 102  CHOLHDL 3.6   Thyroid Function Tests: No results for input(s): "TSH", "T4TOTAL", "FREET4", "T3FREE", "THYROIDAB" in the last 72 hours. Anemia Panel: Recent Labs    12/22/21 0406  VITAMINB12 4,636*  FOLATE 29.8  FERRITIN 257  TIBC 258  IRON 60  RETICCTPCT 1.0   Sepsis Labs: Recent Labs  Lab 12/17/21 12/24/21  LATICACIDVEN 1.9   No results found for this or any previous visit (from the past 240 hour(s)).   Radiology Studies: No results found.  Scheduled Meds:  enoxaparin (LOVENOX) injection  40 mg Subcutaneous QHS   insulin aspart  0-9 Units Subcutaneous Q4H   pantoprazole (PROTONIX) IV  40 mg Intravenous QHS   Continuous Infusions:  lactated ringers 1,000 mL with potassium chloride 40 mEq infusion 125 mL/hr at 12/22/21 0653    LOS: 1 day   [id number], DO [facility] Available via Epic secure chat 7am-7pm After these hours, please refer to coverage provider listed on [email or web address] 12/22/2021, 12:26 PM

## 2021-12-22 NOTE — Hospital Course (Signed)
The patient is a 56 year old Caucasian male with past medical history significant for but not limited to alcohol abuse in remission and tobacco use in remission, diabetes mellitus type 2, hypertension, hyperlipidemia as well as recurrent pancreatitis was admitted on 12/20/2021 for recurrent pancreatitis after failing outpatient management.  Currently his pain is relatively well controlled on regimen and he is getting IV fluid hydration.  Clear liquid diet was advanced to full liquid diet and will want to soft diet and tolerated it so he is going to be discharged this morning with outpatient PCP and gastroenterology follow-up during he is medically stable and improved significantly since the time of admission.

## 2021-12-22 NOTE — Progress Notes (Signed)
Initial Nutrition Assessment  DOCUMENTATION CODES:   Not applicable  INTERVENTION:  Encourage PO intake Ensure Enlive po BID, each supplement provides 350 kcal and 20 grams of protein.  NUTRITION DIAGNOSIS:   Increased nutrient needs related to acute illness as evidenced by estimated needs.  GOAL:   Patient will meet greater than or equal to 90% of their needs  MONITOR:   PO intake, Supplement acceptance, Labs, Weight trends, Diet advancement  REASON FOR ASSESSMENT:   Malnutrition Screening Tool    ASSESSMENT:   Pt admitted with upper abdominal pain secondary to acute recurrent pancreatitis. PMH significant for former alcohol use disorder, former tobacco use disorder, T2DM, HTN and HLD.  Pt reports having a prior acute pancreatitis flare about 2-3 years ago as well as a new diagnosis of diabetes. Since then he reports changing his diet. He prepares almost all of his meals at home and rarely eats red meat. He usually has fruits, vegetables, lean/grilled meats and eats low fat and only natural sugar. Pt reports that he has had a decrease in PO intake since Saturday/Sunday as he had episodes of emesis and has been unable to keep solids and liquids down d/t pancreatitis flare. He is uncertain of what caused this acute flare but reports stressors d/t work. He is a Naval architect and a Psychologist, educational. He reports that today he is doing much better and has been eating 100% of each meal on his full liquid diet and is tolerating it well.   He states that his wt usually remains around 180-185 lbs and denies significant wt loss. He states that when he loses wt after an acute pancreatic flare, he usually quickly regains his wt. Reviewed documented wt history. 01/30 his wt was noted to be 90.6 kg and his current wt is 81.5 kg. This is a 10% wt loss within the last 6 months.   Medications: SSI 0-9 units Q4H, protonix, LR @100ml /hr  Labs: Cr 0.56, alkaline phosphatase 36, lipase 172 (06/28),  CBG's  79-118 x24 hours, HgbA1c 7.4% (01/30)   NUTRITION - FOCUSED PHYSICAL EXAM:  Flowsheet Row Most Recent Value  Orbital Region Mild depletion  Upper Arm Region No depletion  Thoracic and Lumbar Region No depletion  Buccal Region No depletion  Temple Region Mild depletion  Clavicle Bone Region No depletion  Clavicle and Acromion Bone Region No depletion  Scapular Bone Region No depletion  Dorsal Hand No depletion  Patellar Region No depletion  Anterior Thigh Region No depletion  Posterior Calf Region No depletion  Edema (RD Assessment) None  Hair Reviewed  Eyes Reviewed  Mouth Reviewed  Skin Reviewed  Nails Reviewed      Diet Order:   Diet Order             Diet full liquid Room service appropriate? Yes; Fluid consistency: Thin  Diet effective now                   EDUCATION NEEDS:   Education needs have been addressed  Skin:  Skin Assessment: Reviewed RN Assessment  Last BM:  PTA  Height:   Ht Readings from Last 1 Encounters:  12/20/21 6\' 2"  (1.88 m)    Weight:   Wt Readings from Last 1 Encounters:  12/20/21 81.5 kg   BMI:  Body mass index is 23.07 kg/m.  Estimated Nutritional Needs:   Kcal:  2200-2400  Protein:  100-115g  Fluid:  >/=2.2L  , RDN, LDN Clinical Nutrition

## 2021-12-22 NOTE — Progress Notes (Signed)
Chaplain followed up with Samuel Huffman regarding HCPOA.  He wanted to confirm that he does not need HCPOA if his son is his legal next of kin.  Chaplain confirmed this and he declined filling out paperwork.  Chaplain provided social support as he shared about his health journey and about his grandbaby who is due any day now. He is hoping to be d/c before the baby is born so he can be there.  [address] Bcc Pager, [phone]

## 2021-12-23 LAB — CBC WITH DIFFERENTIAL/PLATELET
Abs Immature Granulocytes: 0.03 10*3/uL (ref 0.00–0.07)
Basophils Absolute: 0.1 10*3/uL (ref 0.0–0.1)
Basophils Relative: 2 %
Eosinophils Absolute: 0.7 10*3/uL — ABNORMAL HIGH (ref 0.0–0.5)
Eosinophils Relative: 14 %
HCT: 31.5 % — ABNORMAL LOW (ref 39.0–52.0)
Hemoglobin: 10.2 g/dL — ABNORMAL LOW (ref 13.0–17.0)
Immature Granulocytes: 1 %
Lymphocytes Relative: 36 %
Lymphs Abs: 2 10*3/uL (ref 0.7–4.0)
MCH: 33.8 pg (ref 26.0–34.0)
MCHC: 32.4 g/dL (ref 30.0–36.0)
MCV: 104.3 fL — ABNORMAL HIGH (ref 80.0–100.0)
Monocytes Absolute: 0.8 10*3/uL (ref 0.1–1.0)
Monocytes Relative: 16 %
Neutro Abs: 1.6 10*3/uL — ABNORMAL LOW (ref 1.7–7.7)
Neutrophils Relative %: 31 %
Platelets: 333 10*3/uL (ref 150–400)
RBC: 3.02 MIL/uL — ABNORMAL LOW (ref 4.22–5.81)
RDW: 12.3 % (ref 11.5–15.5)
WBC: 5.3 10*3/uL (ref 4.0–10.5)
nRBC: 0 % (ref 0.0–0.2)

## 2021-12-23 LAB — COMPREHENSIVE METABOLIC PANEL
ALT: 16 U/L (ref 0–44)
AST: 22 U/L (ref 15–41)
Albumin: 3.4 g/dL — ABNORMAL LOW (ref 3.5–5.0)
Alkaline Phosphatase: 44 U/L (ref 38–126)
Anion gap: 5 (ref 5–15)
BUN: 10 mg/dL (ref 6–20)
CO2: 28 mmol/L (ref 22–32)
Calcium: 9.1 mg/dL (ref 8.9–10.3)
Chloride: 103 mmol/L (ref 98–111)
Creatinine, Ser: 0.79 mg/dL (ref 0.61–1.24)
GFR, Estimated: >60 mL/min (ref >60)
Glucose, Bld: 227 mg/dL — ABNORMAL HIGH (ref 70–99)
Potassium: 4.5 mmol/L (ref 3.5–5.1)
Sodium: 136 mmol/L (ref 135–145)
Total Bilirubin: 0.5 mg/dL (ref 0.3–1.2)
Total Protein: 6.1 g/dL — ABNORMAL LOW (ref 6.5–8.1)

## 2021-12-23 LAB — GLUCOSE, CAPILLARY
Glucose-Capillary: 175 mg/dL — ABNORMAL HIGH (ref 70–99)
Glucose-Capillary: 219 mg/dL — ABNORMAL HIGH (ref 70–99)
Glucose-Capillary: 61 mg/dL — ABNORMAL LOW (ref 70–99)
Glucose-Capillary: 74 mg/dL (ref 70–99)
Glucose-Capillary: 96 mg/dL (ref 70–99)

## 2021-12-23 LAB — MAGNESIUM: Magnesium: 1.6 mg/dL — ABNORMAL LOW (ref 1.7–2.4)

## 2021-12-23 LAB — PHOSPHORUS: Phosphorus: 3.5 mg/dL (ref 2.5–4.6)

## 2021-12-23 LAB — LIPASE, BLOOD: Lipase: 31 U/L (ref 11–51)

## 2021-12-23 MED ORDER — MAGNESIUM SULFATE 2 GM/50ML IV SOLN
2.0000 g | Freq: Once | INTRAVENOUS | Status: AC
  Administered 2021-12-23: 2 g via INTRAVENOUS
  Filled 2021-12-23: qty 50

## 2021-12-23 MED ORDER — ACETAMINOPHEN 325 MG PO TABS: 650.0000 mg | ORAL_TABLET | Freq: Four times a day (QID) | ORAL | 0 refills | Status: DC | PRN

## 2021-12-23 MED ORDER — ENSURE ENLIVE PO LIQD: 237.0000 mL | Freq: Two times a day (BID) | ORAL | 12 refills | Status: DC

## 2021-12-23 MED ORDER — POLYETHYLENE GLYCOL 3350 17 G PO PACK: 17.0000 g | PACK | Freq: Every day | ORAL | 0 refills | Status: DC | PRN

## 2021-12-23 NOTE — Progress Notes (Signed)
Hypoglycemic Event  CBG: 61  Treatment: 8 oz juice/soda  Symptoms: None  Follow-up CBG: Time:0820 CBG Result:74  Possible Reasons for Event: Unknown  Comments/MD notified:    Avelina Laine

## 2021-12-23 NOTE — Progress Notes (Signed)
AVS given to patient and explained at the bedside. Medications and follow up appointments have been explained with pt verbalizing understanding.  

## 2021-12-23 NOTE — Discharge Summary (Signed)
Physician Discharge Summary   Patient: Samuel OliveRichard T Spielmann MRN: [id number] DOB: 1965-12-26  Admit date:     12/20/2021  Discharge date: 12/23/21  Discharge Physician: Marguerita Merlesmair Amiel Mccaffrey, DO   PCP: Pearline Cablesopland, Jessica C, MD   Recommendations at discharge:   Follow-up with PCP within 1 to 2 weeks and repeat CBC, CMP, mag, Phos within 1 week Follow-up with gastroenterology Dr. Marina GoodellPerry within 1 to 2 weeks for hospital follow-up and further evaluation of recurrent pancreatitis  Discharge Diagnoses: Principal Problem:   Acute recurrent pancreatitis  Resolved Problems:   * No resolved hospital problems. 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  Currently his pain is relatively well controlled on regimen and he is getting IV fluid hydration.  Clear liquid diet was advanced to full liquid diet and will want to soft diet and tolerated it so he is going to be discharged this morning with outpatient PCP and gastroenterology follow-up during he is medically stable and improved significantly since the time of admission.    Assessment and Plan: No notes have been filed under this hospital service. Service: Hospitalist  Acute recurrent pancreatitis in the setting of chronic pancreatitis, improved -Unclear etiology of why he has recurrent pancreatitis this admission -Lipase 173 on admission and repeat was 31, CT with inflammation of pancreas and adjacent duodenum as it read "Duodenitis versus groove pancreatitis. Inflammation is more centered on the duodenum, favoring duodenitis. Chronic pancreatitis.  Hepatic steatosis." -Started clears with plans to convert to oral medications once tolerating diet,  advance as tolerated. -Continue IV medications and fluids until that time.  -Diet has been advanced from a clear liquid diet to full liquid diet and will go to soft tonight if able to tolerate -Continue with IV fluid hydration with lactated Ringers but discontinue the potassium portion given that his potassium is improved and reduce the rate to 100 MLS per hour -Continue pain control with hydromorphone 1 mg IV every 2 as needed for moderate-severe pain as well as oxycodone 5 mg every 6 as needed for moderate pain and breakthrough pain; also has 650 mg of acetaminophen p.o. every 6 as needed for mild pain and fever and headache -Continue with supportive care and antiemetics with prochlorperazine 10 mg IV every 6 as needed for nausea vomiting -Pain is relatively well controlled ranging from 4-6 on a scale of 10 -If not improving would likely warrant a inpatient gastroenterology consultation but since he is can likely follow-up with his gastroenterologist in outpatient setting -Pain is well controlled and will be discharged on his home regimen and he can follow-up with gastroenterology in outpatient setting   Duodenitis -Regional inflammation from pancreas affecting duodenum.  -Note he had EGD with Dr. Marina GoodellPerry in 2018 which showed extrinsic deformity at that area due to inflammation.  -Continue PPI by IV at 40 mg of pantoprazole while hospitalized and changed to p.o. 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    Macrocytic Anemia -Likely had a dilutional drop on admission given that he was likely hemoconcentrated; patient's hemoglobin/hematocrit went from 13.1/39.2 and trended down to 10.2/31.5 with an MCV of 104.3 -Anemia panel done and showed an iron level  of 60, U IBC of 198, TIBC 258, saturation ratios of 23, ferritin level 257, folate level of 29.8 and vitamin B12 4636 -Continue to monitor for  signs and symptoms of bleeding; no overt bleeding noted -Repeat CBC within 1 week   Hypokalemia -Improved and potassium is 4.5 at the time of discharge -Mag level was 1.6 this a.m. and was repleted with IV mag sulfate 2 g prior to discharge -Continue monitor and and replete as necessary -Repeat CMP in a.m.  Hypomagnesemia -Patient's mag level was 1.6 this morning -Replete with IV mag sulfate 2 g -Continue to monitor and replete as necessary -Repeat mag level in outpatient setting   AKI, prerenal -Resolved.  Patient's BUNs/creatinine ratio went from 23/1.35 is now 13/0.56 yesterday and today it is 10/0.79 improved -Getting IV fluid hydration as above -Avoid nephrotoxic medications, contrast dyes, hypotension and dehydration to ensure adequate renal perfusion and renally dose medications -Repeat CMP within 1 week   Hyperlipidemia -Patient's lipid panel done 1 showed a total cholesterol/HDL ratio of 3.6, cholesterol level 148, HDL 41, LDL of 87, triglycerides 102, VLDL 20 -Follow-up in outpatient setting with PCP   Alcohol abuse -Has been in sustained remission. -If necessary will place on CIWA protocol but no need at this time   NIDT2DM -Uncontrolled with hyperglycemia, HbA1c 7.4%. -Continue SSI, change to AC/HS if he tolerates liquids and is now on full's -CBGs ranging from 61-2 19   Nutrition Documentation    Flowsheet Row ED to Hosp-Admission (Current) from 12/20/2021 in [facility] 4TH FLOOR PROGRESSIVE CARE AND UROLOGY  Nutrition Problem Increased nutrient needs  Etiology acute illness  Nutrition Goal Patient will meet greater than or equal to 90% of their needs  Interventions Ensure Enlive (each supplement provides 350kcal and 20 grams of protein)      Consultants: None Procedures performed: None  Disposition: Home Diet recommendation:  Regular diet DISCHARGE MEDICATION: Allergies as of 12/23/2021   No Known Allergies      Medication List     TAKE these  medications    acetaminophen 325 MG tablet Commonly known as: TYLENOL Take 2 tablets (650 mg total) by mouth every 6 (six) hours as needed for mild pain, fever or headache. What changed:  medication strength how much to take when to take this reasons to take this   aspirin EC 81 MG tablet Take 81 mg by mouth daily.   feeding supplement Liqd Take 237 mLs by mouth 2 (two) times daily between meals.   glimepiride 4 MG tablet Commonly known as: AMARYL TAKE 1 TABLET(4 MG) BY MOUTH DAILY WITH BREAKFAST What changed:  how much to take how to take this when to take this additional instructions   HYDROcodone-acetaminophen 5-325 MG tablet Commonly known as: NORCO/VICODIN Take 1 tablet by mouth every 6 (six) hours as needed.   losartan 50 MG tablet Commonly known as: COZAAR TAKE 1 TABLET(50 MG) BY MOUTH DAILY What changed:  how much to take how to take this when to take this additional instructions   metFORMIN 750 MG 24 hr tablet Commonly known as: GLUCOPHAGE-XR TAKE 1 TABLET(750 MG) BY MOUTH TWICE DAILY WITH A MEAL What changed:  how much to take how to take this when to take this additional instructions   Multivitamin Adult Chew Chew 1 each by mouth daily.   ondansetron 8 MG disintegrating tablet Commonly known as: ZOFRAN-ODT Take 1 tablet (8 mg total) by mouth every 8 (eight) hours as needed for nausea or vomiting.  pantoprazole 40 MG tablet Commonly known as: Protonix Take 1 tablet (40 mg total) by mouth daily.   polyethylene glycol 17 g packet Commonly known as: MIRALAX / GLYCOLAX Take 17 g by mouth daily as needed for mild constipation.        Follow-up Information     Copland, Gwenlyn Found, MD. Call.   Specialty: Family Medicine Why: Follow up with PCP within 1 week Contact information: [address] [place] [address] [phone]         Hilarie Fredrickson, MD Follow up.   Specialty: Gastroenterology Why: Follow up within 1-2  weeks Contact information: [address] [address] [phone]                Discharge Exam: Filed Weights   12/20/21 2013  Weight: 81.5 kg   Vitals:   12/22/21 2111 12/23/21 0527  BP: 135/83 123/78  Pulse: 67 (!) 55  Resp: 16 17  Temp:  98 F (36.7 C)  SpO2: 99% 100%   Examination: Physical Exam:  Constitutional: WN/WD, NAD and appears calm and comfortable Respiratory: Mildly diminished to auscultation bilaterally, no wheezing, rales, rhonchi or crackles. Normal respiratory effort and patient is not tachypenic. No accessory muscle use.  Unlabored breathing Cardiovascular: RRR, no murmurs / rubs / gallops. S1 and S2 auscultated. No extremity edema.  Abdomen: Soft, non-tender, non-distended. Bowel sounds positive.  GU: Deferred. Musculoskeletal: No clubbing / cyanosis of digits/nails. No joint deformity upper and lower extremities.  Skin: No rashes, lesions, ulcers on limited skin evaluation. No induration; Warm and dry.  Neurologic: CN 2-12 grossly intact with no focal deficits.  Romberg sign and cerebellar reflexes not assessed.  Psychiatric: Normal judgment and insight. Alert and oriented x 3. Normal mood and appropriate affect.   Condition at discharge: stable  The results of significant diagnostics from this hospitalization (including imaging, microbiology, ancillary and laboratory) are listed below for reference.   Imaging Studies: CT ABDOMEN PELVIS W CONTRAST  Result Date: 12/17/2021 CLINICAL DATA:  Bowel obstruction suspected. Acute nonlocalized abdominal pain. History of pancreatitis. EXAM: CT ABDOMEN AND PELVIS WITH CONTRAST TECHNIQUE: Multidetector CT imaging of the abdomen and pelvis was performed using the standard protocol following bolus administration of intravenous contrast. RADIATION DOSE REDUCTION: This exam was performed according to the departmental dose-optimization program which includes automated exposure control, adjustment of  the mA and/or kV according to patient size and/or use of iterative reconstruction technique. CONTRAST:  OMNIPAQUE IOHEXOL 300 MG/ML  SOLN COMPARISON:  05/27/2020 FINDINGS: Lower chest:  No no acute finding Hepatobiliary: Hepatic steatosis.No calcified gallstone or ductal dilatation. Pancreas: Chronic pancreatitis with diffuse coarse calcification and intermittent ductal dilatation. There is inflammation near the head of the pancreas which is primarily centered on the duodenum. Spleen: Small and partially calcified splenic tissue around clips in the left upper quadrant, likely splenosis after splenectomy Adrenals/Urinary Tract: Negative adrenals. No hydronephrosis or stone. Small layering calcification in the left dependent bladder. Stomach/Bowel: Inflammation surrounding the thick walled duodenum. No perforation or gastric outlet obstruction. Diffuse formed stool. Vascular/Lymphatic: Chronic splenic vein occlusion with well-formed collaterals. No acute vascular finding . Negative for mass or adenopathy Reproductive:No pathologic findings. Other: Mild fatty enlargement of the left inguinal canal. Musculoskeletal: Remote left femoral nail fixation. No acute fracture or malalignment. IMPRESSION: 1. Duodenitis versus groove pancreatitis. Inflammation is more centered on the duodenum, favoring duodenitis. 2. Chronic pancreatitis.  Hepatic steatosis. Electronically Signed   By: Tiburcio Pea  M.D.   On: 12/17/2021 08:17    Microbiology: Results for orders placed or performed during the hospital encounter of 05/27/20  Resp Panel by RT-PCR (Flu A&B, Covid) Nasopharyngeal Swab     Status: None   Collection Time: 05/27/20  9:44 PM   Specimen: Nasopharyngeal Swab; Nasopharyngeal(NP) swabs in vial transport medium  Result Value Ref Range Status   SARS Coronavirus 2 by RT PCR NEGATIVE NEGATIVE Final    Comment: (NOTE) SARS-CoV-2 target nucleic acids are NOT DETECTED.  The SARS-CoV-2 RNA is generally detectable  in upper respiratory specimens during the acute phase of infection. The lowest concentration of SARS-CoV-2 viral copies this assay can detect is 138 copies/mL. A negative result does not preclude SARS-Cov-2 infection and should not be used as the sole basis for treatment or other patient management decisions. A negative result may occur with  improper specimen collection/handling, submission of specimen other than nasopharyngeal swab, presence of viral mutation(s) within the areas targeted by this assay, and inadequate number of viral copies(<138 copies/mL). A negative result must be combined with clinical observations, patient history, and epidemiological information. The expected result is Negative.  Fact Sheet for Patients:  BloggerCourse.com  Fact Sheet for Healthcare Providers:  SeriousBroker.it  This test is no t yet approved or cleared by the [place] FDA and  has been authorized for detection and/or diagnosis of SARS-CoV-2 by FDA under an Emergency Use Authorization (EUA). This EUA will remain  in effect (meaning this test can be used) for the duration of the COVID-19 declaration under Section 564(b)(1) of the Act, 21 U.S.C.section 360bbb-3(b)(1), unless the authorization is terminated  or revoked sooner.       Influenza A by PCR NEGATIVE NEGATIVE Final   Influenza B by PCR NEGATIVE NEGATIVE Final    Comment: (NOTE) The Xpert Xpress SARS-CoV-2/FLU/RSV plus assay is intended as an aid in the diagnosis of influenza from Nasopharyngeal swab specimens and should not be used as a sole basis for treatment. Nasal washings and aspirates are unacceptable for Xpert Xpress SARS-CoV-2/FLU/RSV testing.  Fact Sheet for Patients: BloggerCourse.com  Fact Sheet for Healthcare Providers: SeriousBroker.it  This test is not yet approved or cleared by the [place] FDA and has  been authorized for detection and/or diagnosis of SARS-CoV-2 by FDA under an Emergency Use Authorization (EUA). This EUA will remain in effect (meaning this test can be used) for the duration of the COVID-19 declaration under Section 564(b)(1) of the Act, 21 U.S.C. section 360bbb-3(b)(1), unless the authorization is terminated or revoked.  Performed at [facility], [address] [place], [place] [address]    Labs: CBC: Recent Labs  Lab 12/17/21 [phone] 12/18/21 1425 12/20/21 1618 12/21/21 0415 12/22/21 0406 12/23/21 0411  WBC 14.3* 9.5 7.5 5.5 6.9 5.3  NEUTROABS 11.7* 7.5 5.2 2.8  --  1.6*  HGB 13.5 12.5* 13.1 10.1* 10.6* 10.2*  HCT 40.3 36.1* 39.2 31.1* 33.2* 31.5*  MCV 101.0* 99.2 100.8* 103.3* 105.4* 104.3*  PLT 379 332 360 298 316 333   Basic Metabolic Panel: Recent Labs  Lab 12/18/21 1425 12/20/21 1618 12/21/21 0415 12/22/21 0406 12/23/21 0411  NA 134* 139 139 137 136  K 3.6 3.3* 3.3* 4.6 4.5  CL 95* 91* 100 104 103  CO2 25 32 32 27 28  GLUCOSE 214* 175* 162* 94 227*  BUN 23* 23* 25* 13 10  CREATININE 0.90 1.35* 0.91 0.56* 0.79  CALCIUM 9.8 10.3 8.8* 8.9 9.1  MG  --   --  2.0  --  1.6*  PHOS  --   --  3.0  --  3.5   Liver Function Tests: Recent Labs  Lab 12/17/21 0642 12/18/21 1425 12/20/21 1618 12/21/21 0415 12/23/21 0411  AST 30 26 26 18 22   ALT 23 18 19 15 16   ALKPHOS 51 47 51 36* 44  BILITOT 0.8 0.6 0.8 0.6 0.5  PROT 8.7* 8.3* 8.9* 6.5 6.1*  ALBUMIN 4.9 4.5 4.9 3.5 3.4*   CBG: Recent Labs  Lab 12/23/21 0029 12/23/21 0528 12/23/21 0742 12/23/21 0820 12/23/21 1137  GLUCAP 96 175* 61* 74 219*   Discharge time spent: greater than 30 minutes.  Signed: 02/23/22, DO [facility] 12/23/2021

## 2022-01-20 NOTE — Progress Notes (Unsigned)
[facility] at [facility] [address] [place], [place] [address] [phone]  Date:  01/22/2022   Name:  Samuel Huffman   DOB:  [birth year]   MRN:  [id number]  PCP:  Pearline Cables, MD    Chief Complaint: No chief complaint on file.   History of Present Illness:  Samuel Huffman is a 56 y.o. very pleasant male patient who presents with the following:  Patient seen today for periodic follow-up Most recent visit with myself was in January Unfortunately since that time he has been in the ER twice and admitted once with pancreatitis History of diabetes with history of DKA, recurrent pancreatitis, cirrhosis, GERD, splenectomy, hypertension  Admit date:     12/20/2021  Discharge date: 12/23/21  Discharge Physician: Marguerita Merles, DO   Follow-up with PCP within 1 to 2 weeks and repeat CBC, CMP, mag, Phos within 1 week Follow-up with gastroenterology Dr. 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  Currently his pain is relatively well controlled on regimen and he is getting IV fluid hydration.  Clear liquid diet was advanced to full liquid diet and will want to soft diet and tolerated it so he is going to be discharged this morning with outpatient PCP and gastroenterology follow-up during he is medically stable and improved significantly since the time of admission.     Assessment and Plan:  Acute recurrent pancreatitis in the setting of chronic pancreatitis,  improved -Unclear etiology of why he has recurrent pancreatitis this admission -Lipase 173 on admission and repeat was 31, CT with inflammation of pancreas and adjacent duodenum as it read "Duodenitis versus groove pancreatitis. Inflammation is more centered on the duodenum, favoring duodenitis. Chronic pancreatitis.  Hepatic steatosis." -Started clears with plans to convert to oral medications once tolerating diet, advance as tolerated. -Continue IV medications and fluids until that time.  -Diet has been advanced from a clear liquid diet to full liquid diet and will go to soft tonight if able to tolerate -Continue with IV fluid hydration with lactated Ringers but discontinue the potassium portion given that his potassium is improved and reduce the rate to 100 MLS per hour -Continue pain control with hydromorphone 1 mg IV every 2 as needed for moderate-severe pain as well as oxycodone 5 mg every 6 as needed for moderate pain and breakthrough pain; also has 650 mg of acetaminophen p.o. every 6 as needed for mild pain and fever and headache -Continue with supportive care and antiemetics with prochlorperazine 10 mg IV every 6 as needed for nausea vomiting -Pain is relatively well controlled ranging from 4-6 on a scale of 10 -If not improving would likely warrant a inpatient gastroenterology consultation but since he is can likely follow-up with his gastroenterologist in outpatient setting -Pain is well controlled and will be discharged on his home regimen and he can follow-up with gastroenterology in outpatient setting   Duodenitis -Regional inflammation from pancreas affecting duodenum.  -Note he had EGD with Dr. Marina Goodell in 2018 which showed extrinsic deformity at that area due  to inflammation.  -Continue PPI by IV at 40 mg of pantoprazole while hospitalized and changed to p.o. 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  Macrocytic Anemia -Likely had a dilutional drop on admission given that he was likely hemoconcentrated; patient's hemoglobin/hematocrit went from 13.1/39.2 and trended down to 10.2/31.5 with an MCV of 104.3 -Anemia panel done and showed an iron level of 60, U IBC of 198, TIBC 258, saturation ratios of 23, ferritin level 257, folate level of 29.8 and vitamin B12 4636 -Continue to monitor for signs and symptoms of bleeding; no overt bleeding noted -Repeat CBC within 1 week Hypokalemia -Improved and potassium is 4.5 at the time of discharge -Mag level was 1.6 this a.m. and was repleted with IV mag sulfate 2 g prior to discharge -Continue monitor and and replete as necessary -Repeat CMP in a.m. Hypomagnesemia -Patient's mag level was 1.6 this morning -Replete with IV mag sulfate 2 g -Continue to monitor and replete as necessary -Repeat mag level in outpatient setting AKI, prerenal -Resolved.  Patient's BUNs/creatinine ratio went from 23/1.35 is now 13/0.56 yesterday and today it is 10/0.79 improved -Getting IV fluid hydration as above -Avoid nephrotoxic medications, contrast dyes, hypotension and dehydration to ensure adequate renal perfusion and renally dose medications -Repeat CMP within 1 week Hyperlipidemia -Patient's lipid panel done 1 showed a total cholesterol/HDL ratio of 3.6, cholesterol level 148, HDL 41, LDL of 87, triglycerides 102, VLDL 20 -Follow-up in outpatient setting with PCP Alcohol abuse -Has been in sustained remission. -If necessary will place on CIWA protocol but no need at this time NIDT2DM -Uncontrolled with hyperglycemia, HbA1c 7.4%. -Continue SSI, change to AC/HS if he tolerates liquids and is now on full's -CBGs ranging from 61-2 19  Lab Results  Component Value Date   HGBA1C 7.4 (H) 07/24/2021      Patient Active Problem List   Diagnosis Date Noted   Acute recurrent pancreatitis 12/20/2021   Diabetes mellitus (HCC)  05/28/2020   DKA (diabetic ketoacidosis) (HCC) 05/28/2020   Microcytic anemia 04/10/2017   SIRS (systemic inflammatory response syndrome) (HCC) 04/10/2017   Liver cirrhosis (HCC) 04/10/2017   GERD (gastroesophageal reflux disease) 04/10/2017   Cirrhosis of liver with ascites (HCC)    S/P laparoscopy 12/21/2016   Bleeding gastric varices    Hyponatremia 08/04/2016   Hypokalemia 08/04/2016   Abnormal CT scan, stomach    Upper GI bleed 08/02/2016   Hematemesis 08/02/2016   Pancreatic pseudocyst    Pulmonary nodule    Acute on chronic pancreatitis (HCC) 10/01/2014   AKI (acute kidney injury) (HCC) 10/01/2014   History of alcohol abuse 10/01/2014   Lactic acidosis 10/01/2014   Insomnia 07/05/2014    Past Medical History:  Diagnosis Date   Acute upper GI bleeding 08/02/2016   Anxiety    Blood transfusion without reported diagnosis    Cirrhosis of liver with ascites (HCC)    Diabetes mellitus without complication (HCC)    Hiatal hernia    History of alcohol abuse    Hypertension    Insomnia    Pancreatic pseudocyst    Pancreatitis    Portal hypertensive gastropathy (HCC)    PTSD (post-traumatic stress disorder)    Pulmonary nodule    Varices, gastric     Past Surgical History:  Procedure Laterality Date   ESOPHAGOGASTRODUODENOSCOPY N/A 12/18/2016   Procedure: ESOPHAGOGASTRODUODENOSCOPY (EGD);  Surgeon: Hilarie Fredrickson, MD;  Location: [facility] ENDOSCOPY;  Service: Endoscopy;  Laterality: N/A;   ESOPHAGOGASTRODUODENOSCOPY (EGD) WITH PROPOFOL N/A 08/03/2016   Procedure: ESOPHAGOGASTRODUODENOSCOPY (EGD) WITH PROPOFOL;  Surgeon: Rachael Fee, MD;  Location: [facility] ENDOSCOPY;  Service: Endoscopy;  Laterality: N/A;   EUS  08/03/2016   Procedure: UPPER ENDOSCOPIC ULTRASOUND (EUS) LINEAR;  Surgeon: Rachael Fee, MD;  Location: [facility] ENDOSCOPY;  Service: Endoscopy;;   FEMUR SURGERY Left    LAPAROSCOPY N/A 12/21/2016   Procedure: LAPAROSCOPY LYSIS OF ADHESIONS, EXPLORATORY LAPAROTOMY, SPLENECTOMY;   Surgeon: Avel Peace, MD;  Location: [facility] ORS;  Service: General;  Laterality: N/A;   SPLENECTOMY      Social History   Tobacco Use   Smoking status: Some Days    Packs/day: 0.50    Types: Cigarettes   Smokeless tobacco: Current    Types: Snuff  Vaping Use   Vaping Use: Never used  Substance Use Topics   Alcohol use: No   Drug use: No    Family History  Adopted: Yes  Problem Relation Age of Onset   Healthy Son        x1    No Known Allergies  Medication list has been reviewed and updated.  Current Outpatient Medications on File Prior to Visit  Medication Sig Dispense Refill   acetaminophen (TYLENOL) 325 MG tablet Take 2 tablets (650 mg total) by mouth every 6 (six) hours as needed for mild pain, fever or headache. 20 tablet 0   aspirin EC 81 MG tablet Take 81 mg by mouth daily.     feeding supplement (ENSURE ENLIVE / ENSURE PLUS) LIQD Take 237 mLs by mouth 2 (two) times daily between meals. 237 mL 12   glimepiride (AMARYL) 4 MG tablet TAKE 1 TABLET(4 MG) BY MOUTH DAILY WITH BREAKFAST (Patient taking differently: Take 4 mg by mouth daily with breakfast.) 90 tablet 3   HYDROcodone-acetaminophen (NORCO/VICODIN) 5-325 MG tablet Take 1 tablet by mouth every 6 (six) hours as needed. 12 tablet 0   losartan (COZAAR) 50 MG tablet TAKE 1 TABLET(50 MG) BY MOUTH DAILY (Patient taking differently: Take 50 mg by mouth daily.) 90 tablet 3   metFORMIN (GLUCOPHAGE-XR) 750 MG 24 hr tablet TAKE 1 TABLET(750 MG) BY MOUTH TWICE DAILY WITH A MEAL (Patient taking differently: Take 750 mg by mouth 2 (two) times daily with a meal.) 180 tablet 3   Multiple Vitamins-Minerals (MULTIVITAMIN ADULT) CHEW Chew 1 each by mouth daily.     ondansetron (ZOFRAN-ODT) 8 MG disintegrating tablet Take 1 tablet (8 mg total) by mouth every 8 (eight) hours as needed for nausea or vomiting. 12 tablet 0   pantoprazole (PROTONIX) 40 MG tablet Take 1 tablet (40 mg total) by mouth daily. 14 tablet 0   polyethylene  glycol (MIRALAX / GLYCOLAX) 17 g packet Take 17 g by mouth daily as needed for mild constipation. 14 each 0   No current facility-administered medications on file prior to visit.    Review of Systems:  As per HPI- otherwise negative.   Physical Examination: There were no vitals filed for this visit. There were no vitals filed for this visit. There is no height or weight on file to calculate BMI. Ideal Body Weight:    GEN: no acute distress. HEENT: Atraumatic, Normocephalic.  Ears and Nose: No external deformity. CV: RRR, No M/G/R. No JVD. No thrill. No extra heart sounds. PULM: CTA B, no wheezes, crackles, rhonchi. No retractions. No resp. distress. No accessory muscle use. ABD: S, NT, ND, +BS. No rebound. No HSM. EXTR:  No c/c/e PSYCH: Normally interactive. Conversant. '  Assessment and Plan: ***  Signed Abbe Amsterdam, MD

## 2022-01-21 NOTE — Patient Instructions (Incomplete)
Good to see you again today. I will be in touch with your labs  2nd dose of shingrix given today  Assuming all is well please see me in about 6 months and take care!

## 2022-01-22 ENCOUNTER — Ambulatory Visit (INDEPENDENT_AMBULATORY_CARE_PROVIDER_SITE_OTHER): Payer: BC Managed Care – PPO | Admitting: Family Medicine

## 2022-01-22 VITALS — BP 134/80 | HR 90 | Temp 97.9°F | Resp 18 | Ht 73.0 in | Wt 191.8 lb

## 2022-01-22 DIAGNOSIS — Z23 Encounter for immunization: Secondary | ICD-10-CM

## 2022-01-22 DIAGNOSIS — Z09 Encounter for follow-up examination after completed treatment for conditions other than malignant neoplasm: Secondary | ICD-10-CM

## 2022-01-22 DIAGNOSIS — E118 Type 2 diabetes mellitus with unspecified complications: Secondary | ICD-10-CM

## 2022-01-22 DIAGNOSIS — K859 Acute pancreatitis without necrosis or infection, unspecified: Secondary | ICD-10-CM

## 2022-01-23 LAB — HEMOGLOBIN A1C: Hgb A1c MFr Bld: 7.5 % — ABNORMAL HIGH (ref 4.6–6.5)

## 2022-01-23 LAB — COMPREHENSIVE METABOLIC PANEL
ALT: 11 U/L (ref 0–53)
AST: 15 U/L (ref 0–37)
Albumin: 4.4 g/dL (ref 3.5–5.2)
Alkaline Phosphatase: 45 U/L (ref 39–117)
BUN: 23 mg/dL (ref 6–23)
CO2: 29 mEq/L (ref 19–32)
Calcium: 9.4 mg/dL (ref 8.4–10.5)
Chloride: 101 mEq/L (ref 96–112)
Creatinine, Ser: 1.01 mg/dL (ref 0.40–1.50)
GFR: 83.51 mL/min (ref >60.00)
Glucose, Bld: 152 mg/dL — ABNORMAL HIGH (ref 70–99)
Potassium: 4.3 mEq/L (ref 3.5–5.1)
Sodium: 138 mEq/L (ref 135–145)
Total Bilirubin: 0.3 mg/dL (ref 0.2–1.2)
Total Protein: 7 g/dL (ref 6.0–8.3)

## 2022-01-23 LAB — CBC
HCT: 34.2 % — ABNORMAL LOW (ref 39.0–52.0)
Hemoglobin: 11.2 g/dL — ABNORMAL LOW (ref 13.0–17.0)
MCHC: 32.8 g/dL (ref 30.0–36.0)
MCV: 101.9 fl — ABNORMAL HIGH (ref 78.0–100.0)
Platelets: 383 10*3/uL (ref 150.0–400.0)
RBC: 3.35 Mil/uL — ABNORMAL LOW (ref 4.22–5.81)
RDW: 13.3 % (ref 11.5–15.5)
WBC: 6.5 10*3/uL (ref 4.0–10.5)

## 2022-01-23 LAB — MAGNESIUM: Magnesium: 1.6 mg/dL (ref 1.5–2.5)

## 2022-01-23 LAB — PHOSPHORUS: Phosphorus: 2.7 mg/dL (ref 2.3–4.6)

## 2022-04-10 ENCOUNTER — Other Ambulatory Visit: Payer: Self-pay | Admitting: Family Medicine

## 2022-04-10 DIAGNOSIS — E118 Type 2 diabetes mellitus with unspecified complications: Secondary | ICD-10-CM

## 2022-04-11 ENCOUNTER — Telehealth: Payer: Self-pay

## 2022-04-11 DIAGNOSIS — E118 Type 2 diabetes mellitus with unspecified complications: Secondary | ICD-10-CM

## 2022-04-11 MED ORDER — METFORMIN HCL ER (MOD) 1000 MG PO TB24: 1000.0000 mg | ORAL_TABLET | Freq: Two times a day (BID) | ORAL | 3 refills | Status: DC

## 2022-04-11 NOTE — Telephone Encounter (Signed)
Lab Results  Component Value Date   HGBA1C 7.5 (H) 01/22/2022   Gave him a call.  He is tolerating extended release metformin 750 twice daily fine, would like to try going up.  We would like to get his A1c closer to 7% if we can Most recent kidney function in July was normal We will change him to 1000 mg twice daily, plan to recheck A1c in 3 to 4 months.  Patient states understanding and agreement Meds ordered this encounter  Medications   metFORMIN (GLUMETZA) 1000 MG (MOD) 24 hr tablet    Sig: Take 1 tablet (1,000 mg total) by mouth 2 (two) times daily with a meal.    Dispense:  180 tablet    Refill:  3

## 2022-04-11 NOTE — Telephone Encounter (Signed)
Caller Name Healy Phone Number [phone] Patient Name Samuel Huffman Patient DOB [birth year] Call Type Message Only Information Provided Reason for Call Request for General Office Information Initial Comment Caller states he wants his metformin dosage increased. Disp. Time Disposition Final User 04/10/2022 5:27:45 PM General Information Provided Yes Ronnald Ramp, [place] Call Closed By: Eliane Decree Transaction Date/Time: 04/10/2022 5:24:54 PM (ET)

## 2022-04-11 NOTE — Telephone Encounter (Signed)
Okay for dose increase?  

## 2022-04-13 ENCOUNTER — Telehealth: Payer: Self-pay

## 2022-04-13 NOTE — Telephone Encounter (Signed)
PA: Approved today CaseId:[id number];Status:Approved;Review Type:Prior Auth;Coverage Start Date:03/14/2022;Coverage End Date:04/13/2023;

## 2022-06-05 ENCOUNTER — Telehealth: Payer: Self-pay | Admitting: Family Medicine

## 2022-06-05 MED ORDER — DOXYCYCLINE HYCLATE 100 MG PO CAPS: 100.0000 mg | ORAL_CAPSULE | Freq: Two times a day (BID) | ORAL | 0 refills | Status: DC

## 2022-06-05 NOTE — Telephone Encounter (Signed)
Patient states he has a head cold that has moved to his chest, and he is now coughing up a lot of mucus. 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 Patsy Lager sends him antibiotics. Patient declined an OV. Please advise.

## 2022-06-05 NOTE — Telephone Encounter (Signed)
Please see below.

## 2022-06-05 NOTE — Telephone Encounter (Signed)
Called pt around 2pm- tried both numbers a few times but phone was not ringing or functioning properly, could not LMOM I did go ahead and call in doxycycline Called back at 6 PM and left message on machine  Would like a few more details about his current illness such as how long has he been sick, any fever, has been tested for COVID.  Advised that I did call in the antibiotic  Please let me know if there is anything else I can do

## 2022-07-24 NOTE — Patient Instructions (Incomplete)
It was great to see you again today, I will be in touch with your labs soon as possible Recommend COVID booster if not done in the last 6 months or so Referral to ophthalmology to check on your eye asap I ordered your CT coronary calcium test to be done at your convenience   Flu shot given today

## 2022-07-24 NOTE — Progress Notes (Unsigned)
[place] at [facility] [address], [place], [place] [address] [phone]  Date:  07/25/2022   Name:  Samuel Huffman   DOB:  October 03, 1965   MRN:  [id number]  PCP:  Darreld Mclean, MD    Chief Complaint: No chief complaint on file.   History of Present Illness:  Samuel Huffman is a 57 y.o. very pleasant male patient who presents with the following:  Patient seen today for 55-month follow-up visit History of diabetes with history of DKA, recurrent pancreatitis, cirrhosis, GERD, splenectomy, hypertension  Most recently seen by myself in July-at that time he had recently been admitted for 3 days with pancreatitis.  Fortunately he had recovered and was feeling back to normal  Needs urine micro Flu shot Recommend COVID booster Eye exam Foot exam can be updated A1c can be updated   Lab Results  Component Value Date   HGBA1C 7.5 (H) 01/22/2022   Amaryl 4 Metformin ER 1000 twice daily Losartan 50  He does not appear to be on a statin, this should be added.  Can also offer CT coronary calcium  Patient Active Problem List   Diagnosis Date Noted   Acute recurrent pancreatitis 12/20/2021   Diabetes mellitus ([place]) 05/28/2020   DKA (diabetic ketoacidosis) ([place]) 05/28/2020   Microcytic anemia 04/10/2017   SIRS (systemic inflammatory response syndrome) ([place]) 04/10/2017   Liver cirrhosis ([place]) 04/10/2017   GERD (gastroesophageal reflux disease) 04/10/2017   Cirrhosis of liver with ascites ([place])    S/P laparoscopy 12/21/2016   Bleeding gastric varices    Hyponatremia 08/04/2016   Hypokalemia 08/04/2016   Abnormal CT scan, stomach    Upper GI bleed 08/02/2016   Hematemesis 08/02/2016   Pancreatic pseudocyst    Pulmonary nodule    Acute on chronic pancreatitis ([place]) 10/01/2014   AKI (acute kidney injury) ([place]) 10/01/2014   History of alcohol abuse 10/01/2014   Lactic acidosis 10/01/2014   Insomnia 07/05/2014    Past  Medical History:  Diagnosis Date   Acute upper GI bleeding 08/02/2016   Anxiety    Blood transfusion without reported diagnosis    Cirrhosis of liver with ascites ([place])    Diabetes mellitus without complication (HCC)    Hiatal hernia    History of alcohol abuse    Hypertension    Insomnia    Pancreatic pseudocyst    Pancreatitis    Portal hypertensive gastropathy (HCC)    PTSD (post-traumatic stress disorder)    Pulmonary nodule    Varices, gastric     Past Surgical History:  Procedure Laterality Date   ESOPHAGOGASTRODUODENOSCOPY N/A 12/18/2016   Procedure: ESOPHAGOGASTRODUODENOSCOPY (EGD);  Surgeon: Irene Shipper, MD;  Location: [facility] ENDOSCOPY;  Service: Endoscopy;  Laterality: N/A;   ESOPHAGOGASTRODUODENOSCOPY (EGD) WITH PROPOFOL N/A 08/03/2016   Procedure: ESOPHAGOGASTRODUODENOSCOPY (EGD) WITH PROPOFOL;  Surgeon: Milus Banister, MD;  Location: [place];  Service: Endoscopy;  Laterality: N/A;   EUS  08/03/2016   Procedure: UPPER ENDOSCOPIC ULTRASOUND (EUS) LINEAR;  Surgeon: Milus Banister, MD;  Location: [place];  Service: Endoscopy;;   FEMUR SURGERY Left    LAPAROSCOPY N/A 12/21/2016   Procedure: LAPAROSCOPY LYSIS OF ADHESIONS, EXPLORATORY LAPAROTOMY, SPLENECTOMY;  Surgeon: Jackolyn Confer, MD;  Location: [facility]S;  Service: General;  Laterality: N/A;   SPLENECTOMY      Social History   Tobacco Use   Smoking status: Some Days    Packs/day: 0.50  Types: Cigarettes   Smokeless tobacco: Current    Types: Snuff  Vaping Use   Vaping Use: Never used  Substance Use Topics   Alcohol use: No   Drug use: No    Family History  Adopted: Yes  Problem Relation Age of Onset   Healthy Son        x1    No Known Allergies  Medication list has been reviewed and updated.  Current Outpatient Medications on File Prior to Visit  Medication Sig Dispense Refill   acetaminophen (TYLENOL) 325 MG tablet Take 2 tablets (650 mg total) by mouth every 6 (six) hours as needed for  mild pain, fever or headache. 20 tablet 0   aspirin EC 81 MG tablet Take 81 mg by mouth daily.     doxycycline (VIBRAMYCIN) 100 MG capsule Take 1 capsule (100 mg total) by mouth 2 (two) times daily. 20 capsule 0   feeding supplement (ENSURE ENLIVE / ENSURE PLUS) LIQD Take 237 mLs by mouth 2 (two) times daily between meals. 237 mL 12   glimepiride (AMARYL) 4 MG tablet Take 1 tablet (4 mg total) by mouth daily with breakfast. 90 tablet 3   HYDROcodone-acetaminophen (NORCO/VICODIN) 5-325 MG tablet Take 1 tablet by mouth every 6 (six) hours as needed. 12 tablet 0   losartan (COZAAR) 50 MG tablet Take 1 tablet (50 mg total) by mouth daily. 90 tablet 3   metFORMIN (GLUMETZA) 1000 MG (MOD) 24 hr tablet Take 1 tablet (1,000 mg total) by mouth 2 (two) times daily with a meal. 180 tablet 3   Multiple Vitamins-Minerals (MULTIVITAMIN ADULT) CHEW Chew 1 each by mouth daily.     ondansetron (ZOFRAN-ODT) 8 MG disintegrating tablet Take 1 tablet (8 mg total) by mouth every 8 (eight) hours as needed for nausea or vomiting. 12 tablet 0   pantoprazole (PROTONIX) 40 MG tablet Take 1 tablet (40 mg total) by mouth daily. 14 tablet 0   polyethylene glycol (MIRALAX / GLYCOLAX) 17 g packet Take 17 g by mouth daily as needed for mild constipation. 14 each 0   No current facility-administered medications on file prior to visit.    Review of Systems:  As per HPI- otherwise negative.   Physical Examination: There were no vitals filed for this visit. There were no vitals filed for this visit. There is no height or weight on file to calculate BMI. Ideal Body Weight:    GEN: no acute distress. HEENT: Atraumatic, Normocephalic.  Ears and Nose: No external deformity. CV: RRR, No M/G/R. No JVD. No thrill. No extra heart sounds. PULM: CTA B, no wheezes, crackles, rhonchi. No retractions. No resp. distress. No accessory muscle use. ABD: S, NT, ND, +BS. No rebound. No HSM. EXTR: No c/c/e PSYCH: Normally interactive.  Conversant.  Foot exam  Assessment and Plan: ***  Signed Lamar Blinks, MD

## 2022-07-25 ENCOUNTER — Ambulatory Visit: Payer: BC Managed Care – PPO | Admitting: Family Medicine

## 2022-07-25 ENCOUNTER — Encounter: Payer: Self-pay | Admitting: Family Medicine

## 2022-07-25 VITALS — BP 142/86 | HR 87 | Temp 98.6°F | Resp 18 | Ht 73.0 in | Wt 185.0 lb

## 2022-07-25 DIAGNOSIS — Z125 Encounter for screening for malignant neoplasm of prostate: Secondary | ICD-10-CM

## 2022-07-25 DIAGNOSIS — Z130 Encounter for screening for diseases of the blood and blood-forming organs and certain disorders involving the immune mechanism: Secondary | ICD-10-CM | POA: Diagnosis not present

## 2022-07-25 DIAGNOSIS — Z13220 Encounter for screening for lipoid disorders: Secondary | ICD-10-CM | POA: Diagnosis not present

## 2022-07-25 DIAGNOSIS — R972 Elevated prostate specific antigen [PSA]: Secondary | ICD-10-CM

## 2022-07-25 DIAGNOSIS — Z23 Encounter for immunization: Secondary | ICD-10-CM | POA: Diagnosis not present

## 2022-07-25 DIAGNOSIS — Z9081 Acquired absence of spleen: Secondary | ICD-10-CM

## 2022-07-25 DIAGNOSIS — H539 Unspecified visual disturbance: Secondary | ICD-10-CM

## 2022-07-25 DIAGNOSIS — E118 Type 2 diabetes mellitus with unspecified complications: Secondary | ICD-10-CM | POA: Diagnosis not present

## 2022-07-25 DIAGNOSIS — D649 Anemia, unspecified: Secondary | ICD-10-CM

## 2022-07-25 MED ORDER — AMOXICILLIN-POT CLAVULANATE 875-125 MG PO TABS
1.0000 | ORAL_TABLET | Freq: Two times a day (BID) | ORAL | 0 refills | Status: DC
Start: 2022-07-25 — End: 2023-01-23

## 2022-07-26 ENCOUNTER — Other Ambulatory Visit: Payer: Self-pay | Admitting: *Deleted

## 2022-07-26 ENCOUNTER — Other Ambulatory Visit (INDEPENDENT_AMBULATORY_CARE_PROVIDER_SITE_OTHER): Payer: BC Managed Care – PPO

## 2022-07-26 DIAGNOSIS — D539 Nutritional anemia, unspecified: Secondary | ICD-10-CM

## 2022-07-26 LAB — COMPREHENSIVE METABOLIC PANEL
ALT: 16 U/L (ref 0–53)
AST: 24 U/L (ref 0–37)
Albumin: 4.5 g/dL (ref 3.5–5.2)
Alkaline Phosphatase: 54 U/L (ref 39–117)
BUN: 14 mg/dL (ref 6–23)
CO2: 31 mEq/L (ref 19–32)
Calcium: 9.8 mg/dL (ref 8.4–10.5)
Chloride: 100 mEq/L (ref 96–112)
Creatinine, Ser: 0.83 mg/dL (ref 0.40–1.50)
GFR: 97.93 mL/min (ref >60.00)
Glucose, Bld: 127 mg/dL — ABNORMAL HIGH (ref 70–99)
Potassium: 4.8 mEq/L (ref 3.5–5.1)
Sodium: 138 mEq/L (ref 135–145)
Total Bilirubin: 0.3 mg/dL (ref 0.2–1.2)
Total Protein: 7 g/dL (ref 6.0–8.3)

## 2022-07-26 LAB — LIPID PANEL
Cholesterol: 212 mg/dL — ABNORMAL HIGH (ref 0–200)
HDL: 104 mg/dL (ref >39.00)
NonHDL: 108.49
Total CHOL/HDL Ratio: 2
Triglycerides: 202 mg/dL — ABNORMAL HIGH (ref 0.0–149.0)
VLDL: 40.4 mg/dL — ABNORMAL HIGH (ref 0.0–40.0)

## 2022-07-26 LAB — HEMOGLOBIN A1C: Hgb A1c MFr Bld: 6.6 % — ABNORMAL HIGH (ref 4.6–6.5)

## 2022-07-26 LAB — CBC
HCT: 33.3 % — ABNORMAL LOW (ref 39.0–52.0)
Hemoglobin: 11.2 g/dL — ABNORMAL LOW (ref 13.0–17.0)
MCHC: 33.7 g/dL (ref 30.0–36.0)
MCV: 102.7 fl — ABNORMAL HIGH (ref 78.0–100.0)
Platelets: 296 10*3/uL (ref 150.0–400.0)
RBC: 3.24 Mil/uL — ABNORMAL LOW (ref 4.22–5.81)
RDW: 14 % (ref 11.5–15.5)
WBC: 5.8 10*3/uL (ref 4.0–10.5)

## 2022-07-26 LAB — MICROALBUMIN / CREATININE URINE RATIO
Creatinine,U: 87.1 mg/dL
Microalb Creat Ratio: 2.1 mg/g (ref 0.0–30.0)
Microalb, Ur: 1.8 mg/dL (ref 0.0–1.9)

## 2022-07-26 LAB — LDL CHOLESTEROL, DIRECT: Direct LDL: 71 mg/dL

## 2022-07-26 LAB — PSA: PSA: 9.57 ng/mL — ABNORMAL HIGH (ref 0.10–4.00)

## 2022-07-26 LAB — B12 AND FOLATE PANEL
Folate: >23.8 ng/mL (ref >5.9)
Vitamin B-12: 1466 pg/mL — ABNORMAL HIGH (ref 211–911)

## 2022-07-26 MED ORDER — METFORMIN HCL ER 750 MG PO TB24: 750.0000 mg | ORAL_TABLET | Freq: Two times a day (BID) | ORAL | 3 refills | Status: DC

## 2022-07-26 NOTE — Addendum Note (Signed)
Addended by: Lamar Blinks C on: 07/26/2022 01:59 PM   Modules accepted: Orders

## 2022-07-31 ENCOUNTER — Encounter: Payer: Self-pay | Admitting: Family Medicine

## 2022-08-16 ENCOUNTER — Other Ambulatory Visit: Payer: Self-pay | Admitting: Family Medicine

## 2022-09-11 ENCOUNTER — Emergency Department (HOSPITAL_BASED_OUTPATIENT_CLINIC_OR_DEPARTMENT_OTHER): Payer: BC Managed Care – PPO

## 2022-09-11 ENCOUNTER — Emergency Department (HOSPITAL_BASED_OUTPATIENT_CLINIC_OR_DEPARTMENT_OTHER)
Admission: EM | Admit: 2022-09-11 | Discharge: 2022-09-12 | Disposition: A | Discharge status: Home / Self Care | Payer: BC Managed Care – PPO | Attending: Emergency Medicine | Admitting: Emergency Medicine

## 2022-09-11 ENCOUNTER — Other Ambulatory Visit: Payer: Self-pay

## 2022-09-11 DIAGNOSIS — Z79899 Other long term (current) drug therapy: Secondary | ICD-10-CM | POA: Diagnosis not present

## 2022-09-11 DIAGNOSIS — K2980 Duodenitis without bleeding: Secondary | ICD-10-CM | POA: Insufficient documentation

## 2022-09-11 DIAGNOSIS — Z7984 Long term (current) use of oral hypoglycemic drugs: Secondary | ICD-10-CM | POA: Insufficient documentation

## 2022-09-11 DIAGNOSIS — I10 Essential (primary) hypertension: Secondary | ICD-10-CM | POA: Diagnosis not present

## 2022-09-11 DIAGNOSIS — E119 Type 2 diabetes mellitus without complications: Secondary | ICD-10-CM | POA: Diagnosis not present

## 2022-09-11 DIAGNOSIS — K2900 Acute gastritis without bleeding: Secondary | ICD-10-CM

## 2022-09-11 DIAGNOSIS — R109 Unspecified abdominal pain: Secondary | ICD-10-CM | POA: Diagnosis not present

## 2022-09-11 DIAGNOSIS — K760 Fatty (change of) liver, not elsewhere classified: Secondary | ICD-10-CM | POA: Diagnosis not present

## 2022-09-11 LAB — CBC WITH DIFFERENTIAL/PLATELET
Abs Immature Granulocytes: 0.05 10*3/uL (ref 0.00–0.07)
Basophils Absolute: 0.1 10*3/uL (ref 0.0–0.1)
Basophils Relative: 1 %
Eosinophils Absolute: 0.2 10*3/uL (ref 0.0–0.5)
Eosinophils Relative: 2 %
HCT: 38.9 % — ABNORMAL LOW (ref 39.0–52.0)
Hemoglobin: 13.4 g/dL (ref 13.0–17.0)
Immature Granulocytes: 1 %
Lymphocytes Relative: 13 %
Lymphs Abs: 1.2 10*3/uL (ref 0.7–4.0)
MCH: 34.1 pg — ABNORMAL HIGH (ref 26.0–34.0)
MCHC: 34.4 g/dL (ref 30.0–36.0)
MCV: 99 fL (ref 80.0–100.0)
Monocytes Absolute: 1 10*3/uL (ref 0.1–1.0)
Monocytes Relative: 11 %
Neutro Abs: 7 10*3/uL (ref 1.7–7.7)
Neutrophils Relative %: 72 %
Platelets: 346 10*3/uL (ref 150–400)
RBC: 3.93 MIL/uL — ABNORMAL LOW (ref 4.22–5.81)
RDW: 12.2 % (ref 11.5–15.5)
WBC: 9.4 10*3/uL (ref 4.0–10.5)
nRBC: 0 % (ref 0.0–0.2)

## 2022-09-11 LAB — COMPREHENSIVE METABOLIC PANEL
ALT: 18 U/L (ref 0–44)
AST: 26 U/L (ref 15–41)
Albumin: 4.6 g/dL (ref 3.5–5.0)
Alkaline Phosphatase: 50 U/L (ref 38–126)
Anion gap: 7 (ref 5–15)
BUN: 20 mg/dL (ref 6–20)
CO2: 29 mmol/L (ref 22–32)
Calcium: 12.3 mg/dL — ABNORMAL HIGH (ref 8.9–10.3)
Chloride: 94 mmol/L — ABNORMAL LOW (ref 98–111)
Creatinine, Ser: 1.02 mg/dL (ref 0.61–1.24)
GFR, Estimated: >60 mL/min (ref >60)
Glucose, Bld: 199 mg/dL — ABNORMAL HIGH (ref 70–99)
Potassium: 4.2 mmol/L (ref 3.5–5.1)
Sodium: 130 mmol/L — ABNORMAL LOW (ref 135–145)
Total Bilirubin: 0.7 mg/dL (ref 0.3–1.2)
Total Protein: 8.3 g/dL — ABNORMAL HIGH (ref 6.5–8.1)

## 2022-09-11 LAB — LIPASE, BLOOD: Lipase: 49 U/L (ref 11–51)

## 2022-09-11 MED ORDER — IOHEXOL 300 MG/ML  SOLN
100.0000 mL | Freq: Once | INTRAMUSCULAR | Status: AC | PRN
  Administered 2022-09-11: 100 mL via INTRAVENOUS

## 2022-09-11 MED ORDER — ALUM & MAG HYDROXIDE-SIMETH 200-200-20 MG/5ML PO SUSP
30.0000 mL | Freq: Once | ORAL | Status: AC
Start: 2022-09-11 — End: 2022-09-11
  Administered 2022-09-11: 30 mL via ORAL
  Filled 2022-09-11: qty 30

## 2022-09-11 MED ORDER — ONDANSETRON HCL 4 MG/2ML IJ SOLN
4.0000 mg | Freq: Once | INTRAMUSCULAR | Status: AC
Start: 2022-09-11 — End: 2022-09-11
  Administered 2022-09-11: 4 mg via INTRAVENOUS
  Filled 2022-09-11: qty 2

## 2022-09-11 MED ORDER — PANTOPRAZOLE SODIUM 40 MG PO TBEC: 40.0000 mg | DELAYED_RELEASE_TABLET | Freq: Two times a day (BID) | ORAL | 0 refills | Status: DC

## 2022-09-11 MED ORDER — LACTATED RINGERS IV BOLUS
1000.0000 mL | Freq: Once | INTRAVENOUS | Status: AC
Start: 2022-09-11 — End: 2022-09-11
  Administered 2022-09-11: 1000 mL via INTRAVENOUS

## 2022-09-11 MED ORDER — HYDROMORPHONE HCL 1 MG/ML IJ SOLN
0.5000 mg | Freq: Once | INTRAMUSCULAR | Status: AC
  Administered 2022-09-11: 0.5 mg via INTRAVENOUS
  Filled 2022-09-11: qty 1

## 2022-09-11 MED ORDER — MORPHINE SULFATE (PF) 4 MG/ML IV SOLN
4.0000 mg | Freq: Once | INTRAVENOUS | Status: AC
  Administered 2022-09-11: 4 mg via INTRAVENOUS
  Filled 2022-09-11: qty 1

## 2022-09-11 MED ORDER — PANTOPRAZOLE SODIUM 40 MG IV SOLR
40.0000 mg | Freq: Once | INTRAVENOUS | Status: AC
  Administered 2022-09-11: 40 mg via INTRAVENOUS
  Filled 2022-09-11: qty 10

## 2022-09-11 MED ORDER — MAALOX 600 MG PO CHEW
600.0000 mg | CHEWABLE_TABLET | Freq: Three times a day (TID) | ORAL | 0 refills | Status: AC | PRN
Start: 2022-09-11 — End: 2022-10-11

## 2022-09-11 MED ORDER — HYDROCODONE-ACETAMINOPHEN 5-325 MG PO TABS: 1.0000 | ORAL_TABLET | Freq: Four times a day (QID) | ORAL | 0 refills | Status: DC | PRN

## 2022-09-11 MED ORDER — ONDANSETRON 4 MG PO TBDP: 4.0000 mg | ORAL_TABLET | Freq: Three times a day (TID) | ORAL | 0 refills | Status: DC | PRN

## 2022-09-11 NOTE — ED Triage Notes (Signed)
Pt reports generalized abdominal pain since Sunday. Pt reports hx of pancreatitis and has been vomiting as well.

## 2022-09-11 NOTE — Discharge Instructions (Addendum)
Your workup today showed gastritis and duodenitis.  No evidence of pancreatitis.  I recommend increasing your Protonix to twice daily.  Take Maalox as needed.  Have sent some pain medication that she can keep on hand for severe breakthrough pain.  For any concerning symptoms return to the emergency department otherwise follow-up with your primary care provider.

## 2022-09-11 NOTE — ED Provider Notes (Signed)
[facility] Provider Note   CSN: [id number] Arrival date & time: 09/11/22  2047     History  Chief Complaint  Patient presents with   Emesis   Abdominal Pain    Samuel Huffman is a 57 y.o. male.  57 year old male presents today for pancreatitis presents today for evaluation of severe abdominal pain that has been ongoing since Sunday.  He states is progressively worsening.  He has not been able to keep his symptoms under control.  States last p.o. intake was Saturday.  Denies alcohol use.  Denies diarrhea, constipation, hematemesis.  No blood in stool.  The history is provided by the patient. No language interpreter was used.       Home Medications Prior to Admission medications   Medication Sig Start Date End Date Taking? Authorizing Provider  acetaminophen (TYLENOL) 325 MG tablet Take 2 tablets (650 mg total) by mouth every 6 (six) hours as needed for mild pain, fever or headache. 12/23/21   Raiford Noble Latif, DO  amoxicillin-clavulanate (AUGMENTIN) 875-125 MG tablet Take 1 tablet by mouth 2 (two) times daily. 07/25/22   Copland, Gay Filler, MD  aspirin EC 81 MG tablet Take 81 mg by mouth daily.    [provider]  feeding supplement (ENSURE ENLIVE / ENSURE PLUS) LIQD Take 237 mLs by mouth 2 (two) times daily between meals. 12/23/21   Raiford Noble Latif, DO  glimepiride (AMARYL) 4 MG tablet Take 1 tablet (4 mg total) by mouth daily with breakfast. 04/10/22   Copland, Gay Filler, MD  HYDROcodone-acetaminophen (NORCO/VICODIN) 5-325 MG tablet Take 1 tablet by mouth every 6 (six) hours as needed. 12/18/21   Drenda Freeze, MD  losartan (COZAAR) 50 MG tablet Take 1 tablet (50 mg total) by mouth daily. 04/10/22   Copland, Gay Filler, MD  metFORMIN (GLUCOPHAGE-XR) 750 MG 24 hr tablet Take 1 tablet (750 mg total) by mouth 2 (two) times daily with a meal. 07/26/22   Copland, Gay Filler, MD  Multiple Vitamins-Minerals (MULTIVITAMIN ADULT)  CHEW Chew 1 each by mouth daily.    [provider]  ondansetron (ZOFRAN-ODT) 8 MG disintegrating tablet Take 1 tablet (8 mg total) by mouth every 8 (eight) hours as needed for nausea or vomiting. 12/18/21   Drenda Freeze, MD  pantoprazole (PROTONIX) 40 MG tablet TAKE 1 TABLET(40 MG) BY MOUTH DAILY 08/16/22   Copland, Gay Filler, MD  polyethylene glycol (MIRALAX / GLYCOLAX) 17 g packet Take 17 g by mouth daily as needed for mild constipation. 12/23/21   Kerney Elbe, DO      Allergies    Patient has no known allergies.    Review of Systems   Review of Systems  Constitutional:  Negative for chills and fever.  Respiratory:  Negative for shortness of breath.   Gastrointestinal:  Positive for abdominal pain, nausea and vomiting.  Genitourinary:  Negative for dysuria and flank pain.  Neurological:  Negative for light-headedness.  All other systems reviewed and are negative.   Physical Exam Updated Vital Signs BP (!) 158/114 (BP Location: Left Arm)   Pulse (!) 109   Temp 98.8 F (37.1 C)   Resp 20   Ht 6\' 2"  (1.88 m)   Wt 81.6 kg   SpO2 99%   BMI 23.11 kg/m  Physical Exam Vitals and nursing note reviewed.  Constitutional:      General: He is not in acute distress.    Appearance: Normal appearance. He  is not ill-appearing.  HENT:     Head: Normocephalic and atraumatic.     Nose: Nose normal.  Eyes:     General: No scleral icterus.    Extraocular Movements: Extraocular movements intact.     Conjunctiva/sclera: Conjunctivae normal.  Cardiovascular:     Rate and Rhythm: Normal rate and regular rhythm.     Pulses: Normal pulses.  Pulmonary:     Effort: Pulmonary effort is normal. No respiratory distress.     Breath sounds: Normal breath sounds. No wheezing or rales.  Abdominal:     General: There is no distension.     Palpations: Abdomen is soft.     Tenderness: There is abdominal tenderness. There is no guarding or rebound.  Musculoskeletal:        General:  Normal range of motion.     Cervical back: Normal range of motion.  Skin:    General: Skin is warm and dry.  Neurological:     General: No focal deficit present.     Mental Status: He is alert. Mental status is at baseline.     ED Results / Procedures / Treatments   Labs (all labs ordered are listed, but only abnormal results are displayed) Labs Reviewed  CBC WITH DIFFERENTIAL/PLATELET - Abnormal; Notable for the following components:      Result Value   RBC 3.93 (*)    HCT 38.9 (*)    MCH 34.1 (*)    All other components within normal limits  COMPREHENSIVE METABOLIC PANEL - Abnormal; Notable for the following components:   Sodium 130 (*)    Chloride 94 (*)    Glucose, Bld 199 (*)    Calcium 12.3 (*)    Total Protein 8.3 (*)    All other components within normal limits  LIPASE, BLOOD  URINALYSIS, ROUTINE W REFLEX MICROSCOPIC    EKG None  Radiology No results found.  Procedures Procedures    Medications Ordered in ED Medications  morphine (PF) 4 MG/ML injection 4 mg (4 mg Intravenous Given 09/11/22 2154)  lactated ringers bolus 1,000 mL (1,000 mLs Intravenous New Bag/Given 09/11/22 2159)  ondansetron (ZOFRAN) injection 4 mg (4 mg Intravenous Given 09/11/22 2155)  iohexol (OMNIPAQUE) 300 MG/ML solution 100 mL (100 mLs Intravenous Contrast Given 09/11/22 2254)    ED Course/ Medical Decision Making/ A&P                             Medical Decision Making Amount and/or Complexity of Data Reviewed Labs: ordered. Radiology: ordered.  Risk OTC drugs. Prescription drug management.   Medical Decision Making / ED Course   This patient presents to the ED for concern of abdominal pain, this involves an extensive number of treatment options, and is a complaint that carries with it a high risk of complications and morbidity.  The differential diagnosis includes pancreatitis, gastroenteritis, gastritis, diverticulitis, appendicitis, cholecystitis  MDM: 57 year old  male presents today for evaluation of abdominal pain.  Has history of pancreatitis.  Previously admitted in June.  States he gets a flareup every 6 months or so.  Feels similar to prior episodes.  Ongoing since Sunday.  He reports compliance with home medications.  Is overall well-appearing.  Without acute distress.  Will provide medications, fluids, and obtain CT imaging and labs.  CBC is without leukocytosis or anemia.  CMP shows preserved renal function, electrolytes without acute derangements.  Glucose elevated at 199.  Sodium 130.  When corrected for hyperglycemia dips 132.  Lipase within normal limit.  CT abdomen pelvis does not show evidence of acute pancreatitis but does show evidence of gastritis and duodenitis.  No evidence of hemodynamic instability.  No evidence of GI bleed.  Will increase patient's Protonix to twice daily, provide Maalox.  Will give pain medication to keep on hand for severe breakthrough pain.  Discussion had with patient.  He feels comfortable with discharge and outpatient follow-up.  He will return for any worsening symptoms.  Patient is appropriate for discharge.  Discharged in stable condition.  Return precaution discussed.   Additional history obtained: -Additional history obtained from previous ED visits and admission -External records from outside source obtained and reviewed including: Chart review including previous notes, labs, imaging, consultation notes   Lab Tests: -I ordered, reviewed, and interpreted labs.   The pertinent results include:   Labs Reviewed  CBC WITH DIFFERENTIAL/PLATELET - Abnormal; Notable for the following components:      Result Value   RBC 3.93 (*)    HCT 38.9 (*)    MCH 34.1 (*)    All other components within normal limits  COMPREHENSIVE METABOLIC PANEL - Abnormal; Notable for the following components:   Sodium 130 (*)    Chloride 94 (*)    Glucose, Bld 199 (*)    Calcium 12.3 (*)    Total Protein 8.3 (*)    All other  components within normal limits  LIPASE, BLOOD  URINALYSIS, ROUTINE W REFLEX MICROSCOPIC      EKG  EKG Interpretation  Date/Time:    Ventricular Rate:    PR Interval:    QRS Duration:   QT Interval:    QTC Calculation:   R Axis:     Text Interpretation:           Imaging Studies ordered: I ordered imaging studies including CT abdomen pelvis with contrast I independently visualized and interpreted imaging. I agree with the radiologist interpretation   Medicines ordered and prescription drug management: Meds ordered this encounter  Medications   morphine (PF) 4 MG/ML injection 4 mg   lactated ringers bolus 1,000 mL   ondansetron (ZOFRAN) injection 4 mg   iohexol (OMNIPAQUE) 300 MG/ML solution 100 mL   pantoprazole (PROTONIX) injection 40 mg   HYDROmorphone (DILAUDID) injection 0.5 mg   alum & mag hydroxide-simeth (MAALOX/MYLANTA) 200-200-20 MG/5ML suspension 30 mL   pantoprazole (PROTONIX) 40 MG tablet    Sig: Take 1 tablet (40 mg total) by mouth 2 (two) times daily.    Dispense:  60 tablet    Refill:  0    Order Specific Question:   Supervising Provider    Answer:   Sabra Heck, BRIAN [[id number]]   HYDROcodone-acetaminophen (NORCO/VICODIN) 5-325 MG tablet    Sig: Take 1-2 tablets by mouth every 6 (six) hours as needed for severe pain.    Dispense:  10 tablet    Refill:  0    Order Specific Question:   Supervising Provider    Answer:   Sabra Heck, BRIAN [[id number]]   Calcium Carbonate Antacid (MAALOX) 600 MG chewable tablet    Sig: Chew 1 tablet (600 mg total) by mouth 3 (three) times daily as needed for heartburn.    Dispense:  90 tablet    Refill:  0    Order Specific Question:   Supervising Provider    Answer:   MILLER, BRIAN [[id number]]   ondansetron (ZOFRAN-ODT) 4 MG disintegrating tablet    Sig: Take 1 tablet (  4 mg total) by mouth every 8 (eight) hours as needed.    Dispense:  20 tablet    Refill:  0    Order Specific Question:   Supervising Provider    Answer:   MILLER,  BRIAN [[id number]]    -I have reviewed the patients home medicines and have made adjustments as needed  Critical interventions Fluids, pain medication  Cardiac Monitoring: The patient was maintained on a cardiac monitor.  I personally viewed and interpreted the cardiac monitored which showed an underlying rhythm of: Initially sinus tachycardia improved to normal sinus rhythm  Reevaluation: After the interventions noted above, I reevaluated the patient and found that they have :improved  Co morbidities that complicate the patient evaluation  Past Medical History:  Diagnosis Date   Acute upper GI bleeding 08/02/2016   Anxiety    Blood transfusion without reported diagnosis    Cirrhosis of liver with ascites ([place])    Diabetes mellitus without complication ([place])    Hiatal hernia    History of alcohol abuse    Hypertension    Insomnia    Pancreatic pseudocyst    Pancreatitis    Portal hypertensive gastropathy (HCC)    PTSD (post-traumatic stress disorder)    Pulmonary nodule    Varices, gastric       Dispostion: Patient is appropriate for discharge.  Discharged in stable condition.  Return precaution discussed.  Patient voices understanding and is in agreement with plan.  Final Clinical Impression(s) / ED Diagnoses Final diagnoses:  Acute gastritis without hemorrhage, unspecified gastritis type  Duodenitis    Rx / DC Orders ED Discharge Orders          Ordered    pantoprazole (PROTONIX) 40 MG tablet  2 times daily        09/11/22 2337    HYDROcodone-acetaminophen (NORCO/VICODIN) 5-325 MG tablet  Every 6 hours PRN        09/11/22 2337    Calcium Carbonate Antacid (MAALOX) 600 MG chewable tablet  3 times daily PRN        09/11/22 2337    ondansetron (ZOFRAN-ODT) 4 MG disintegrating tablet  Every 8 hours PRN        09/11/22 2337              Evlyn Courier, PA-C 09/11/22 2343    Gareth Morgan, MD 09/12/22 1429

## 2022-09-12 NOTE — ED Notes (Signed)
Discharge paperwork reviewed entirely with patient, including Rx's and follow up care. Pain was under control. Pt verbalized understanding as well as all parties involved. No questions or concerns voiced at the time of discharge. No acute distress noted.   Pt ambulated out to PVA without incident or assistance.  

## 2022-09-19 DIAGNOSIS — E119 Type 2 diabetes mellitus without complications: Secondary | ICD-10-CM | POA: Diagnosis not present

## 2022-09-19 DIAGNOSIS — H25813 Combined forms of age-related cataract, bilateral: Secondary | ICD-10-CM | POA: Diagnosis not present

## 2022-09-19 DIAGNOSIS — D3132 Benign neoplasm of left choroid: Secondary | ICD-10-CM | POA: Diagnosis not present

## 2022-09-19 DIAGNOSIS — H43393 Other vitreous opacities, bilateral: Secondary | ICD-10-CM | POA: Diagnosis not present

## 2022-10-01 DIAGNOSIS — H25811 Combined forms of age-related cataract, right eye: Secondary | ICD-10-CM | POA: Diagnosis not present

## 2022-10-01 DIAGNOSIS — H25813 Combined forms of age-related cataract, bilateral: Secondary | ICD-10-CM | POA: Diagnosis not present

## 2022-10-30 DIAGNOSIS — H268 Other specified cataract: Secondary | ICD-10-CM | POA: Diagnosis not present

## 2022-10-30 DIAGNOSIS — H269 Unspecified cataract: Secondary | ICD-10-CM | POA: Diagnosis not present

## 2022-10-30 DIAGNOSIS — H25811 Combined forms of age-related cataract, right eye: Secondary | ICD-10-CM | POA: Diagnosis not present

## 2022-11-05 DIAGNOSIS — H25812 Combined forms of age-related cataract, left eye: Secondary | ICD-10-CM | POA: Diagnosis not present

## 2022-11-20 DIAGNOSIS — H25812 Combined forms of age-related cataract, left eye: Secondary | ICD-10-CM | POA: Diagnosis not present

## 2022-11-20 DIAGNOSIS — H268 Other specified cataract: Secondary | ICD-10-CM | POA: Diagnosis not present

## 2023-01-03 ENCOUNTER — Other Ambulatory Visit: Payer: Self-pay

## 2023-01-03 ENCOUNTER — Telehealth: Payer: Self-pay | Admitting: Family Medicine

## 2023-01-03 ENCOUNTER — Other Ambulatory Visit: Payer: Self-pay | Admitting: Family Medicine

## 2023-01-03 DIAGNOSIS — K219 Gastro-esophageal reflux disease without esophagitis: Secondary | ICD-10-CM

## 2023-01-03 DIAGNOSIS — E118 Type 2 diabetes mellitus with unspecified complications: Secondary | ICD-10-CM

## 2023-01-03 MED ORDER — ACCU-CHEK GUIDE VI STRP: ORAL_STRIP | 12 refills | Status: DC

## 2023-01-03 MED ORDER — PANTOPRAZOLE SODIUM 40 MG PO TBEC: 40.0000 mg | DELAYED_RELEASE_TABLET | Freq: Two times a day (BID) | ORAL | 0 refills | Status: DC

## 2023-01-03 MED ORDER — LANCETS MISC: 1.0000 | Freq: Two times a day (BID) | 3 refills | Status: DC

## 2023-01-03 NOTE — Telephone Encounter (Signed)
Refills sent

## 2023-01-03 NOTE — Telephone Encounter (Signed)
Patient called and is needing a med refill on  pantoprazole (PROTONIX) 40 MG tablet and the lancets.

## 2023-01-03 NOTE — Telephone Encounter (Signed)
Refill was sent. Original message did not include this request.

## 2023-01-03 NOTE — Telephone Encounter (Signed)
Pt called to follow up on med refill status. Pt stated that he also requested the following medication when he spoke to FO rep:  glucose blood (ACCU-CHEK GUIDE) test strip [[id number]]  DISCONTINUED

## 2023-01-12 ENCOUNTER — Other Ambulatory Visit: Payer: Self-pay | Admitting: Family Medicine

## 2023-01-12 DIAGNOSIS — E118 Type 2 diabetes mellitus with unspecified complications: Secondary | ICD-10-CM

## 2023-01-22 NOTE — Progress Notes (Addendum)
[facility] [address] [place], [place] [address] [phone]  Date:  01/23/2023   Name:  Samuel Huffman   DOB:  1966/05/02   MRN:  [id number]  PCP:  Pearline Cables, MD    Chief Complaint: 6 month follow up (Concerns/ questions: none/Foot exam is due/)   History of Present Illness:  Samuel Huffman is a 57 y.o. very pleasant male patient who presents with the following:  Patient seen today for periodic follow-up Most recent visit with myself was in January of this year History of diabetes with history of DKA, recurrent pancreatitis, cirrhosis, GERD, splenectomy, hypertension   He had both his cataracts removed and he is really happy with the results   He did have an episode of pancreatitis about a year ago but this has not recurred He was seen in the ER in March with acute gastritis-he was evaluated and released to home; lipase was normal at that time but his sodium was low at 130 No further sx from his   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-reviewed immunizations  With lab results, will review his immunizations-we can give another dose of Pneumovax and meningitis this year Pneumococcal polysaccharide 0.5 mL IM 5 years after the first dose of this vaccine  Meningococcal vaccine 0.5 mL IM recommended every 5 years  No additional haemophilus vaccine is needed  Seasonal influenza vaccine is indicated annually  It looks like he needs a dose of meningococcal vaccine Due for foot exam  I asked him to come in for short-term PSA in January but this did not happen as of yet, will do this today  He does have a BP machine at home and is able to monitor his blood pressures  BP Readings from Last 3 Encounters:  01/23/23 138/80  09/11/22 (!) 158/114  07/25/22 (!) 142/86    Lab Results  Component Value Date   PSA 9.57 (H) 07/25/2022   PSA 1.13 07/24/2021    Lab Results  Component Value Date   HGBA1C 6.6 (H) 07/25/2022     Patient Active Problem List   Diagnosis Date Noted   Acute recurrent pancreatitis 12/20/2021   Diabetes mellitus (HCC) 05/28/2020   DKA (diabetic ketoacidosis) (HCC) 05/28/2020   Microcytic anemia 04/10/2017   SIRS (systemic inflammatory response syndrome) (HCC) 04/10/2017   Liver cirrhosis (HCC) 04/10/2017   GERD (gastroesophageal reflux disease) 04/10/2017   Cirrhosis of liver with ascites (HCC)    S/P laparoscopy 12/21/2016   Bleeding gastric varices    Hyponatremia 08/04/2016   Hypokalemia 08/04/2016   Abnormal CT scan, stomach    Upper GI bleed 08/02/2016   Hematemesis 08/02/2016   Pancreatic pseudocyst    Pulmonary nodule    Acute on chronic pancreatitis (HCC) 10/01/2014   AKI (acute kidney injury) (HCC) 10/01/2014   History of alcohol abuse 10/01/2014   Lactic acidosis 10/01/2014   Insomnia 07/05/2014    Past Medical History:  Diagnosis Date   Acute upper GI bleeding 08/02/2016   Anxiety    Blood transfusion without reported diagnosis    Cirrhosis of liver with ascites (HCC)    Diabetes mellitus without complication (HCC)    Hiatal hernia    History of alcohol abuse    Hypertension    Insomnia    Pancreatic pseudocyst    Pancreatitis    Portal hypertensive gastropathy (HCC)    PTSD (post-traumatic stress disorder)    Pulmonary nodule  Varices, gastric     Past Surgical History:  Procedure Laterality Date   ESOPHAGOGASTRODUODENOSCOPY N/A 12/18/2016   Procedure: ESOPHAGOGASTRODUODENOSCOPY (EGD);  Surgeon: Hilarie Fredrickson, MD;  Location: [facility];  Service: Endoscopy;  Laterality: N/A;   ESOPHAGOGASTRODUODENOSCOPY (EGD) WITH PROPOFOL N/A 08/03/2016   Procedure: ESOPHAGOGASTRODUODENOSCOPY (EGD) WITH PROPOFOL;  Surgeon: Rachael Fee, MD;  Location: [facility] ENDOSCOPY;  Service: Endoscopy;  Laterality: N/A;   EUS  08/03/2016   Procedure: UPPER ENDOSCOPIC ULTRASOUND (EUS) LINEAR;  Surgeon: Rachael Fee, MD;  Location: [facility] ENDOSCOPY;  Service: Endoscopy;;    FEMUR SURGERY Left    LAPAROSCOPY N/A 12/21/2016   Procedure: LAPAROSCOPY LYSIS OF ADHESIONS, EXPLORATORY LAPAROTOMY, SPLENECTOMY;  Surgeon: Avel Peace, MD;  Location: [facility] ORS;  Service: General;  Laterality: N/A;   SPLENECTOMY      Social History   Tobacco Use   Smoking status: Some Days    Current packs/day: 0.50    Types: Cigarettes   Smokeless tobacco: Current    Types: Snuff  Vaping Use   Vaping status: Never Used  Substance Use Topics   Alcohol use: No   Drug use: No    Family History  Adopted: Yes  Problem Relation Age of Onset   Healthy Son        x1    No Known Allergies  Medication list has been reviewed and updated.  Current Outpatient Medications on File Prior to Visit  Medication Sig Dispense Refill   glimepiride (AMARYL) 4 MG tablet TAKE 1 TABLET(4 MG) BY MOUTH DAILY WITH BREAKFAST 90 tablet 3   glucose blood (ACCU-CHEK GUIDE) test strip Use as instructed 100 each 12   Lancets MISC 1 each by Does not apply route in the morning and at bedtime. 100 each 3   losartan (COZAAR) 50 MG tablet TAKE 1 TABLET(50 MG) BY MOUTH DAILY 90 tablet 3   metFORMIN (GLUCOPHAGE-XR) 750 MG 24 hr tablet Take 1 tablet (750 mg total) by mouth 2 (two) times daily with a meal. 180 tablet 3   Multiple Vitamins-Minerals (MULTIVITAMIN ADULT) CHEW Chew 1 each by mouth daily.     polyethylene glycol (MIRALAX / GLYCOLAX) 17 g packet Take 17 g by mouth daily as needed for mild constipation. 14 each 0   No current facility-administered medications on file prior to visit.    Review of Systems:  As per HPI- otherwise negative.   Physical Examination: Vitals:   01/23/23 1539 01/23/23 1603  BP: (!) 142/80 138/80  Pulse: 95   Resp: 18   Temp: 98.2 F (36.8 C)   SpO2: 98%    Vitals:   01/23/23 1539  Weight: 185 lb (83.9 kg)  Height: 6\' 1"  (1.854 m)   Body mass index is 24.41 kg/m. Ideal Body Weight: Weight in (lb) to have BMI = 25: 189.1  GEN: no acute distress.  Normal  weight, looks well HEENT: Atraumatic, Normocephalic.  Ears and Nose: No external deformity. CV: RRR, No M/G/R. No JVD. No thrill. No extra heart sounds. PULM: CTA B, no wheezes, crackles, rhonchi. No retractions. No resp. distress. No accessory muscle use. ABD: S, NT, ND EXTR: No c/c/e PSYCH: Normally interactive. Conversant.    Assessment and Plan: Controlled type 2 diabetes mellitus with complication, without long-term current use of insulin (HCC) - Plan: Basic metabolic panel, Hemoglobin A1c  Macrocytic anemia - Plan: CBC  H/O splenectomy - Plan: Meningococcal MCV4O(Menveo), CANCELED: Meningococcal conjugate vaccine (Menactra)  Increased prostate specific antigen (PSA) velocity - Plan: PSA  Gastroesophageal reflux disease without esophagitis - Plan: pantoprazole (PROTONIX) 40 MG tablet  Patient seen today for follow-up.  We will monitor diabetes as above  History of macrocytic anemia, CBC pending Updated Menveo vaccine\ Follow-up on PSA, if still elevated will plan to refer to urology Patient continues to take pantoprazole on a regular basis, he does not take it GERD is a problem.  Will refill  Will advise patient about any other vaccinations he may need pending his labs  Signed Abbe Amsterdam, MD  Advised patient at visit I will look into any other vaccines he may need such as meningitis B.  On further review, he does need meningitis B booster as well-per up-to-date recommendation given meningitis B booster 1 year after completing the primary series and every 2 to 3 years thereafter   He can also get  pneumonia booster as he is 5 years out-Prevnar 20 appears to be the best choice  Patients who have received PCV13 in the past year should wait at least 1 year to receive the PCV20 or complete the PPSV23 series (consider minimum interval (8 weeks) for adults with an immunocompromising condition, cochlear implant, or cerebrospinal fluid leak (CSF) leak)  If 2 doses of the  PPSV23 series have been given in the past year, PCV20 should be given 1 year after the last PPSV23.  If 1 dose of PCV13 and PPSV23 have been given in the past, but patient has not completed recommended series, PCV20 should be given 1 year after the last PPSV23.   Pt does not have mychart- called him 8/2 with results  A1c shows good control of blood sugars  PSA has come down some, but I am concerned that it is still high, especially compared to 1 year ago.  Will place a referral to urology Will mail letter with other lab details Results for orders placed or performed in visit on 01/23/23  Basic metabolic panel  Result Value Ref Range   Sodium 137 135 - 145 mEq/L   Potassium 5.1 3.5 - 5.1 mEq/L   Chloride 97 96 - 112 mEq/L   CO2 32 19 - 32 mEq/L   Glucose, Bld 142 (H) 70 - 99 mg/dL   BUN 23 6 - 23 mg/dL   Creatinine, Ser 4.09 0.40 - 1.50 mg/dL   GFR 81.19 >14.78 mL/min   Calcium 10.3 8.4 - 10.5 mg/dL  Hemoglobin G9F  Result Value Ref Range   Hgb A1c MFr Bld 6.7 (H) 4.6 - 6.5 %  CBC  Result Value Ref Range   WBC 8.9 4.0 - 10.5 K/uL   RBC 3.47 (L) 4.22 - 5.81 Mil/uL   Platelets 313.0 150.0 - 400.0 K/uL   Hemoglobin 12.1 (L) 13.0 - 17.0 g/dL   HCT 62.1 (L) 30.8 - 65.7 %   MCV 106.0 (H) 78.0 - 100.0 fl   MCHC 33.0 30.0 - 36.0 g/dL   RDW 84.6 96.2 - 95.2 %  PSA  Result Value Ref Range   PSA 4.89 (H) 0.10 - 4.00 ng/mL

## 2023-01-23 ENCOUNTER — Ambulatory Visit (INDEPENDENT_AMBULATORY_CARE_PROVIDER_SITE_OTHER): Payer: BC Managed Care – PPO | Admitting: Family Medicine

## 2023-01-23 VITALS — BP 138/80 | HR 95 | Temp 98.2°F | Resp 18 | Ht 73.0 in | Wt 185.0 lb

## 2023-01-23 DIAGNOSIS — R972 Elevated prostate specific antigen [PSA]: Secondary | ICD-10-CM | POA: Diagnosis not present

## 2023-01-23 DIAGNOSIS — Z23 Encounter for immunization: Secondary | ICD-10-CM | POA: Diagnosis not present

## 2023-01-23 DIAGNOSIS — Z7984 Long term (current) use of oral hypoglycemic drugs: Secondary | ICD-10-CM

## 2023-01-23 DIAGNOSIS — Z9081 Acquired absence of spleen: Secondary | ICD-10-CM

## 2023-01-23 DIAGNOSIS — D539 Nutritional anemia, unspecified: Secondary | ICD-10-CM | POA: Diagnosis not present

## 2023-01-23 DIAGNOSIS — E118 Type 2 diabetes mellitus with unspecified complications: Secondary | ICD-10-CM

## 2023-01-23 DIAGNOSIS — K219 Gastro-esophageal reflux disease without esophagitis: Secondary | ICD-10-CM

## 2023-01-23 MED ORDER — PANTOPRAZOLE SODIUM 40 MG PO TBEC: 40.0000 mg | DELAYED_RELEASE_TABLET | Freq: Every day | ORAL | 3 refills | Status: DC

## 2023-01-23 NOTE — Patient Instructions (Addendum)
It was good to see you today- I will be in touch with your labs asap Assuming your A1c is still quite good I think you can stop using glimepiride- or at least do not take it if you will not be eating much that day!  This drug can drop your blood sugars  You got an updated meningitis vaccine today- we can do a meningitis B vaccine booster the next time we see you

## 2023-01-25 NOTE — Addendum Note (Signed)
Addended by: Abbe Amsterdam C on: 01/25/2023 01:51 PM   Modules accepted: Orders

## 2023-01-31 ENCOUNTER — Ambulatory Visit: Payer: BC Managed Care – PPO | Admitting: Urology

## 2023-02-06 ENCOUNTER — Encounter: Payer: Self-pay | Admitting: Urology

## 2023-02-06 ENCOUNTER — Ambulatory Visit (INDEPENDENT_AMBULATORY_CARE_PROVIDER_SITE_OTHER): Payer: BC Managed Care – PPO | Admitting: Urology

## 2023-02-06 VITALS — BP 117/73 | HR 93 | Ht 74.0 in | Wt 185.0 lb

## 2023-02-06 DIAGNOSIS — R972 Elevated prostate specific antigen [PSA]: Secondary | ICD-10-CM

## 2023-02-06 NOTE — Progress Notes (Signed)
Assessment: 1. Elevated PSA     Plan: I personally reviewed the patient's chart including provider notes, and lab results. Today I had a long discussion with the patient regarding PSA and the rationale and controversies of prostate cancer early detection.  I discussed the pros and cons of further evaluation including TRUS and prostate Bx.  Potential adverse events and complications as well as standard instructions were given.  Patient expressed his understanding of these issues. IsoPSA drawn today - will call with results  Chief Complaint:  Chief Complaint  Patient presents with   Elevated PSA    History of Present Illness:  Samuel Huffman is a 57 y.o. male who is seen in consultation from Copland, Gwenlyn Found, MD for evaluation of elevated PSA.  PSA results: 1/23 1.13 1/24 9.57 7/24 4.89  No prior prostate biopsy.  No family history of prostate cancer.  No history of prostatitis or UTIs.  No lower urinary tract symptoms.  Past Medical History:  Past Medical History:  Diagnosis Date   Acute upper GI bleeding 08/02/2016   Anxiety    Blood transfusion without reported diagnosis    Cirrhosis of liver with ascites (HCC)    Diabetes mellitus without complication (HCC)    Hiatal hernia    History of alcohol abuse    Hypertension    Insomnia    Pancreatic pseudocyst    Pancreatitis    Portal hypertensive gastropathy (HCC)    PTSD (post-traumatic stress disorder)    Pulmonary nodule    Varices, gastric     Past Surgical History:  Past Surgical History:  Procedure Laterality Date   ESOPHAGOGASTRODUODENOSCOPY N/A 12/18/2016   Procedure: ESOPHAGOGASTRODUODENOSCOPY (EGD);  Surgeon: Hilarie Fredrickson, MD;  Location: [facility];  Service: Endoscopy;  Laterality: N/A;   ESOPHAGOGASTRODUODENOSCOPY (EGD) WITH PROPOFOL N/A 08/03/2016   Procedure: ESOPHAGOGASTRODUODENOSCOPY (EGD) WITH PROPOFOL;  Surgeon: Rachael Fee, MD;  Location: [facility] ENDOSCOPY;  Service: Endoscopy;  Laterality:  N/A;   EUS  08/03/2016   Procedure: UPPER ENDOSCOPIC ULTRASOUND (EUS) LINEAR;  Surgeon: Rachael Fee, MD;  Location: [facility] ENDOSCOPY;  Service: Endoscopy;;   FEMUR SURGERY Left    LAPAROSCOPY N/A 12/21/2016   Procedure: LAPAROSCOPY LYSIS OF ADHESIONS, EXPLORATORY LAPAROTOMY, SPLENECTOMY;  Surgeon: Avel Peace, MD;  Location: [facility];  Service: General;  Laterality: N/A;   SPLENECTOMY      Allergies:  No Known Allergies  Family History:  Family History  Adopted: Yes  Problem Relation Age of Onset   Healthy Son        x1    Social History:  Social History   Tobacco Use   Smoking status: Some Days    Current packs/day: 0.50    Types: Cigarettes   Smokeless tobacco: Current    Types: Snuff  Vaping Use   Vaping status: Never Used  Substance Use Topics   Alcohol use: No   Drug use: No    Review of symptoms:  Constitutional:  Negative for unexplained weight loss, night sweats, fever, chills ENT:  Negative for nose bleeds, sinus pain, painful swallowing CV:  Negative for chest pain, shortness of breath, exercise intolerance, palpitations, loss of consciousness Resp:  Negative for cough, wheezing, shortness of breath GI:  Negative for nausea, vomiting, diarrhea, bloody stools GU:  Positives noted in HPI; otherwise negative for gross hematuria, dysuria, urinary incontinence Neuro:  Negative for seizures, poor balance, limb weakness, slurred speech Psych:  Negative for lack of energy, depression, anxiety Endocrine:  Negative  for polydipsia, polyuria, symptoms of hypoglycemia (dizziness, hunger, sweating) Hematologic:  Negative for anemia, purpura, petechia, prolonged or excessive bleeding, use of anticoagulants  Allergic:  Negative for difficulty breathing or choking as a result of exposure to anything; no shellfish allergy; no allergic response (rash/itch) to materials, foods  Physical exam: BP 117/73   Pulse 93   Ht 6\' 2"  (1.88 m)   Wt 185 lb (83.9 kg)   BMI 23.75 kg/m   GENERAL APPEARANCE:  Well appearing, well developed, well nourished, NAD HEENT: Atraumatic, Normocephalic, oropharynx clear. NECK: Supple without lymphadenopathy or thyromegaly. LUNGS: Clear to auscultation bilaterally. HEART: Regular Rate and Rhythm without murmurs, gallops, or rubs. ABDOMEN: Soft, non-tender, No Masses. EXTREMITIES: Moves all extremities well.  Without clubbing, cyanosis, or edema. NEUROLOGIC:  Alert and oriented x 3, normal gait, CN II-XII grossly intact.  MENTAL STATUS:  Appropriate. BACK:  Non-tender to palpation.  No CVAT SKIN:  Warm, dry and intact.   GU: Penis:  circumcised Meatus: Normal Scrotum: normal, no masses Testis: normal without masses bilateral Epididymis: normal Prostate: 40 g, NT, no nodules Rectum: Normal tone,  no masses or tenderness   Results: U/A: trace protein

## 2023-02-07 LAB — URINALYSIS, ROUTINE W REFLEX MICROSCOPIC
Bilirubin, UA: NEGATIVE
Leukocytes,UA: NEGATIVE
Nitrite, UA: NEGATIVE
RBC, UA: NEGATIVE
Specific Gravity, UA: 1.03 (ref 1.005–1.030)
Urobilinogen, Ur: 0.2 mg/dL (ref 0.2–1.0)
pH, UA: 6.5 (ref 5.0–7.5)

## 2023-02-11 ENCOUNTER — Encounter: Payer: Self-pay | Admitting: Urology

## 2023-02-13 ENCOUNTER — Telehealth: Payer: Self-pay | Admitting: Urology

## 2023-02-13 NOTE — Telephone Encounter (Signed)
Pt returning call regarding results. 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 In [place] now and should be ok to talk

## 2023-02-13 NOTE — Telephone Encounter (Signed)
Patient was contacted regarding the recent iso-PSA results. His PSA was 3.54 which is decreased from the prior levels.  Due to the normal PSA, iso-PSA index was not performed. I discussed these findings with the patient today.  Given the normal PSA and the continued decrease, I think it be reasonable to monitor his PSA. Recommend a follow-up in 6 months with PSA.

## 2023-03-22 ENCOUNTER — Other Ambulatory Visit: Payer: Self-pay | Admitting: Family Medicine

## 2023-03-22 DIAGNOSIS — K219 Gastro-esophageal reflux disease without esophagitis: Secondary | ICD-10-CM

## 2023-04-16 ENCOUNTER — Telehealth: Payer: Self-pay | Admitting: Family Medicine

## 2023-04-16 DIAGNOSIS — Z9081 Acquired absence of spleen: Secondary | ICD-10-CM

## 2023-04-17 MED ORDER — AMOXICILLIN-POT CLAVULANATE 875-125 MG PO TABS: 1.0000 | ORAL_TABLET | Freq: Two times a day (BID) | ORAL | 1 refills | Status: DC

## 2023-04-17 NOTE — Telephone Encounter (Signed)
Okay for refill of Augmentin?

## 2023-04-17 NOTE — Addendum Note (Signed)
Addended by: Abbe Amsterdam C on: 04/17/2023 12:52 PM   Modules accepted: Orders

## 2023-04-17 NOTE — Telephone Encounter (Signed)
Pt states he always gets a refill on his antibiotics so he has them on hand incase he needs them.   [id number] [place], [place] - [address] AT [facility] [address], [place] [address] Phone: [phone]  Fax: [phone]

## 2023-05-02 ENCOUNTER — Other Ambulatory Visit: Payer: Self-pay | Admitting: Family Medicine

## 2023-05-02 DIAGNOSIS — E118 Type 2 diabetes mellitus with unspecified complications: Secondary | ICD-10-CM

## 2023-06-27 ENCOUNTER — Other Ambulatory Visit: Payer: Self-pay | Admitting: Family Medicine

## 2023-06-27 DIAGNOSIS — E118 Type 2 diabetes mellitus with unspecified complications: Secondary | ICD-10-CM

## 2023-06-29 ENCOUNTER — Other Ambulatory Visit: Payer: Self-pay | Admitting: Family Medicine

## 2023-06-29 DIAGNOSIS — K219 Gastro-esophageal reflux disease without esophagitis: Secondary | ICD-10-CM

## 2023-07-27 NOTE — Progress Notes (Unsigned)
[facility] [address] [place], [place] [address] [phone]  Date:  07/29/2023   Name:  Samuel Huffman   DOB:  05-12-1966   MRN:  [id number]  PCP:  Pearline Cables, MD    Chief Complaint: No chief complaint on file.   History of Present Illness:  Samuel Huffman is a 58 y.o. very pleasant male patient who presents with the following:  Patient seen today for periodic follow-up Most recent visit with myself was in July History of diabetes with history of DKA, recurrent pancreatitis, cirrhosis, GERD, splenectomy, hypertension  Splenectomy performed in 2018 I updated his Menveo in July  He now needs a meningitis B and a Prevnar 20  Referral to urology made regarding PSA  Flu vaccine Eye exam Needs foot exam Needs A1c Needs urine microalbumin  Lab Results  Component Value Date   PSA 4.89 (H) 01/23/2023   PSA 9.57 (H) 07/25/2022   PSA 1.13 07/24/2021      Patient Active Problem List   Diagnosis Date Noted   Elevated PSA 02/06/2023   Acute recurrent pancreatitis 12/20/2021   Diabetes mellitus (HCC) 05/28/2020   DKA (diabetic ketoacidosis) (HCC) 05/28/2020   Microcytic anemia 04/10/2017   SIRS (systemic inflammatory response syndrome) (HCC) 04/10/2017   Liver cirrhosis (HCC) 04/10/2017   GERD (gastroesophageal reflux disease) 04/10/2017   Cirrhosis of liver with ascites (HCC)    S/P laparoscopy 12/21/2016   Bleeding gastric varices    Hyponatremia 08/04/2016   Hypokalemia 08/04/2016   Abnormal CT scan, stomach    Upper GI bleed 08/02/2016   Hematemesis 08/02/2016   Pancreatic pseudocyst    Pulmonary nodule    Acute on chronic pancreatitis (HCC) 10/01/2014   AKI (acute kidney injury) (HCC) 10/01/2014   History of alcohol abuse 10/01/2014   Lactic acidosis 10/01/2014   Insomnia 07/05/2014    Past Medical History:  Diagnosis Date   Acute upper GI bleeding 08/02/2016   Anxiety    Blood  transfusion without reported diagnosis    Cirrhosis of liver with ascites (HCC)    Diabetes mellitus without complication (HCC)    Hiatal hernia    History of alcohol abuse    Hypertension    Insomnia    Pancreatic pseudocyst    Pancreatitis    Portal hypertensive gastropathy (HCC)    PTSD (post-traumatic stress disorder)    Pulmonary nodule    Varices, gastric     Past Surgical History:  Procedure Laterality Date   ESOPHAGOGASTRODUODENOSCOPY N/A 12/18/2016   Procedure: ESOPHAGOGASTRODUODENOSCOPY (EGD);  Surgeon: Hilarie Fredrickson, MD;  Location: [facility];  Service: Endoscopy;  Laterality: N/A;   ESOPHAGOGASTRODUODENOSCOPY (EGD) WITH PROPOFOL N/A 08/03/2016   Procedure: ESOPHAGOGASTRODUODENOSCOPY (EGD) WITH PROPOFOL;  Surgeon: Rachael Fee, MD;  Location: [facility] ENDOSCOPY;  Service: Endoscopy;  Laterality: N/A;   EUS  08/03/2016   Procedure: UPPER ENDOSCOPIC ULTRASOUND (EUS) LINEAR;  Surgeon: Rachael Fee, MD;  Location: [facility] ENDOSCOPY;  Service: Endoscopy;;   FEMUR SURGERY Left    LAPAROSCOPY N/A 12/21/2016   Procedure: LAPAROSCOPY LYSIS OF ADHESIONS, EXPLORATORY LAPAROTOMY, SPLENECTOMY;  Surgeon: Avel Peace, MD;  Location: [facility];  Service: General;  Laterality: N/A;   SPLENECTOMY      Social History   Tobacco Use   Smoking status: Some Days    Current packs/day: 0.50    Types: Cigarettes   Smokeless tobacco: Current    Types: Snuff  Vaping  Use   Vaping status: Never Used  Substance Use Topics   Alcohol use: No   Drug use: No    Family History  Adopted: Yes  Problem Relation Age of Onset   Healthy Son        x1    No Known Allergies  Medication list has been reviewed and updated.  Current Outpatient Medications on File Prior to Visit  Medication Sig Dispense Refill   Accu-Chek Softclix Lancets lancets USE AS DIRECTED IN THE MORNING AND EVENING 100 each 3   amoxicillin-clavulanate (AUGMENTIN) 875-125 MG tablet Take 1 tablet by mouth 2 (two) times daily. 20  tablet 1   glimepiride (AMARYL) 4 MG tablet TAKE 1 TABLET(4 MG) BY MOUTH DAILY WITH BREAKFAST 90 tablet 3   glucose blood (ACCU-CHEK GUIDE) test strip Use as instructed 100 each 12   losartan (COZAAR) 50 MG tablet TAKE 1 TABLET(50 MG) BY MOUTH DAILY 90 tablet 3   metFORMIN (GLUCOPHAGE-XR) 750 MG 24 hr tablet Take 1 tablet (750 mg total) by mouth 2 (two) times daily with a meal. 180 tablet 3   Multiple Vitamins-Minerals (MULTIVITAMIN ADULT) CHEW Chew 1 each by mouth daily.     pantoprazole (PROTONIX) 40 MG tablet Take 1 tablet (40 mg total) by mouth 2 (two) times daily before a meal. 180 tablet 1   polyethylene glycol (MIRALAX / GLYCOLAX) 17 g packet Take 17 g by mouth daily as needed for mild constipation. 14 each 0   No current facility-administered medications on file prior to visit.    Review of Systems:  As per HPI- otherwise negative.   Physical Examination: There were no vitals filed for this visit. There were no vitals filed for this visit. There is no height or weight on file to calculate BMI. Ideal Body Weight:    GEN: no acute distress. HEENT: Atraumatic, Normocephalic.  Ears and Nose: No external deformity. CV: RRR, No M/G/R. No JVD. No thrill. No extra heart sounds. PULM: CTA B, no wheezes, crackles, rhonchi. No retractions. No resp. distress. No accessory muscle use. ABD: S, NT, ND, +BS. No rebound. No HSM. EXTR: No c/c/e PSYCH: Normally interactive. Conversant.    Assessment and Plan: ***  Signed Abbe Amsterdam, MD

## 2023-07-29 ENCOUNTER — Ambulatory Visit (INDEPENDENT_AMBULATORY_CARE_PROVIDER_SITE_OTHER): Payer: BC Managed Care – PPO | Admitting: Family Medicine

## 2023-07-29 VITALS — BP 150/95 | HR 94 | Temp 97.8°F | Resp 18 | Ht 73.0 in | Wt 190.0 lb

## 2023-07-29 DIAGNOSIS — Z23 Encounter for immunization: Secondary | ICD-10-CM

## 2023-07-29 DIAGNOSIS — Z7984 Long term (current) use of oral hypoglycemic drugs: Secondary | ICD-10-CM

## 2023-07-29 DIAGNOSIS — D539 Nutritional anemia, unspecified: Secondary | ICD-10-CM | POA: Diagnosis not present

## 2023-07-29 DIAGNOSIS — Z130 Encounter for screening for diseases of the blood and blood-forming organs and certain disorders involving the immune mechanism: Secondary | ICD-10-CM | POA: Diagnosis not present

## 2023-07-29 DIAGNOSIS — E118 Type 2 diabetes mellitus with unspecified complications: Secondary | ICD-10-CM

## 2023-07-29 DIAGNOSIS — Z13220 Encounter for screening for lipoid disorders: Secondary | ICD-10-CM | POA: Diagnosis not present

## 2023-07-29 DIAGNOSIS — R972 Elevated prostate specific antigen [PSA]: Secondary | ICD-10-CM | POA: Diagnosis not present

## 2023-07-29 DIAGNOSIS — Z9081 Acquired absence of spleen: Secondary | ICD-10-CM | POA: Diagnosis not present

## 2023-07-29 DIAGNOSIS — K219 Gastro-esophageal reflux disease without esophagitis: Secondary | ICD-10-CM

## 2023-07-29 MED ORDER — PANTOPRAZOLE SODIUM 40 MG PO TBEC: 40.0000 mg | DELAYED_RELEASE_TABLET | Freq: Two times a day (BID) | ORAL | 3 refills | Status: DC

## 2023-07-29 MED ORDER — METFORMIN HCL ER 750 MG PO TB24: 750.0000 mg | ORAL_TABLET | Freq: Two times a day (BID) | ORAL | 3 refills | Status: DC

## 2023-07-29 NOTE — Patient Instructions (Addendum)
Good to see you again today- I will be in touch with your labs Please let me know if you start having any difficulty getting your metformin rx filled or paid for Flu shot and 5 years pneumonia booster today  Assuming all is well please see me in about 6 months

## 2023-07-30 ENCOUNTER — Encounter: Payer: Self-pay | Admitting: Family Medicine

## 2023-07-30 LAB — MICROALBUMIN / CREATININE URINE RATIO
Creatinine,U: 102.8 mg/dL
Microalb Creat Ratio: 14.9 mg/g (ref 0.0–30.0)
Microalb, Ur: 15.3 mg/dL — ABNORMAL HIGH (ref 0.0–1.9)

## 2023-07-30 LAB — COMPREHENSIVE METABOLIC PANEL
ALT: 26 U/L (ref 0–53)
AST: 33 U/L (ref 0–37)
Albumin: 4.7 g/dL (ref 3.5–5.2)
Alkaline Phosphatase: 55 U/L (ref 39–117)
BUN: 26 mg/dL — ABNORMAL HIGH (ref 6–23)
CO2: 29 meq/L (ref 19–32)
Calcium: 9.9 mg/dL (ref 8.4–10.5)
Chloride: 98 meq/L (ref 96–112)
Creatinine, Ser: 0.87 mg/dL (ref 0.40–1.50)
GFR: 95.87 mL/min (ref >60.00)
Glucose, Bld: 132 mg/dL — ABNORMAL HIGH (ref 70–99)
Potassium: 5.1 meq/L (ref 3.5–5.1)
Sodium: 139 meq/L (ref 135–145)
Total Bilirubin: 0.5 mg/dL (ref 0.2–1.2)
Total Protein: 7.5 g/dL (ref 6.0–8.3)

## 2023-07-30 LAB — CBC
HCT: 38.1 % — ABNORMAL LOW (ref 39.0–52.0)
Hemoglobin: 12.8 g/dL — ABNORMAL LOW (ref 13.0–17.0)
MCHC: 33.5 g/dL (ref 30.0–36.0)
MCV: 106.4 fL — ABNORMAL HIGH (ref 78.0–100.0)
Platelets: 303 10*3/uL (ref 150.0–400.0)
RBC: 3.58 Mil/uL — ABNORMAL LOW (ref 4.22–5.81)
RDW: 13.1 % (ref 11.5–15.5)
WBC: 7.5 10*3/uL (ref 4.0–10.5)

## 2023-07-30 LAB — LIPID PANEL
Cholesterol: 222 mg/dL — ABNORMAL HIGH (ref 0–200)
HDL: 124.7 mg/dL (ref >39.00)
LDL Cholesterol: 80 mg/dL (ref 0–99)
NonHDL: 97.24
Total CHOL/HDL Ratio: 2
Triglycerides: 86 mg/dL (ref 0.0–149.0)
VLDL: 17.2 mg/dL (ref 0.0–40.0)

## 2023-07-30 LAB — PSA: PSA: 3.97 ng/mL (ref 0.10–4.00)

## 2023-07-30 LAB — HEMOGLOBIN A1C: Hgb A1c MFr Bld: 6.6 % — ABNORMAL HIGH (ref 4.6–6.5)

## 2023-08-08 ENCOUNTER — Ambulatory Visit: Payer: BC Managed Care – PPO | Admitting: Urology

## 2023-08-08 NOTE — Progress Notes (Deleted)
   Assessment: 1. Elevated PSA     Plan: Recommend 4K test in 3 months  Chief Complaint:  No chief complaint on file.   History of Present Illness:  Samuel Huffman is a 58 y.o. male who is seen for further evaluation of elevated PSA.  PSA results: 1/23 1.13 1/24 9.57 7/24 4.89 8/24 3.54 2/25 3.97  No prior prostate biopsy.  No family history of prostate cancer.  No history of prostatitis or UTIs.  No lower urinary tract symptoms.  Past Medical History:  Past Medical History:  Diagnosis Date   Acute upper GI bleeding 08/02/2016   Anxiety    Blood transfusion without reported diagnosis    Cirrhosis of liver with ascites (HCC)    Diabetes mellitus without complication (HCC)    Hiatal hernia    History of alcohol abuse    Hypertension    Insomnia    Pancreatic pseudocyst    Pancreatitis    Portal hypertensive gastropathy (HCC)    PTSD (post-traumatic stress disorder)    Pulmonary nodule    Varices, gastric     Past Surgical History:  Past Surgical History:  Procedure Laterality Date   ESOPHAGOGASTRODUODENOSCOPY N/A 12/18/2016   Procedure: ESOPHAGOGASTRODUODENOSCOPY (EGD);  Surgeon: Hilarie Fredrickson, MD;  Location: [facility];  Service: Endoscopy;  Laterality: N/A;   ESOPHAGOGASTRODUODENOSCOPY (EGD) WITH PROPOFOL N/A 08/03/2016   Procedure: ESOPHAGOGASTRODUODENOSCOPY (EGD) WITH PROPOFOL;  Surgeon: Rachael Fee, MD;  Location: [facility] ENDOSCOPY;  Service: Endoscopy;  Laterality: N/A;   EUS  08/03/2016   Procedure: UPPER ENDOSCOPIC ULTRASOUND (EUS) LINEAR;  Surgeon: Rachael Fee, MD;  Location: [facility] END[facility]COPY;  Service: Endoscopy;;   FEMUR SURGERY Left    LAPAROSCOPY N/A 12/21/2016   Procedure: LAPAROSCOPY LYSIS OF ADHESIONS, EXPLORATORY LAPAROTOMY, SPLENECTOMY;  Surgeon: Avel Peace, MD;  Location: [facility];  Service: General;  Laterality: N/A;   SPLENECTOMY      Allergies:  No Known Allergies  Family History:  Family History  Adopted: Yes  Problem Relation Age  of Onset   Healthy Son        x1    Social History:  Social History   Tobacco Use   Smoking status: Some Days    Current packs/day: 0.50    Types: Cigarettes   Smokeless tobacco: Current    Types: Snuff  Vaping Use   Vaping status: Never Used  Substance Use Topics   Alcohol use: No   Drug use: No    ROS: Constitutional:  Negative for fever, chills, weight loss CV: Negative for chest pain, previous MI, hypertension Respiratory:  Negative for shortness of breath, wheezing, sleep apnea, frequent cough GI:  Negative for nausea, vomiting, bloody stool, GERD  Physical exam: There were no vitals taken for this visit. GENERAL APPEARANCE:  Well appearing, well developed, well nourished, NAD HEENT:  Atraumatic, normocephalic, oropharynx clear NECK:  Supple without lymphadenopathy or thyromegaly ABDOMEN:  Soft, non-tender, no masses EXTREMITIES:  Moves all extremities well, without clubbing, cyanosis, or edema NEUROLOGIC:  Alert and oriented x 3, normal gait, CN II-XII grossly intact MENTAL STATUS:  appropriate BACK:  Non-tender to palpation, No CVAT SKIN:  Warm, dry, and intact   Results: U/A:

## 2023-08-24 ENCOUNTER — Other Ambulatory Visit: Payer: Self-pay | Admitting: Family Medicine

## 2023-08-24 DIAGNOSIS — E118 Type 2 diabetes mellitus with unspecified complications: Secondary | ICD-10-CM

## 2023-12-25 ENCOUNTER — Other Ambulatory Visit: Payer: Self-pay | Admitting: Family Medicine

## 2023-12-25 DIAGNOSIS — E118 Type 2 diabetes mellitus with unspecified complications: Secondary | ICD-10-CM

## 2024-01-09 DIAGNOSIS — H25813 Combined forms of age-related cataract, bilateral: Secondary | ICD-10-CM | POA: Diagnosis not present

## 2024-01-09 DIAGNOSIS — D3132 Benign neoplasm of left choroid: Secondary | ICD-10-CM | POA: Diagnosis not present

## 2024-01-09 DIAGNOSIS — H43393 Other vitreous opacities, bilateral: Secondary | ICD-10-CM | POA: Diagnosis not present

## 2024-01-09 DIAGNOSIS — E119 Type 2 diabetes mellitus without complications: Secondary | ICD-10-CM | POA: Diagnosis not present

## 2024-01-22 NOTE — Progress Notes (Signed)
 [facility] at [facility] [address], [address] [place], [place] [address] [phone]  Date:  01/27/2024   Name:  Samuel Huffman   DOB:  October 17, 1965   MRN:  [id number]  PCP:  Watt Harlene BROCKS, MD    Chief Complaint: Medical Management of Chronic Issues   History of Present Illness:  Samuel Huffman is a 58 y.o. very pleasant male patient who presents with the following:  Pt seen today for recheck visit Last seen by myself in February  History of diabetes with history of DKA, recurrent pancreatitis, cirrhosis, GERD, splenectomy, hypertension  Splenectomy performed in 2018  Dr Roseann has been monitoring his PSA  He thinks he may have a hernia in the left inguinal hernia - he has noticed for about 4 months.  He is able to push it back in. Never painful but has been persistently present.  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  They are short staffed and he is working very hard.   Lab Results  Component Value Date   PSA 3.97 07/29/2023   PSA 4.89 (H) 01/23/2023   PSA 9.57 (H) 07/25/2022    Lab Results  Component Value Date   HGBA1C 6.6 (H) 07/29/2023   BP is up today but he is under a lot of stress at work.  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  He is up-to-date on his meningitis and also pneumonia booster vaccines  Patient Active Problem List   Diagnosis Date Noted   Elevated PSA 02/06/2023   Acute recurrent pancreatitis 12/20/2021   Diabetes mellitus (HCC) 05/28/2020   DKA (diabetic ketoacidosis) (HCC) 05/28/2020   Microcytic anemia 04/10/2017   SIRS (systemic inflammatory  response syndrome) (HCC) 04/10/2017   Liver cirrhosis (HCC) 04/10/2017   GERD (gastroesophageal reflux disease) 04/10/2017   Cirrhosis of liver with ascites (HCC)    S/P laparoscopy 12/21/2016   Bleeding gastric varices    Hyponatremia 08/04/2016   Hypokalemia 08/04/2016   Abnormal CT scan, stomach    Upper GI bleed 08/02/2016   Hematemesis 08/02/2016   Pancreatic pseudocyst    Pulmonary nodule    Acute on chronic pancreatitis (HCC) 10/01/2014   AKI (acute kidney injury) (HCC) 10/01/2014   History of alcohol abuse 10/01/2014   Lactic acidosis 10/01/2014   Insomnia 07/05/2014    Past Medical History:  Diagnosis Date   Acute upper GI bleeding 08/02/2016   Anxiety    Blood transfusion without reported diagnosis    Cirrhosis of liver with ascites (HCC)    Diabetes mellitus without complication (HCC)    Hiatal hernia    History of alcohol abuse    Hypertension    Insomnia    Pancreatic pseudocyst    Pancreatitis    Portal hypertensive gastropathy (HCC)    PTSD (post-traumatic stress disorder)    Pulmonary nodule    Varices, gastric     Past Surgical History:  Procedure Laterality Date   ESOPHAGOGASTRODUODENOSCOPY N/A 12/18/2016   Procedure: ESOPHAGOGASTRODUODENOSCOPY (EGD);  Surgeon: Abran Norleen SAILOR, MD;  Location:  [facility] ENDOSCOPY;  Service: Endoscopy;  Laterality: N/A;   ESOPHAGOGASTRODUODENOSCOPY (EGD) WITH PROPOFOL  N/A 08/03/2016   Procedure: ESOPHAGOGASTRODUODENOSCOPY (EGD) WITH PROPOFOL ;  Surgeon: Toribio SHAUNNA Cedar, MD;  Location: [facility] ENDOSCOPY;  Service: Endoscopy;  Laterality: N/A;   EUS  08/03/2016   Procedure: UPPER ENDOSCOPIC ULTRASOUND (EUS) LINEAR;  Surgeon: Toribio SHAUNNA Cedar, MD;  Location: [facility] ENDOSCOPY;  Service: Endoscopy;;   FEMUR SURGERY Left    LAPAROSCOPY N/A 12/21/2016   Procedure: LAPAROSCOPY LYSIS OF ADHESIONS, EXPLORATORY LAPAROTOMY, SPLENECTOMY;  Surgeon: Lily Boas, MD;  Location: [facility];  Service: General;  Laterality: N/A;   SPLENECTOMY      Social  History   Tobacco Use   Smoking status: Some Days    Current packs/day: 0.50    Types: Cigarettes   Smokeless tobacco: Current    Types: Snuff  Vaping Use   Vaping status: Never Used  Substance Use Topics   Alcohol use: No   Drug use: No    Family History  Adopted: Yes  Problem Relation Age of Onset   Healthy Son        x1    No Known Allergies  Medication list has been reviewed and updated.  Current Outpatient Medications on File Prior to Visit  Medication Sig Dispense Refill   Accu-Chek Softclix Lancets lancets USE AS DIRECTED IN THE MORNING AND EVENING 100 each 3   glimepiride  (AMARYL ) 4 MG tablet Take 1 tablet (4 mg total) by mouth daily with breakfast. 90 tablet 0   glucose blood (ACCU-CHEK GUIDE) test strip Use as instructed 100 each 12   losartan  (COZAAR ) 50 MG tablet Take 1 tablet (50 mg total) by mouth daily. 90 tablet 0   metFORMIN  (GLUCOPHAGE -XR) 750 MG 24 hr tablet Take 1 tablet (750 mg total) by mouth 2 (two) times daily with a meal. 180 tablet 3   Multiple Vitamins-Minerals (MULTIVITAMIN ADULT) CHEW Chew 1 each by mouth daily.     pantoprazole  (PROTONIX ) 40 MG tablet Take 1 tablet (40 mg total) by mouth 2 (two) times daily before a meal. 180 tablet 3   polyethylene glycol (MIRALAX  / GLYCOLAX ) 17 g packet Take 17 g by mouth daily as needed for mild constipation. 14 each 0   No current facility-administered medications on file prior to visit.    Review of Systems:  As per HPI- otherwise negative.   Physical Examination: Vitals:   01/27/24 1516 01/27/24 1837  BP: (!) 170/98 (!) 160/95  Pulse: 100 90  SpO2: 96%    Vitals:   01/27/24 1516  Weight: 206 lb (93.4 kg)  Height: 6' 1 (1.854 m)   Body mass index is 27.18 kg/m. Ideal Body Weight: Weight in (lb) to have BMI = 25: 189.1  GEN: no acute distress.  Minimally overweight, looks well HEENT: Atraumatic, Normocephalic.  Ears and Nose: No external deformity. CV: RRR, No M/G/R. No JVD. No thrill.  No extra heart sounds. PULM: CTA B, no wheezes, crackles, rhonchi. No retractions. No resp. distress. No accessory muscle use. ABD: S, NT, ND. No rebound. No HSM. EXTR: No c/c/e PSYCH: Normally interactive. Conversant.  Has a reducible left inguinal hernia.  Nontender  Assessment and Plan: Left inguinal hernia - Plan: Ambulatory referral to General Surgery  Controlled type 2 diabetes mellitus with complication, without long-term current use of insulin  (HCC) - Plan: Basic metabolic panel with GFR, Hemoglobin A1c  Macrocytic anemia - Plan: CBC  Elevated blood pressure reading  Syncere is seen today for follow-up.  Diabetes  monitoring labs are pending as above.  He also has some history of macrocytic anemia, follow-up CBC  You have a left inguinal hernia - we will set you up with general surgery to discuss this and probably plan surgery IF you develop any severe pain or find that you cannot push the hernia back in please do go to the ER  I will be in touch with your labs asap  Please keep an eye on your BP at home- if you find you are running higher than 140/90 over the next few days please let me know and I will change your losartan  dose Assuming all is well please see me in about 6 months    Signed Harlene Schroeder, MD  Addendum 8/6, received labs as below.  Was going to send email message to patient- however I don't think he is getting email messages, prior were not read.  Called but did not reach just now.  Will try again   Results for orders placed or performed in visit on 01/27/24  CBC   Collection Time: 01/27/24  3:32 PM  Result Value Ref Range   WBC 4.9 4.0 - 10.5 K/uL   RBC 3.47 (L) 4.22 - 5.81 Mil/uL   Platelets 305.0 150.0 - 400.0 K/uL   Hemoglobin 12.1 (L) 13.0 - 17.0 g/dL   HCT 63.8 (L) 60.9 - 47.9 %   MCV 104.2 (H) 78.0 - 100.0 fl   MCHC 33.5 30.0 - 36.0 g/dL   RDW 87.3 88.4 - 84.4 %  Basic metabolic panel with GFR   Collection Time: 01/27/24  3:32 PM  Result  Value Ref Range   Sodium 136 135 - 145 mEq/L   Potassium 5.7 No hemolysis seen (H) 3.5 - 5.1 mEq/L   Chloride 97 96 - 112 mEq/L   CO2 28 19 - 32 mEq/L   Glucose, Bld 246 (H) 70 - 99 mg/dL   BUN 23 6 - 23 mg/dL   Creatinine, Ser 9.02 0.40 - 1.50 mg/dL   GFR 13.56 >39.99 mL/min   Calcium 10.3 8.4 - 10.5 mg/dL  Hemoglobin J8r   Collection Time: 01/27/24  3:32 PM  Result Value Ref Range   Hgb A1c MFr Bld 7.3 (H) 4.6 - 6.5 %

## 2024-01-27 ENCOUNTER — Ambulatory Visit: Payer: BC Managed Care – PPO | Admitting: Family Medicine

## 2024-01-27 ENCOUNTER — Encounter: Payer: Self-pay | Admitting: Family Medicine

## 2024-01-27 VITALS — BP 160/95 | HR 90 | Ht 73.0 in | Wt 206.0 lb

## 2024-01-27 DIAGNOSIS — D539 Nutritional anemia, unspecified: Secondary | ICD-10-CM | POA: Diagnosis not present

## 2024-01-27 DIAGNOSIS — R030 Elevated blood-pressure reading, without diagnosis of hypertension: Secondary | ICD-10-CM | POA: Diagnosis not present

## 2024-01-27 DIAGNOSIS — E118 Type 2 diabetes mellitus with unspecified complications: Secondary | ICD-10-CM

## 2024-01-27 DIAGNOSIS — K4090 Unilateral inguinal hernia, without obstruction or gangrene, not specified as recurrent: Secondary | ICD-10-CM | POA: Diagnosis not present

## 2024-01-27 NOTE — Patient Instructions (Addendum)
 You have a left inguinal hernia - we will set you up with general surgery to discuss this and probably plan surgery IF you develop any severe pain or find that you cannot push the hernia back in please do go to the ER  I will be in touch with your labs asap   Please keep an eye on your BP at home- if you find you are running higher than 140/90 over the next few days please let me know and I will change your losartan  dose  Assuming all is well please see me in about 6 months

## 2024-01-28 LAB — BASIC METABOLIC PANEL WITH GFR
BUN: 23 mg/dL (ref 6–23)
CO2: 28 meq/L (ref 19–32)
Calcium: 10.3 mg/dL (ref 8.4–10.5)
Chloride: 97 meq/L (ref 96–112)
Creatinine, Ser: 0.97 mg/dL (ref 0.40–1.50)
GFR: 86.43 mL/min (ref >60.00)
Glucose, Bld: 246 mg/dL — ABNORMAL HIGH (ref 70–99)
Potassium: 5.7 meq/L — ABNORMAL HIGH (ref 3.5–5.1)
Sodium: 136 meq/L (ref 135–145)

## 2024-01-28 LAB — CBC
HCT: 36.1 % — ABNORMAL LOW (ref 39.0–52.0)
Hemoglobin: 12.1 g/dL — ABNORMAL LOW (ref 13.0–17.0)
MCHC: 33.5 g/dL (ref 30.0–36.0)
MCV: 104.2 fl — ABNORMAL HIGH (ref 78.0–100.0)
Platelets: 305 K/uL (ref 150.0–400.0)
RBC: 3.47 Mil/uL — ABNORMAL LOW (ref 4.22–5.81)
RDW: 12.6 % (ref 11.5–15.5)
WBC: 4.9 K/uL (ref 4.0–10.5)

## 2024-01-28 LAB — HEMOGLOBIN A1C: Hgb A1c MFr Bld: 7.3 % — ABNORMAL HIGH (ref 4.6–6.5)

## 2024-01-30 ENCOUNTER — Encounter: Payer: Self-pay | Admitting: Family Medicine

## 2024-01-30 NOTE — Addendum Note (Signed)
 Addended by: WATT RAISIN C on: 01/30/2024 01:04 PM   Modules accepted: Orders

## 2024-02-03 ENCOUNTER — Other Ambulatory Visit: Payer: Self-pay | Admitting: Surgery

## 2024-02-03 DIAGNOSIS — K4090 Unilateral inguinal hernia, without obstruction or gangrene, not specified as recurrent: Secondary | ICD-10-CM | POA: Diagnosis not present

## 2024-02-03 NOTE — Progress Notes (Signed)
 REFERRING PHYSICIAN:  Copland, Harlene Solan* PROVIDER:  VICENTA DASIE POLI, MD MRN: [id number] DOB: 1965-08-09 DATE OF ENCOUNTER: 02/03/2024 Subjective    Chief Complaint: New Consultation   History of Present Illness: Samuel Huffman is a 58 y.o. male who is seen today as an office consultation for evaluation of New Consultation   This is a 58 year old gentleman is referred here for evaluation of a left inguinal hernia.  He noticed the bulge slowly getting larger in the left inguinal area several months ago.  He reports it does occasionally cause some mild discomfort but always easily reduces.  Again, it has been getting slightly larger.  He has had a previous history of cirrhosis and chronic pancreatitis which has resolved.  He no longer drinks alcohol.  He has had a previous open splenectomy for bleeding varices. He has no cardiopulmonary issues and currently is doing very well.    Review of Systems: A complete review of systems was obtained from the patient.  I have reviewed this information and discussed as appropriate with the patient.  See HPI as well for other ROS.  ROS   Medical History: Past Medical History:  Diagnosis Date  . Diabetes mellitus without complication (CMS/HHS-HCC)   . Hypertension     There is no problem list on file for this patient.   Past Surgical History:  Procedure Laterality Date  . LAPAROSCOPIC SPLENECTOMY       No Known Allergies  Current Outpatient Medications on File Prior to Visit  Medication Sig Dispense Refill  . glimepiride  (AMARYL ) 4 MG tablet     . losartan  (COZAAR ) 50 MG tablet     . metFORMIN  (GLUCOPHAGE -XR) 750 MG XR tablet     . multivitamin (THERA TAB) tablet Take 1 tablet by mouth once daily    . pantoprazole  (PROTONIX ) 40 MG DR tablet     . polyethylene glycol (MIRALAX ) packet Take 17 g by mouth once daily Mix in 4-8ounces of fluid prior to taking.     No current facility-administered medications on file prior to  visit.    History reviewed. No pertinent family history.   Social History   Tobacco Use  Smoking Status Some Days  . Types: Cigarettes  Smokeless Tobacco Never     Social History   Socioeconomic History  . Marital status: Divorced  Tobacco Use  . Smoking status: Some Days    Types: Cigarettes  . Smokeless tobacco: Never  Substance and Sexual Activity  . Alcohol use: Yes    Alcohol/week: 0.0 - 1.0 standard drinks of alcohol  . Drug use: Never   Social Drivers of Health   Housing Stability: Unknown (02/03/2024)   Housing Stability Vital Sign   . Homeless in the Last Year: No    Objective:   Vitals:   02/03/24 1535  BP: (!) 140/80  Pulse: (!) 128  Temp: 37.2 C (99 F)  SpO2: 99%  Weight: 92.5 kg (204 lb)  Height: 188 cm (6' 2)  PainSc: 0-No pain    Body mass index is 26.19 kg/m.  Physical Exam   He appears well on exam.  His abdomen is soft and nontender.  He has a moderate-sized, easily reducible, minimally tender left inguinal hernia.  There is no evidence of right inguinal hernia  Labs, Imaging and Diagnostic Testing: I have reviewed the CT scan of his abdomen and pelvis from last year that does show a small left inguinal hernia.  There was no further evidence of  any ascites or cirrhosis.  Assessment and Plan:     Diagnoses and all orders for this visit:  Left inguinal hernia    We discussed abdominal anatomy and hernias.  We discussed conservative management versus hernia repair with mesh.  This is getting larger and symptomatic so repair is strongly recommended to avoid incarceration or strangulation of the bowel.  I discussed both the laparoscopic and open techniques.  We have decided to proceed with an open left inguinal hernia repair with mesh with hopefully an LMA anesthesia and tap block.  I explained the surgical procedure in detail.  We discussed the risks which includes but is not limited to bleeding, infection, injury to surrounding  structures, the use of mesh, nerve entrapment, chronic pain, hernia recurrence, cardiopulmonary issues with anesthesia, etc.  Likely, he would be able to return to his job within 2 weeks postoperatively.  He agrees with the plans.  Surgery will be scheduled  Moderately complex medical decision making    DOUGLAS DASIE POLI, MD

## 2024-02-10 ENCOUNTER — Encounter (HOSPITAL_COMMUNITY): Payer: Self-pay | Admitting: Surgery

## 2024-02-10 ENCOUNTER — Other Ambulatory Visit: Payer: Self-pay

## 2024-02-10 NOTE — Progress Notes (Addendum)
 SDW CALL  Patient was given pre-op instructions over the phone. The opportunity was given for the patient to ask questions. No further questions asked. Patient verbalized understanding of instructions given.   PCP - Harlene Copland Cardiologist - denies  PPM/ICD - denies   Chest x-ray - denies EKG - DOS Stress Test - denies ECHO - denies Cardiac Cath - denies  Sleep Study - denies   Fasting Blood Sugar - 80's Checks Blood Sugar - a couple times a week  Last dose of GLP1 agonist-  n/a GLP1 instructions:  n/a  Blood Thinner Instructions: n/a Aspirin Instructions: n/a  ERAS Protcol - clears until 1030 PRE-SURGERY Ensure or G2- n/a  COVID TEST- n/a   Anesthesia review: yes - hyperkalemia  Patient denies shortness of breath, fever, cough and chest pain over the phone call   All instructions explained to the patient, with a verbal understanding of the material. Patient agrees to go over the instructions while at home for a better understanding.

## 2024-02-11 ENCOUNTER — Encounter (HOSPITAL_COMMUNITY): Payer: Self-pay | Admitting: Surgery

## 2024-02-11 NOTE — Anesthesia Preprocedure Evaluation (Signed)
 Anesthesia Evaluation  Patient identified by MRN, date of birth, ID band Patient awake    Reviewed: Allergy & Precautions, NPO status , Patient's Chart, lab work & pertinent test results  Airway Mallampati: II  TM Distance: >3 FB Neck ROM: Full    Dental  (+) Dental Advisory Given   Pulmonary Current Smoker   breath sounds clear to auscultation       Cardiovascular hypertension, Pt. on medications  Rhythm:Regular Rate:Normal     Neuro/Psych negative neurological ROS     GI/Hepatic hiatal hernia,GERD  ,,(+)     substance abuse (in remission)  alcohol use  Endo/Other  diabetes, Type 2, Oral Hypoglycemic Agents    Renal/GU Renal disease     Musculoskeletal   Abdominal   Peds  Hematology negative hematology ROS (+)   Anesthesia Other Findings   Reproductive/Obstetrics                              Anesthesia Physical Anesthesia Plan  ASA: 3  Anesthesia Plan: General   Post-op Pain Management: Regional block*, Tylenol  PO (pre-op)* and Toradol  IV (intra-op)*   Induction: Intravenous  PONV Risk Score and Plan: 1 and Dexamethasone , Ondansetron  and Treatment may vary due to age or medical condition  Airway Management Planned: LMA  Additional Equipment:   Intra-op Plan:   Post-operative Plan: Extubation in OR  Informed Consent: I have reviewed the patients History and Physical, chart, labs and discussed the procedure including the risks, benefits and alternatives for the proposed anesthesia with the patient or authorized representative who has indicated his/her understanding and acceptance.     Dental advisory given  Plan Discussed with: CRNA  Anesthesia Plan Comments: (See PAT note from 8/19)         Anesthesia Quick Evaluation

## 2024-02-11 NOTE — H&P (Signed)
 REFERRING PHYSICIAN: Copland, Harlene Solan* PROVIDER: VICENTA DASIE POLI, MD MRN: [id number] DOB: 01-Jun-1966 DATE OF ENCOUNTER: 02/03/2024 Subjective   Chief Complaint: New Consultation  History of Present Illness: Samuel Huffman is a 58 y.o. male who is seen today as an office consultation for evaluation of New Consultation  This is a 58 year old gentleman is referred here for evaluation of a left inguinal hernia. He noticed the bulge slowly getting larger in the left inguinal area several months ago. He reports it does occasionally cause some mild discomfort but always easily reduces. Again, it has been getting slightly larger. He has had a previous history of cirrhosis and chronic pancreatitis which has resolved. He no longer drinks alcohol. He has had a previous open splenectomy for bleeding varices. He has no cardiopulmonary issues and currently is doing very well.  Review of Systems: A complete review of systems was obtained from the patient. I have reviewed this information and discussed as appropriate with the patient. See HPI as well for other ROS.  ROS   Medical History: Past Medical History:  Diagnosis Date  Diabetes mellitus without complication (CMS/HHS-HCC)  Hypertension   There is no problem list on file for this patient.  Past Surgical History:  Procedure Laterality Date  LAPAROSCOPIC SPLENECTOMY    No Known Allergies  Current Outpatient Medications on File Prior to Visit  Medication Sig Dispense Refill  glimepiride  (AMARYL ) 4 MG tablet  losartan  (COZAAR ) 50 MG tablet  metFORMIN  (GLUCOPHAGE -XR) 750 MG XR tablet  multivitamin (THERA TAB) tablet Take 1 tablet by mouth once daily  pantoprazole  (PROTONIX ) 40 MG DR tablet  polyethylene glycol (MIRALAX ) packet Take 17 g by mouth once daily Mix in 4-8ounces of fluid prior to taking.   No current facility-administered medications on file prior to visit.   History reviewed. No pertinent family history.    Social History   Tobacco Use  Smoking Status Some Days  Types: Cigarettes  Smokeless Tobacco Never    Social History   Socioeconomic History  Marital status: Divorced  Tobacco Use  Smoking status: Some Days  Types: Cigarettes  Smokeless tobacco: Never  Substance and Sexual Activity  Alcohol use: Yes  Alcohol/week: 0.0 - 1.0 standard drinks of alcohol  Drug use: Never   Social Drivers of Health   Housing Stability: Unknown (02/03/2024)  Housing Stability Vital Sign  Homeless in the Last Year: No   Objective:   Vitals:  02/03/24 1535  BP: (!) 140/80  Pulse: (!) 128  Temp: 37.2 C (99 F)  SpO2: 99%  Weight: 92.5 kg (204 lb)  Height: 188 cm (6' 2)  PainSc: 0-No pain   Body mass index is 26.19 kg/m.  Physical Exam   He appears well on exam.  His abdomen is soft and nontender.  He has a moderate-sized, easily reducible, minimally tender left inguinal hernia. There is no evidence of right inguinal hernia  Labs, Imaging and Diagnostic Testing: I have reviewed the CT scan of his abdomen and pelvis from last year that does show a small left inguinal hernia. There was no further evidence of any ascites or cirrhosis.  Assessment and Plan:   Diagnoses and all orders for this visit:  Left inguinal hernia   We discussed abdominal anatomy and hernias. We discussed conservative management versus hernia repair with mesh. This is getting larger and symptomatic so repair is strongly recommended to avoid incarceration or strangulation of the bowel. I discussed both the laparoscopic and open techniques. We have decided to  proceed with an open left inguinal hernia repair with mesh with hopefully an LMA anesthesia and tap block. I explained the surgical procedure in detail. We discussed the risks which includes but is not limited to bleeding, infection, injury to surrounding structures, the use of mesh, nerve entrapment, chronic pain, hernia recurrence, cardiopulmonary issues  with anesthesia, etc. Likely, he would be able to return to his job within 2 weeks postoperatively. He agrees with the plans. Surgery will be scheduled

## 2024-02-11 NOTE — Progress Notes (Signed)
 Case: [id number] Date/Time: 02/12/24 1315   Procedure: REPAIR, HERNIA, INGUINAL, ADULT (Left) - LMA/TAP BLOCK   Anesthesia type: General   Diagnosis: Left inguinal hernia [K40.90]   Pre-op diagnosis: LEFT INGUINAL HERNIA   [facility] / [facility]   Surgeons: Vernetta Berg, MD       DISCUSSION: Samuel Huffman is a 58 yo male with PMH of current smoking, HTN, GERD, hiatal hernia, cirrhosis, splenic vein thrombosis s/p splenectomy (2018), chronic pancreatitis, gastric varices, DM (A1c 7.3), PTSD, anxiety, hx of ETOH abuse in remission  Seen by PCP on 01/27/24 for chronic issues. Referred to general surgery at that time for inguinal hernia. DM controlled (A1c 7.3). CBC shows macrocytic anemia with hgb of 12.1. PCP has referred to hematology. K 5.7. Will repeat for DOS  VS:  Wt Readings from Last 3 Encounters:  01/27/24 93.4 kg  07/29/23 86.2 kg  02/06/23 83.9 kg   Temp Readings from Last 3 Encounters:  07/29/23 36.6 C (Temporal)  01/23/23 36.8 C (Temporal)  09/11/22 37.1 C   BP Readings from Last 3 Encounters:  01/27/24 (!) 160/95  07/29/23 (!) 150/95  02/06/23 117/73   Pulse Readings from Last 3 Encounters:  01/27/24 90  07/29/23 94  02/06/23 93     PROVIDERS: Copland, Harlene BROCKS, MD   LABS: Repeat    IMAGES:   EKG: Obtain DOS   CV:  Past Medical History:  Diagnosis Date   Acute upper GI bleeding 08/02/2016   Anxiety    Blood transfusion without reported diagnosis    Cirrhosis of liver with ascites (HCC)    Diabetes mellitus without complication (HCC)    GERD (gastroesophageal reflux disease)    Hiatal hernia    History of alcohol abuse    Hypertension    Insomnia    Pancreatic pseudocyst    Pancreatitis    Portal hypertensive gastropathy (HCC)    PTSD (post-traumatic stress disorder)    Pulmonary nodule    Varices, gastric     Past Surgical History:  Procedure Laterality Date   CATARACT EXTRACTION Bilateral     ESOPHAGOGASTRODUODENOSCOPY N/A 12/18/2016   Procedure: ESOPHAGOGASTRODUODENOSCOPY (EGD);  Surgeon: Abran Norleen SAILOR, MD;  Location: [facility];  Service: Endoscopy;  Laterality: N/A;   ESOPHAGOGASTRODUODENOSCOPY (EGD) WITH PROPOFOL  N/A 08/03/2016   Procedure: ESOPHAGOGASTRODUODENOSCOPY (EGD) WITH PROPOFOL ;  Surgeon: Toribio SHAUNNA Cedar, MD;  Location: [facility] ENDOSCOPY;  Service: Endoscopy;  Laterality: N/A;   EUS  08/03/2016   Procedure: UPPER ENDOSCOPIC ULTRASOUND (EUS) LINEAR;  Surgeon: Toribio SHAUNNA Cedar, MD;  Location: [facility] ENDOSCOPY;  Service: Endoscopy;;   FEMUR SURGERY Left    LAPAROSCOPY N/A 12/21/2016   Procedure: LAPAROSCOPY LYSIS OF ADHESIONS, EXPLORATORY LAPAROTOMY, SPLENECTOMY;  Surgeon: Lily Boas, MD;  Location: [facility];  Service: General;  Laterality: N/A;   SPLENECTOMY      MEDICATIONS: No current facility-administered medications for this encounter.    glimepiride  (AMARYL ) 4 MG tablet   losartan  (COZAAR ) 50 MG tablet   metFORMIN  (GLUCOPHAGE -XR) 750 MG 24 hr tablet   Multiple Vitamins-Minerals (MULTIVITAMIN ADULT) CHEW   pantoprazole  (PROTONIX ) 40 MG tablet   Accu-Chek Softclix Lancets lancets   glucose blood (ACCU-CHEK GUIDE) test strip    Burnard CHRISTELLA Senna, PA-C [facility] Surgical Short Stay/Anesthesiology [facility] Phone [phone] 02/11/2024 3:11 PM

## 2024-02-12 ENCOUNTER — Other Ambulatory Visit (HOSPITAL_COMMUNITY): Payer: Self-pay

## 2024-02-12 ENCOUNTER — Ambulatory Visit (HOSPITAL_COMMUNITY): Payer: Self-pay | Admitting: Medical

## 2024-02-12 ENCOUNTER — Encounter (HOSPITAL_COMMUNITY)
Admission: RE | Disposition: A | Discharge status: Home / Self Care | Payer: Self-pay | Source: Home / Self Care | Attending: Surgery

## 2024-02-12 ENCOUNTER — Ambulatory Visit (HOSPITAL_COMMUNITY)
Admission: RE | Admit: 2024-02-12 | Discharge: 2024-02-12 | Disposition: A | Discharge status: Home / Self Care | Attending: Surgery | Admitting: Surgery

## 2024-02-12 ENCOUNTER — Other Ambulatory Visit: Payer: Self-pay

## 2024-02-12 DIAGNOSIS — Z79899 Other long term (current) drug therapy: Secondary | ICD-10-CM | POA: Diagnosis not present

## 2024-02-12 DIAGNOSIS — F17210 Nicotine dependence, cigarettes, uncomplicated: Secondary | ICD-10-CM | POA: Diagnosis not present

## 2024-02-12 DIAGNOSIS — I10 Essential (primary) hypertension: Secondary | ICD-10-CM | POA: Diagnosis not present

## 2024-02-12 DIAGNOSIS — E119 Type 2 diabetes mellitus without complications: Secondary | ICD-10-CM | POA: Insufficient documentation

## 2024-02-12 DIAGNOSIS — K4090 Unilateral inguinal hernia, without obstruction or gangrene, not specified as recurrent: Secondary | ICD-10-CM | POA: Diagnosis not present

## 2024-02-12 DIAGNOSIS — Z7984 Long term (current) use of oral hypoglycemic drugs: Secondary | ICD-10-CM | POA: Diagnosis not present

## 2024-02-12 DIAGNOSIS — K449 Diaphragmatic hernia without obstruction or gangrene: Secondary | ICD-10-CM | POA: Insufficient documentation

## 2024-02-12 DIAGNOSIS — E875 Hyperkalemia: Secondary | ICD-10-CM

## 2024-02-12 DIAGNOSIS — K219 Gastro-esophageal reflux disease without esophagitis: Secondary | ICD-10-CM | POA: Insufficient documentation

## 2024-02-12 DIAGNOSIS — D176 Benign lipomatous neoplasm of spermatic cord: Secondary | ICD-10-CM | POA: Diagnosis not present

## 2024-02-12 DIAGNOSIS — G8918 Other acute postprocedural pain: Secondary | ICD-10-CM | POA: Diagnosis not present

## 2024-02-12 HISTORY — DX: Gastro-esophageal reflux disease without esophagitis: K21.9

## 2024-02-12 HISTORY — PX: INGUINAL HERNIA REPAIR: SHX194

## 2024-02-12 LAB — POCT I-STAT, CHEM 8
BUN: 19 mg/dL (ref 6–20)
Calcium, Ion: 0.94 mmol/L — ABNORMAL LOW (ref 1.15–1.40)
Chloride: 107 mmol/L (ref 98–111)
Creatinine, Ser: 0.8 mg/dL (ref 0.61–1.24)
Glucose, Bld: 113 mg/dL — ABNORMAL HIGH (ref 70–99)
HCT: 39 % (ref 39.0–52.0)
Hemoglobin: 13.3 g/dL (ref 13.0–17.0)
Potassium: 4.8 mmol/L (ref 3.5–5.1)
Sodium: 138 mmol/L (ref 135–145)
TCO2: 22 mmol/L (ref 22–32)

## 2024-02-12 LAB — GLUCOSE, CAPILLARY
Glucose-Capillary: 127 mg/dL — ABNORMAL HIGH (ref 70–99)
Glucose-Capillary: 112 mg/dL — ABNORMAL HIGH (ref 70–99)

## 2024-02-12 SURGERY — REPAIR, HERNIA, INGUINAL, ADULT
Anesthesia: General | Site: Inguinal | Laterality: Left

## 2024-02-12 MED ORDER — ACETAMINOPHEN 500 MG PO TABS
ORAL_TABLET | ORAL | Status: AC
  Administered 2024-02-12: 1000 mg via ORAL
  Filled 2024-02-12: qty 2

## 2024-02-12 MED ORDER — DEXMEDETOMIDINE HCL IN NACL 80 MCG/20ML IV SOLN
INTRAVENOUS | Status: DC | PRN
  Administered 2024-02-12 (×2): 8 ug via INTRAVENOUS

## 2024-02-12 MED ORDER — FENTANYL CITRATE (PF) 100 MCG/2ML IJ SOLN: 50.0000 ug | Freq: Once | INTRAMUSCULAR | Status: DC

## 2024-02-12 MED ORDER — AMISULPRIDE (ANTIEMETIC) 5 MG/2ML IV SOLN: 10.0000 mg | Freq: Once | INTRAVENOUS | Status: DC | PRN

## 2024-02-12 MED ORDER — LACTATED RINGERS IV SOLN: INTRAVENOUS | Status: DC

## 2024-02-12 MED ORDER — INSULIN ASPART 100 UNIT/ML IJ SOLN: 0.0000 [IU] | INTRAMUSCULAR | Status: DC | PRN

## 2024-02-12 MED ORDER — CEFAZOLIN SODIUM-DEXTROSE 2-4 GM/100ML-% IV SOLN
INTRAVENOUS | Status: AC
  Filled 2024-02-12: qty 100

## 2024-02-12 MED ORDER — CLONIDINE HCL (ANALGESIA) 100 MCG/ML EP SOLN
EPIDURAL | Status: DC | PRN
  Administered 2024-02-12: 50 ug

## 2024-02-12 MED ORDER — ORAL CARE MOUTH RINSE: 15.0000 mL | Freq: Once | OROMUCOSAL | Status: AC

## 2024-02-12 MED ORDER — LIDOCAINE 2% (20 MG/ML) 5 ML SYRINGE
INTRAMUSCULAR | Status: DC | PRN
  Administered 2024-02-12: 20 mg via INTRAVENOUS

## 2024-02-12 MED ORDER — FENTANYL CITRATE (PF) 100 MCG/2ML IJ SOLN
INTRAMUSCULAR | Status: AC
  Administered 2024-02-12: 50 ug
  Filled 2024-02-12: qty 2

## 2024-02-12 MED ORDER — ROPIVACAINE HCL 5 MG/ML IJ SOLN
INTRAMUSCULAR | Status: DC | PRN
  Administered 2024-02-12: 30 mL via PERINEURAL

## 2024-02-12 MED ORDER — 0.9 % SODIUM CHLORIDE (POUR BTL) OPTIME
TOPICAL | Status: DC | PRN
  Administered 2024-02-12: 1000 mL

## 2024-02-12 MED ORDER — FENTANYL CITRATE (PF) 250 MCG/5ML IJ SOLN
INTRAMUSCULAR | Status: AC
  Filled 2024-02-12: qty 5

## 2024-02-12 MED ORDER — PROPOFOL 10 MG/ML IV BOLUS
INTRAVENOUS | Status: DC | PRN
  Administered 2024-02-12: 200 mg via INTRAVENOUS

## 2024-02-12 MED ORDER — OXYCODONE HCL 5 MG PO TABS
5.0000 mg | ORAL_TABLET | Freq: Four times a day (QID) | ORAL | 0 refills | Status: AC | PRN
  Filled 2024-02-12: qty 20, 5d supply, fill #0

## 2024-02-12 MED ORDER — BUPIVACAINE-EPINEPHRINE (PF) 0.5% -1:200000 IJ SOLN
INTRAMUSCULAR | Status: AC
  Filled 2024-02-12: qty 30

## 2024-02-12 MED ORDER — ENSURE PRE-SURGERY PO LIQD: 296.0000 mL | Freq: Once | ORAL | Status: DC

## 2024-02-12 MED ORDER — CHLORHEXIDINE GLUCONATE 0.12 % MT SOLN: 15.0000 mL | Freq: Once | OROMUCOSAL | Status: AC

## 2024-02-12 MED ORDER — FENTANYL CITRATE (PF) 100 MCG/2ML IJ SOLN
INTRAMUSCULAR | Status: AC
  Filled 2024-02-12: qty 2

## 2024-02-12 MED ORDER — MIDAZOLAM HCL 2 MG/2ML IJ SOLN
INTRAMUSCULAR | Status: DC | PRN
  Administered 2024-02-12: 2 mg via INTRAVENOUS

## 2024-02-12 MED ORDER — MIDAZOLAM HCL 2 MG/2ML IJ SOLN
INTRAMUSCULAR | Status: AC
  Administered 2024-02-12: 2 mg via INTRAVENOUS
  Filled 2024-02-12: qty 2

## 2024-02-12 MED ORDER — MIDAZOLAM HCL 2 MG/2ML IJ SOLN
INTRAMUSCULAR | Status: AC
  Filled 2024-02-12: qty 2

## 2024-02-12 MED ORDER — FENTANYL CITRATE (PF) 100 MCG/2ML IJ SOLN
25.0000 ug | INTRAMUSCULAR | Status: DC | PRN
  Administered 2024-02-12 (×2): 50 ug via INTRAVENOUS

## 2024-02-12 MED ORDER — ACETAMINOPHEN 500 MG PO TABS: 1000.0000 mg | ORAL_TABLET | ORAL | Status: AC

## 2024-02-12 MED ORDER — OXYCODONE HCL 5 MG/5ML PO SOLN: 5.0000 mg | Freq: Once | ORAL | Status: DC | PRN

## 2024-02-12 MED ORDER — CHLORHEXIDINE GLUCONATE CLOTH 2 % EX PADS: 6.0000 | MEDICATED_PAD | Freq: Once | CUTANEOUS | Status: DC

## 2024-02-12 MED ORDER — BUPIVACAINE-EPINEPHRINE 0.5% -1:200000 IJ SOLN
INTRAMUSCULAR | Status: DC | PRN
  Administered 2024-02-12: 10 mL

## 2024-02-12 MED ORDER — OXYCODONE HCL 5 MG PO TABS: 5.0000 mg | ORAL_TABLET | Freq: Once | ORAL | Status: DC | PRN

## 2024-02-12 MED ORDER — MIDAZOLAM HCL 2 MG/2ML IJ SOLN: 2.0000 mg | Freq: Once | INTRAMUSCULAR | Status: AC

## 2024-02-12 MED ORDER — CEFAZOLIN SODIUM-DEXTROSE 2-4 GM/100ML-% IV SOLN
2.0000 g | INTRAVENOUS | Status: AC
  Administered 2024-02-12: 2 g via INTRAVENOUS

## 2024-02-12 MED ORDER — DEXAMETHASONE SODIUM PHOSPHATE 10 MG/ML IJ SOLN
INTRAMUSCULAR | Status: DC | PRN
  Administered 2024-02-12: 8 mg via INTRAVENOUS

## 2024-02-12 MED ORDER — KETOROLAC TROMETHAMINE 30 MG/ML IJ SOLN
INTRAMUSCULAR | Status: DC | PRN
  Administered 2024-02-12: 30 mg via INTRAVENOUS

## 2024-02-12 MED ORDER — FENTANYL CITRATE (PF) 250 MCG/5ML IJ SOLN
INTRAMUSCULAR | Status: DC | PRN
  Administered 2024-02-12: 25 ug via INTRAVENOUS

## 2024-02-12 MED ORDER — ONDANSETRON HCL 4 MG/2ML IJ SOLN
INTRAMUSCULAR | Status: DC | PRN
  Administered 2024-02-12: 4 mg via INTRAVENOUS

## 2024-02-12 MED ORDER — CHLORHEXIDINE GLUCONATE 0.12 % MT SOLN
OROMUCOSAL | Status: AC
  Administered 2024-02-12: 15 mL via OROMUCOSAL
  Filled 2024-02-12: qty 15

## 2024-02-12 SURGICAL SUPPLY — 27 items
BAG COUNTER SPONGE SURGICOUNT (BAG) IMPLANT
BLADE CLIPPER SURG (BLADE) IMPLANT
CHLORAPREP W/TINT 26 (MISCELLANEOUS) ×1 IMPLANT
COVER SURGICAL LIGHT HANDLE (MISCELLANEOUS) ×1 IMPLANT
DERMABOND ADVANCED .7 DNX12 (GAUZE/BANDAGES/DRESSINGS) ×1 IMPLANT
DRAIN PENROSE .5X12 LATEX STL (DRAIN) IMPLANT
DRAPE LAPAROTOMY TRNSV 102X78 (DRAPES) ×1 IMPLANT
ELECTRODE REM PT RTRN 9FT ADLT (ELECTROSURGICAL) ×1 IMPLANT
GLOVE SURG SIGNA 7.5 PF LTX (GLOVE) ×1 IMPLANT
GOWN STRL REUS W/ TWL LRG LVL3 (GOWN DISPOSABLE) ×1 IMPLANT
GOWN STRL REUS W/ TWL XL LVL3 (GOWN DISPOSABLE) ×1 IMPLANT
KIT BASIN OR (CUSTOM PROCEDURE TRAY) ×1 IMPLANT
KIT TURNOVER KIT B (KITS) ×1 IMPLANT
MESH PARIETEX PROGRIP LEFT (Mesh General) IMPLANT
NDL HYPO 25GX1X1/2 BEV (NEEDLE) ×1 IMPLANT
NEEDLE HYPO 25GX1X1/2 BEV (NEEDLE) ×1 IMPLANT
NS IRRIG 1000ML POUR BTL (IV SOLUTION) ×1 IMPLANT
PACK GENERAL/GYN (CUSTOM PROCEDURE TRAY) ×1 IMPLANT
PAD ARMBOARD POSITIONER FOAM (MISCELLANEOUS) ×1 IMPLANT
SUT MON AB 4-0 PC3 18 (SUTURE) ×1 IMPLANT
SUT SILK 2 0 SH (SUTURE) IMPLANT
SUT VIC AB 2-0 CT1 TAPERPNT 27 (SUTURE) ×1 IMPLANT
SUT VIC AB 3-0 CT1 27XBRD (SUTURE) ×1 IMPLANT
SUT VIC AB 3-0 SH 27X BRD (SUTURE) IMPLANT
SYR CONTROL 10ML LL (SYRINGE) ×1 IMPLANT
TOWEL GREEN STERILE (TOWEL DISPOSABLE) ×1 IMPLANT
TOWEL GREEN STERILE FF (TOWEL DISPOSABLE) ×1 IMPLANT

## 2024-02-12 NOTE — Discharge Instructions (Signed)
 [facility]  UMBILICAL OR INGUINAL HERNIA REPAIR: POST OP INSTRUCTIONS  Always review your discharge instruction sheet given to you by the facility where your surgery was performed. IF YOU HAVE DISABILITY OR FAMILY LEAVE FORMS, YOU MUST BRING THEM TO THE OFFICE FOR PROCESSING.   DO NOT GIVE THEM TO YOUR DOCTOR.  1. A  prescription for pain medication may be given to you upon discharge.  Take your pain medication as prescribed, if needed.  If narcotic pain medicine is not needed, then you may take acetaminophen  (Tylenol ) or ibuprofen (Advil) as needed. 2. Take your usually prescribed medications unless otherwise directed. If you need a refill on your pain medication, please contact your pharmacy.  They will contact our office to request authorization. Prescriptions will not be filled after 5 pm or on week-ends. 3. You should follow a light diet the first 24 hours after arrival home, such as soup and crackers, etc.  Be sure to include lots of fluids daily.  Resume your normal diet the day after surgery. 4.Most patients will experience some swelling and bruising around the umbilicus or in the groin and scrotum.  Ice packs and reclining will help.  Swelling and bruising can take several days to resolve.  6. It is common to experience some constipation if taking pain medication after surgery.  Increasing fluid intake and taking a stool softener (such as Colace) will usually help or prevent this problem from occurring.  A mild laxative (Milk of Magnesia or Miralax ) should be taken according to package directions if there are no bowel movements after 48 hours. 7. Unless discharge instructions indicate otherwise, you may remove your bandages 24-48 hours after surgery, and you may shower at that time.  You may have steri-strips (small skin tapes) in place directly over the incision.  These strips should be left on the skin for 7-10 days.  If your surgeon used skin glue on the  incision, you may shower in 24 hours.  The glue will flake off over the next 2-3 weeks.  Any sutures or staples will be removed at the office during your follow-up visit. 8. ACTIVITIES:  You may resume regular (light) daily activities beginning the next day--such as daily self-care, walking, climbing stairs--gradually increasing activities as tolerated.  You may have sexual intercourse when it is comfortable.  Refrain from any heavy lifting or straining until approved by your doctor.  a.You may drive when you are no longer taking prescription pain medication, you can comfortably wear a seatbelt, and you can safely maneuver your car and apply brakes. b.RETURN TO WORK:  2 WEEKS _____________________________________________  9.You should see your doctor in the office for a follow-up appointment approximately 2-3 weeks after your surgery.  Make sure that you call for this appointment within a day or two after you arrive home to insure a convenient appointment time. 10.OTHER INSTRUCTIONS: YOU MAY SHOWER STARTING TOMORROW ICE PACK, TYLENOL , AND IBUPROFEN ALSO FOR PAIN NO LIFTING MORE THAN 15 POUNDS FOR 4 WEEKS_________________________    _____________________________________  WHEN TO CALL YOUR DOCTOR: Fever over 101.0 Inability to urinate Nausea and/or vomiting Extreme swelling or bruising Continued bleeding from incision. Increased pain, redness, or drainage from the incision  The clinic staff is available to answer your questions during regular business hours.  Please don't hesitate to call and ask to speak to one of the nurses for clinical concerns.  If you have a medical emergency, go to the nearest emergency room or call 911.  A surgeon from [facility] is always on call at the hospital   [address], [address], [place], [place]  [address] ?  [address] [address], [place], [place]   [address] [phone] ? [phone] ? FAX [phone] Web site:  [email or web address]

## 2024-02-12 NOTE — Op Note (Signed)
   Jlon T Payeur 02/12/2024   Pre-op Diagnosis: LEFT INGUINAL HERNIA     Post-op Diagnosis: same  Procedure(s): OPEN LEFT INGUINAL HERNIA REPAIR WITH MESH  Surgeon(s): Vernetta Berg, MD  Anesthesia: General  Staff:  Circulator: Storm Maryelizabeth CROME, RN; Lawernce Suzen LABOR, RN Scrub Person: Shona Jon POUR, RN; Lawernce Mariel RAMAN, RN Circulator Assistant: Deette Thedora LABOR, RN  Estimated Blood Loss: Minimal               Findings: The patient was found to have an indirect left inguinal hernia which was repaired with a large piece of Prolene ProGrip mesh from [employer]  Procedure: The patient was brought to the operating and identified the correct patient.  He was placed upon the operating table and general anesthesia was induced.  His abdomen was prepped and draped in the usual sterile fashion.  I anesthetized skin the left inguinal area with Marcaine .  I then made a longitudinal incision with a scalpel.  We dissected down through Scarpa's fascia with the cautery.  We then identified the external oblique fascia and opened it toward the internal and external rings.  The testicular cord and structures were then controlled with a Penrose drain.  The patient was found to have a large indirect hernia sac as well as a lipoma of the cord.  I dissected the lipoma off the cord and tied off the base of it with a 2-0 silk suture.  I then dissected the hernia sac off the cord structures down to the internal ring.  All contents have been reduced.  I tied off the base of the sac with a 2-0 silk suture and then excised the redundant sac.  Next a large piece of Prolene ProGrip mesh from [employer] was brought to the field.  I placed the mesh against the pubic tubercle and then brought around the internal ring and cord structures and back to the tubercle.  I then sutured the mesh in place to the pubic tubercle with 2 separate 2-0 Vicryl sutures.  Wide coverage of the inguinal floor and internal ring appeared to be  achieved.  Hemostasis appeared to be achieved.  I closed the external oblique fascia over the top of the mesh with a running 2-0 Vicryl suture.  Scarpa's fascia was then closed with interrupted 3-0 Vicryl sutures and the skin was closed with a running 4-0 Monocryl.  Dermabond was then applied.  The patient tolerated the procedure well.  All the counts were correct at the end of the procedure.  The patient was then extubated in the operating room and taken in a stable condition to the recovery room.          Berg Vernetta   Date: 02/12/2024  Time: 1:33 PM

## 2024-02-12 NOTE — Interval H&P Note (Signed)
 History and Physical Interval Note:no change in H and P  02/12/2024 12:23 PM  Samuel Huffman  has presented today for surgery, with the diagnosis of LEFT INGUINAL HERNIA.  The various methods of treatment have been discussed with the patient and family. After consideration of risks, benefits and other options for treatment, the patient has consented to  Procedure(s) with comments: REPAIR, HERNIA, INGUINAL, ADULT (Left) - LMA/TAP BLOCK as a surgical intervention.  The patient's history has been reviewed, patient examined, no change in status, stable for surgery.  I have reviewed the patient's chart and labs.  Questions were answered to the patient's satisfaction.     Samuel Huffman

## 2024-02-12 NOTE — Anesthesia Procedure Notes (Signed)
 Procedure Name: LMA Insertion Date/Time: 02/12/2024 12:58 PM  Performed by: Boyce Shilling, CRNAPre-anesthesia Checklist: Patient identified, Emergency Drugs available, Suction available, Timeout performed and Patient being monitored Patient Re-evaluated:Patient Re-evaluated prior to induction Oxygen Delivery Method: Circle system utilized Preoxygenation: Pre-oxygenation with 100% oxygen Induction Type: IV induction Ventilation: Mask ventilation without difficulty LMA: LMA inserted LMA Size: 4.0 Tube type: Oral Number of attempts: 1 Placement Confirmation: positive ETCO2, CO2 detector and breath sounds checked- equal and bilateral Tube secured with: Tape Dental Injury: Teeth and Oropharynx as per pre-operative assessment

## 2024-02-12 NOTE — Anesthesia Procedure Notes (Signed)
 Anesthesia Regional Block: TAP block   Pre-Anesthetic Checklist: , timeout performed,  Correct Patient, Correct Site, Correct Laterality,  Correct Procedure, Correct Position, site marked,  Risks and benefits discussed,  Surgical consent,  Pre-op evaluation,  At surgeon's request and post-op pain management  Laterality: Left  Prep: chloraprep       Needles:  Injection technique: Single-shot  Needle Type: Echogenic Needle     Needle Length: 9cm  Needle Gauge: 21     Additional Needles:   Procedures:,,,, ultrasound used (permanent image in chart),,    Narrative:  Start time: 02/12/2024 12:37 PM End time: 02/12/2024 12:45 PM Injection made incrementally with aspirations every 5 mL.  Performed by: Personally  Anesthesiologist: Epifanio Charleston, MD

## 2024-02-12 NOTE — Transfer of Care (Signed)
 Immediate Anesthesia Transfer of Care Note  Patient: Samuel Huffman  Procedure(s) Performed: REPAIR, HERNIA, INGUINAL, ADULT W/MESH (Left: Inguinal)  Patient Location: PACU  Anesthesia Type:GA combined with regional for post-op pain  Level of Consciousness: awake and drowsy  Airway & Oxygen Therapy: Patient Spontanous Breathing and Patient connected to face mask oxygen  Post-op Assessment: Report given to RN and Post -op Vital signs reviewed and stable  Post vital signs: Reviewed and stable  Last Vitals:  Vitals Value Taken Time  BP 138/92 02/12/24 13:54  Temp    Pulse 75 02/12/24 13:56  Resp 13 02/12/24 13:56  SpO2 96 % 02/12/24 13:56  Vitals shown include unfiled device data.  Last Pain:  Vitals:   02/12/24 1228  TempSrc:   PainSc: 0-No pain         Complications: No notable events documented.

## 2024-02-12 NOTE — Anesthesia Postprocedure Evaluation (Signed)
 Anesthesia Post Note  Patient: Cedrick T Grindstaff  Procedure(s) Performed: REPAIR, HERNIA, INGUINAL, ADULT W/MESH (Left: Inguinal)     Patient location during evaluation: PACU Anesthesia Type: General Level of consciousness: awake and alert Pain management: pain level controlled Vital Signs Assessment: post-procedure vital signs reviewed and stable Respiratory status: spontaneous breathing, nonlabored ventilation, respiratory function stable and patient connected to nasal cannula oxygen Cardiovascular status: blood pressure returned to baseline and stable Postop Assessment: no apparent nausea or vomiting Anesthetic complications: no   No notable events documented.  Last Vitals:  Vitals:   02/12/24 1415 02/12/24 1430  BP: (!) 140/87 (!) 152/92  Pulse: 69 66  Resp: 14 20  Temp:  36.7 C  SpO2: 95% 94%    Last Pain:  Vitals:   02/12/24 1430  TempSrc:   PainSc: 3                  Epifanio Lamar BRAVO

## 2024-02-13 ENCOUNTER — Encounter (HOSPITAL_COMMUNITY): Payer: Self-pay | Admitting: Surgery

## 2024-04-12 ENCOUNTER — Emergency Department (HOSPITAL_BASED_OUTPATIENT_CLINIC_OR_DEPARTMENT_OTHER): Admission: EM | Admit: 2024-04-12 | Discharge: 2024-04-12 | Discharge status: Against Medical Advice

## 2024-04-12 ENCOUNTER — Other Ambulatory Visit: Payer: Self-pay

## 2024-04-30 ENCOUNTER — Other Ambulatory Visit: Payer: Self-pay | Admitting: Family Medicine

## 2024-04-30 DIAGNOSIS — E118 Type 2 diabetes mellitus with unspecified complications: Secondary | ICD-10-CM

## 2024-04-30 DIAGNOSIS — K219 Gastro-esophageal reflux disease without esophagitis: Secondary | ICD-10-CM

## 2024-04-30 NOTE — Telephone Encounter (Unsigned)
 Copied from CRM #[id number]. Topic: Clinical - Medication Refill >> Apr 30, 2024 12:20 PM Leah W wrote: Medication:  glimepiride  (AMARYL ) 4 MG tablet   Has the patient contacted their pharmacy? Yes (Agent: If no, request that the patient contact the pharmacy for the refill. If patient does not wish to contact the pharmacy document the reason why and proceed with request.) (Agent: If yes, when and what did the pharmacy advise?)  This is the patient's preferred pharmacy:  [id number], [place] - [address] AT [facility] [address] [place] [address] Phone: [phone] Fax: [phone]  [facility] [place] [facility] [address] [place] [address] Phone: [phone] Fax: [phone]  [facility] [phone] - [place], [place] - [address] AT [facility] [address] [place] [address] Phone: [phone] Fax: [phone]  [facility] [address] [place] [address] Phone: [phone] Fax: [phone]  Is this the correct pharmacy for this prescription? Yes If no, delete pharmacy and type the correct one.   Has the prescription been filled recently? No  Is the patient out of the medication? Yes  Has the patient been seen for an appointment in the last year OR does the patient have an upcoming appointment? Yes  Can we respond through MyChart? No  Agent: Please be advised that Rx refills may take up to 3 business days. We ask that you follow-up with your pharmacy.

## 2024-05-01 MED ORDER — GLIMEPIRIDE 4 MG PO TABS: 4.0000 mg | ORAL_TABLET | Freq: Every day | ORAL | 1 refills | Status: AC

## 2024-05-02 ENCOUNTER — Other Ambulatory Visit: Payer: Self-pay | Admitting: Family Medicine

## 2024-05-02 DIAGNOSIS — E118 Type 2 diabetes mellitus with unspecified complications: Secondary | ICD-10-CM

## 2024-06-27 ENCOUNTER — Other Ambulatory Visit: Payer: Self-pay | Admitting: Family Medicine

## 2024-06-27 DIAGNOSIS — E118 Type 2 diabetes mellitus with unspecified complications: Secondary | ICD-10-CM

## 2024-07-25 ENCOUNTER — Other Ambulatory Visit: Payer: Self-pay | Admitting: Family Medicine

## 2024-07-25 DIAGNOSIS — K219 Gastro-esophageal reflux disease without esophagitis: Secondary | ICD-10-CM

## 2024-07-29 ENCOUNTER — Ambulatory Visit: Admitting: Family Medicine

## 2024-07-29 DIAGNOSIS — E118 Type 2 diabetes mellitus with unspecified complications: Secondary | ICD-10-CM

## 2024-07-29 DIAGNOSIS — Z125 Encounter for screening for malignant neoplasm of prostate: Secondary | ICD-10-CM

## 2024-07-29 DIAGNOSIS — Z13220 Encounter for screening for lipoid disorders: Secondary | ICD-10-CM

## 2024-07-29 DIAGNOSIS — D7589 Other specified diseases of blood and blood-forming organs: Secondary | ICD-10-CM

## 2024-07-29 DIAGNOSIS — Z9081 Acquired absence of spleen: Secondary | ICD-10-CM

## 2024-07-29 DIAGNOSIS — D539 Nutritional anemia, unspecified: Secondary | ICD-10-CM
